# Patient Record
Sex: Female | Born: 2000 | Race: Black or African American | Hispanic: No | Marital: Single | Smoking: Never smoker
Health system: Southern US, Community
[De-identification: ages and names within clinical notes are randomized; demographics above are authoritative.]

## PROBLEM LIST (undated history)

## (undated) ENCOUNTER — Inpatient Hospital Stay (HOSPITAL_COMMUNITY): Payer: Self-pay

## (undated) ENCOUNTER — Ambulatory Visit: Source: Home / Self Care

## (undated) DIAGNOSIS — F32A Depression, unspecified: Secondary | ICD-10-CM

## (undated) DIAGNOSIS — Z789 Other specified health status: Secondary | ICD-10-CM

## (undated) DIAGNOSIS — F329 Major depressive disorder, single episode, unspecified: Secondary | ICD-10-CM

## (undated) DIAGNOSIS — H3090 Unspecified chorioretinal inflammation, unspecified eye: Secondary | ICD-10-CM

## (undated) DIAGNOSIS — O24419 Gestational diabetes mellitus in pregnancy, unspecified control: Secondary | ICD-10-CM

## (undated) DIAGNOSIS — F3181 Bipolar II disorder: Secondary | ICD-10-CM

## (undated) DIAGNOSIS — F419 Anxiety disorder, unspecified: Secondary | ICD-10-CM

## (undated) DIAGNOSIS — D649 Anemia, unspecified: Secondary | ICD-10-CM

## (undated) HISTORY — DX: Anemia, unspecified: D64.9

## (undated) HISTORY — DX: Depression, unspecified: F32.A

## (undated) HISTORY — PX: INDUCED ABORTION: SHX677

## (undated) HISTORY — PX: REFRACTIVE SURGERY: SHX103

## (undated) HISTORY — DX: Unspecified chorioretinal inflammation, unspecified eye: H30.90

## (undated) HISTORY — DX: Gestational diabetes mellitus in pregnancy, unspecified control: O24.419

## (undated) NOTE — *Deleted (*Deleted)
OB/GYN Faculty Practice Delivery Note  Kim Crosby is a 14 y.o. G1P0 s/p induced vaginal at [redacted]w[redacted]d. She was admitted for IOL for A1GDM.   GBS Status:   Maximum Maternal Temperature: Temp (24hrs), Avg:98.7 F (37.1 C), Min:97.5 F (36.4 C), Max:100.8 F (38.2 C)  Labor Progress: . Admitted for induction of labor.  . AROM 5h 74m prior to delivery with light meconium  . Complete dilation achieved.   Delivery Date/Time: 10/31/2019 at 0430 Delivery: Called to room and patient was complete and pushing. Head delivered ***.  No nuchal cord present. . Shoulder and body delivered in usual fashion. Infant with spontaneous cry, placed skin to skin on mother's abdomen, dried and stimulated. Cord clamped x 2 after 1-minute delay, and cut by FOB. Cord blood drawn. Placenta delivered spontaneously with gentle cord traction. Fundus firm with massage and Pitocin. Labia, perineum, vagina, and cervix inspected with bilateral labial. Left labial laceration requiring repair with 4-0 monocryl for hemostasis.  .   Placenta: spontaneous , intact  Complications: maternal temperature, Triple I  EBL: *** mL  Analgesia: Epidural anesthesia  Postpartum Planning Mom and baby to mother/baby.  . Lactation consult . Contraception undecided   . Circ: yes, in hospital   . AM CBG PPD #1  Infant: Viable baby boy  APGAR: 9/9  see delivery summary for infant birthweight  Genia Hotter, M.D.  10/31/2019 4:39 AM

---
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
## 2019-01-14 NOTE — L&D Delivery Note (Signed)
OB/GYN Faculty Practice Delivery Note  Kim Crosby is a 19 y.o. G1P0 s/p induced vaginal at [redacted]w[redacted]d. She was admitted for IOL for A1GDM.   GBS Status:   Maximum Maternal Temperature: Temp (24hrs), Avg:98.7 F (37.1 C), Min:97.5 F (36.4 C), Max:100.8 F (38.2 C)  Labor Progress: . Admitted for induction of labor.  . AROM 5h 16m prior to delivery with light meconium  . Complete dilation achieved.   Delivery Date/Time: 10/31/2019 at 0430 Delivery: Called to room and patient was complete and pushing. Head delivered LOA.  No nuchal cord present. . Shoulder and body delivered in usual fashion. Infant with spontaneous cry, placed skin to skin on mother's abdomen, dried and stimulated. Cord clamped x 2 after 1-minute delay, and cut by FOB. Cord blood drawn. Placenta delivered spontaneously with gentle cord traction. Fundus firm with massage and Pitocin. Labia, perineum, vagina, and cervix inspected with bilateral labial. Left labial laceration requiring repair with 4-0 monocryl for hemostasis.  .   Placenta: spontaneous , intact  Complications: maternal temperature, Triple I  EBL: 150 mL  Analgesia: Epidural anesthesia  Postpartum Planning Mom and baby to mother/baby.  . Lactation consult . Contraception undecided   . Circ: yes, in hospital   . AM CBG PPD #1  Infant: Viable baby boy  APGAR: 9/9  see delivery summary for infant birthweight  Wende Mott, [place] 10/31/19 4:54 AM

## 2019-03-02 ENCOUNTER — Inpatient Hospital Stay (HOSPITAL_COMMUNITY)
Admission: AD | Admit: 2019-03-02 | Discharge: 2019-03-02 | Disposition: A | Discharge status: Home / Self Care | Payer: Self-pay | Attending: Obstetrics and Gynecology | Admitting: Obstetrics and Gynecology

## 2019-03-02 ENCOUNTER — Encounter (HOSPITAL_COMMUNITY): Payer: Self-pay | Admitting: *Deleted

## 2019-03-02 ENCOUNTER — Inpatient Hospital Stay (HOSPITAL_COMMUNITY): Payer: Self-pay

## 2019-03-02 ENCOUNTER — Other Ambulatory Visit: Payer: Self-pay

## 2019-03-02 DIAGNOSIS — O4690 Antepartum hemorrhage, unspecified, unspecified trimester: Secondary | ICD-10-CM

## 2019-03-02 DIAGNOSIS — Z3490 Encounter for supervision of normal pregnancy, unspecified, unspecified trimester: Secondary | ICD-10-CM

## 2019-03-02 DIAGNOSIS — Z6790 Unspecified blood type, Rh positive: Secondary | ICD-10-CM | POA: Insufficient documentation

## 2019-03-02 DIAGNOSIS — D75839 Thrombocytosis, unspecified: Secondary | ICD-10-CM

## 2019-03-02 DIAGNOSIS — D473 Essential (hemorrhagic) thrombocythemia: Secondary | ICD-10-CM

## 2019-03-02 DIAGNOSIS — O468X1 Other antepartum hemorrhage, first trimester: Secondary | ICD-10-CM

## 2019-03-02 DIAGNOSIS — O418X10 Other specified disorders of amniotic fluid and membranes, first trimester, not applicable or unspecified: Secondary | ICD-10-CM

## 2019-03-02 DIAGNOSIS — O99341 Other mental disorders complicating pregnancy, first trimester: Secondary | ICD-10-CM | POA: Insufficient documentation

## 2019-03-02 DIAGNOSIS — O208 Other hemorrhage in early pregnancy: Secondary | ICD-10-CM | POA: Insufficient documentation

## 2019-03-02 DIAGNOSIS — F3181 Bipolar II disorder: Secondary | ICD-10-CM | POA: Insufficient documentation

## 2019-03-02 DIAGNOSIS — Z79899 Other long term (current) drug therapy: Secondary | ICD-10-CM | POA: Insufficient documentation

## 2019-03-02 DIAGNOSIS — R7989 Other specified abnormal findings of blood chemistry: Secondary | ICD-10-CM | POA: Insufficient documentation

## 2019-03-02 DIAGNOSIS — O26891 Other specified pregnancy related conditions, first trimester: Secondary | ICD-10-CM | POA: Insufficient documentation

## 2019-03-02 DIAGNOSIS — O99111 Other diseases of the blood and blood-forming organs and certain disorders involving the immune mechanism complicating pregnancy, first trimester: Secondary | ICD-10-CM | POA: Insufficient documentation

## 2019-03-02 DIAGNOSIS — Z3A01 Less than 8 weeks gestation of pregnancy: Secondary | ICD-10-CM | POA: Insufficient documentation

## 2019-03-02 HISTORY — DX: Other specified health status: Z78.9

## 2019-03-02 HISTORY — DX: Bipolar II disorder: F31.81

## 2019-03-02 HISTORY — DX: Anxiety disorder, unspecified: F41.9

## 2019-03-02 LAB — COMPREHENSIVE METABOLIC PANEL
ALT: 23 U/L (ref 0–44)
AST: 23 U/L (ref 15–41)
Albumin: 3.7 g/dL (ref 3.5–5.0)
Alkaline Phosphatase: 50 U/L (ref 38–126)
Anion gap: 9 (ref 5–15)
BUN: 9 mg/dL (ref 6–20)
CO2: 24 mmol/L (ref 22–32)
Calcium: 9.2 mg/dL (ref 8.9–10.3)
Chloride: 104 mmol/L (ref 98–111)
Creatinine, Ser: 0.61 mg/dL (ref 0.44–1.00)
GFR calc Af Amer: >60 mL/min (ref >60)
GFR calc non Af Amer: >60 mL/min (ref >60)
Glucose, Bld: 85 mg/dL (ref 70–99)
Potassium: 3.7 mmol/L (ref 3.5–5.1)
Sodium: 137 mmol/L (ref 135–145)
Total Bilirubin: 0.5 mg/dL (ref 0.3–1.2)
Total Protein: 6.8 g/dL (ref 6.5–8.1)

## 2019-03-02 LAB — URINALYSIS, ROUTINE W REFLEX MICROSCOPIC
Bilirubin Urine: NEGATIVE
Glucose, UA: NEGATIVE mg/dL
Hgb urine dipstick: NEGATIVE
Ketones, ur: NEGATIVE mg/dL
Leukocytes,Ua: NEGATIVE
Nitrite: NEGATIVE
Protein, ur: NEGATIVE mg/dL
Specific Gravity, Urine: 1.023 (ref 1.005–1.030)
pH: 6 (ref 5.0–8.0)

## 2019-03-02 LAB — CBC
HCT: 37.4 % (ref 36.0–46.0)
Hemoglobin: 12.2 g/dL (ref 12.0–15.0)
MCH: 27.1 pg (ref 26.0–34.0)
MCHC: 32.6 g/dL (ref 30.0–36.0)
MCV: 83.1 fL (ref 80.0–100.0)
Platelets: 410 10*3/uL — ABNORMAL HIGH (ref 150–400)
RBC: 4.5 MIL/uL (ref 3.87–5.11)
RDW: 15.6 % — ABNORMAL HIGH (ref 11.5–15.5)
WBC: 9.6 10*3/uL (ref 4.0–10.5)
nRBC: 0 % (ref 0.0–0.2)

## 2019-03-02 LAB — HCG, QUANTITATIVE, PREGNANCY: hCG, Beta Chain, Quant, S: 19324 m[IU]/mL — ABNORMAL HIGH

## 2019-03-02 LAB — WET PREP, GENITAL
Sperm: NONE SEEN
Trich, Wet Prep: NONE SEEN
Yeast Wet Prep HPF POC: NONE SEEN

## 2019-03-02 LAB — POCT PREGNANCY, URINE: Preg Test, Ur: POSITIVE — AB

## 2019-03-02 LAB — ABO/RH: ABO/RH(D): B POS

## 2019-03-02 NOTE — MAU Provider Note (Signed)
History     CSN: [id number]  Arrival date and time: 03/02/19 1509   First Provider Initiated Contact with Patient 03/02/19 1555      Chief Complaint  Patient presents with  . Vaginal Bleeding  . Abdominal Pain   Ms. Kim Crosby is a 19 y.o. G1P0 at Unknown who presents to MAU for vaginal bleeding which began 3 days ago. Pt describes bleeding as less than her period and only occurred when wiping, but patient reports she is now using a pad, but has a very minimal amount of bleeding on her pad.  Passing blood clots? no Blood soaking clothes? no Lightheaded/dizzy? no Significant pelvic pain or cramping? present, but less than menstrual cramps Passed any tissue? no  Current pregnancy problems? pt has not yet been seen Blood Type? unknown Allergies? NKDA Current medications? none Current PNC & next appt? [facility], NOB 03/16/2019  Pt denies vaginal discharge/odor/itching. Pt denies N/V, abdominal pain, constipation, diarrhea, or urinary problems. Pt denies fever, chills, fatigue, sweating or changes in appetite. Pt denies SOB or chest pain. Pt denies dizziness, HA, light-headedness, weakness.   OB History    Gravida  1   Para      Term      Preterm      AB      Living        SAB      TAB      Ectopic      Multiple      Live Births              Past Medical History:  Diagnosis Date  . Anxiety   . Bipolar 2 disorder ([place])   . Medical history non-contributory     Family History  Problem Relation Age of Onset  . Asthma Father     Social History   Tobacco Use  . Smoking status: Never Smoker  . Smokeless tobacco: Never Used  Substance Use Topics  . Alcohol use: No  . Drug use: Not Currently    Types: Marijuana    Allergies: No Known Allergies  Medications Prior to Admission  Medication Sig Dispense Refill Last Dose  . loratadine (CLARITIN) 10 MG tablet Take 1 tablet (10 mg total) by mouth daily. 90 tablet 3     Review of Systems   Constitutional: Negative for chills, diaphoresis, fatigue and fever.  Eyes: Negative for visual disturbance.  Respiratory: Negative for shortness of breath.   Cardiovascular: Negative for chest pain.  Gastrointestinal: Negative for abdominal pain, constipation, diarrhea, nausea and vomiting.  Genitourinary: Positive for pelvic pain and vaginal bleeding. Negative for dysuria, flank pain, frequency, urgency and vaginal discharge.  Neurological: Negative for dizziness, weakness, light-headedness and headaches.   Physical Exam   Blood pressure 118/61, pulse 100, temperature 98.8 F (37.1 C), temperature source Oral, resp. rate 18, height 5' 3.5" (1.613 m), weight 95.8 kg, last menstrual period 01/24/2019, SpO2 100 %.  Patient Vitals for the past 24 hrs:  BP Temp Temp src Pulse Resp SpO2 Height Weight  03/02/19 1535 118/61 98.8 F (37.1 C) Oral 100 18 100 % -- --  03/02/19 1521 -- -- -- -- -- -- 5' 3.5" (1.613 m) 95.8 kg   Physical Exam  Constitutional: She is oriented to person, place, and time. She appears well-developed and well-nourished. No distress.  HENT:  Head: Normocephalic and atraumatic.  Respiratory: Effort normal.  GI: Soft. She exhibits no distension and no mass. There is no abdominal tenderness.  There is no rebound and no guarding.  Genitourinary: There is no rash, tenderness or lesion on the right labia. There is no rash, tenderness or lesion on the left labia. Uterus is not enlarged and not tender. Cervix exhibits no motion tenderness, no discharge and no friability. Right adnexum displays no mass, no tenderness and no fullness. Left adnexum displays no mass, no tenderness and no fullness.    Vaginal discharge (white, frothy, moderate amount.) present.     No vaginal tenderness or bleeding.  No tenderness or bleeding in the vagina.  Neurological: She is alert and oriented to person, place, and time.  Skin: Skin is warm and dry. She is not diaphoretic.  Psychiatric: She has  a normal mood and affect. Her behavior is normal. Judgment and thought content normal.   Results for orders placed or performed during the hospital encounter of 03/02/19 (from the past 24 hour(s))  Pregnancy, urine POC     Status: Abnormal   Collection Time: 03/02/19  3:40 PM  Result Value Ref Range   Preg Test, Ur POSITIVE (A) NEGATIVE  CBC     Status: Abnormal   Collection Time: 03/02/19  3:54 PM  Result Value Ref Range   WBC 9.6 4.0 - 10.5 K/uL   RBC 4.50 3.87 - 5.11 MIL/uL   Hemoglobin 12.2 12.0 - 15.0 g/dL   HCT 37.4 36.0 - 46.0 %   MCV 83.1 80.0 - 100.0 fL   MCH 27.1 26.0 - 34.0 pg   MCHC 32.6 30.0 - 36.0 g/dL   RDW 15.6 (H) 11.5 - 15.5 %   Platelets 410 (H) 150 - 400 K/uL   nRBC 0.0 0.0 - 0.2 %  Comprehensive metabolic panel     Status: None   Collection Time: 03/02/19  3:54 PM  Result Value Ref Range   Sodium 137 135 - 145 mmol/L   Potassium 3.7 3.5 - 5.1 mmol/L   Chloride 104 98 - 111 mmol/L   CO2 24 22 - 32 mmol/L   Glucose, Bld 85 70 - 99 mg/dL   BUN 9 6 - 20 mg/dL   Creatinine, Ser 0.61 0.44 - 1.00 mg/dL   Calcium 9.2 8.9 - 10.3 mg/dL   Total Protein 6.8 6.5 - 8.1 g/dL   Albumin 3.7 3.5 - 5.0 g/dL   AST 23 15 - 41 U/L   ALT 23 0 - 44 U/L   Alkaline Phosphatase 50 38 - 126 U/L   Total Bilirubin 0.5 0.3 - 1.2 mg/dL   GFR calc non Af Amer >60 >60 mL/min   GFR calc Af Amer >60 >60 mL/min   Anion gap 9 5 - 15  ABO/Rh     Status: None   Collection Time: 03/02/19  3:54 PM  Result Value Ref Range   ABO/RH(D) B POS    No rh immune globuloin      NOT A RH IMMUNE GLOBULIN CANDIDATE, PT RH POSITIVE Performed at [facility], [address], [place], [place] [address]   hCG, quantitative, pregnancy     Status: Abnormal   Collection Time: 03/02/19  3:54 PM  Result Value Ref Range   hCG, Beta Chain, Quant, S 19,324 (H) <5 mIU/mL  Urinalysis, Routine w reflex microscopic     Status: Abnormal   Collection Time: 03/02/19  4:00 PM  Result Value Ref Range   Color,  Urine YELLOW YELLOW   APPearance HAZY (A) CLEAR   Specific Gravity, Urine 1.023 1.005 - 1.030  pH 6.0 5.0 - 8.0   Glucose, UA NEGATIVE NEGATIVE mg/dL   Hgb urine dipstick NEGATIVE NEGATIVE   Bilirubin Urine NEGATIVE NEGATIVE   Ketones, ur NEGATIVE NEGATIVE mg/dL   Protein, ur NEGATIVE NEGATIVE mg/dL   Nitrite NEGATIVE NEGATIVE   Leukocytes,Ua NEGATIVE NEGATIVE  Wet prep, genital     Status: Abnormal   Collection Time: 03/02/19  4:09 PM   Specimen: Cervix  Result Value Ref Range   Yeast Wet Prep HPF POC NONE SEEN NONE SEEN   Trich, Wet Prep NONE SEEN NONE SEEN   Clue Cells Wet Prep HPF POC PRESENT (A) NONE SEEN   WBC, Wet Prep HPF POC MANY (A) NONE SEEN   Sperm NONE SEEN    US OB LESS THAN 14 WEEKS WITH OB TRANSVAGINAL  Result Date: 03/02/2019 CLINICAL DATA:  19 year old female with vaginal bleeding in the 1st trimester of pregnancy. Estimated gestational age by LMP 5 weeks 2 days. Quantitative beta HCG pending. EXAM: OBSTETRIC <14 WK ULTRASOUND TECHNIQUE: Transabdominal ultrasound was performed for evaluation of the gestation as well as the maternal uterus and adnexal regions. COMPARISON:  None. FINDINGS: Intrauterine gestational sac: Single Yolk sac:  Visible Embryo:  Visible Cardiac Activity: Detected Heart Rate: 98 bpm CRL:   2.8 mm   5 w 5 d                  [place] EDC: 10/27/2019 Subchorionic hemorrhage: Small volume of subchorionic hemorrhage (image 72). Maternal uterus/adnexae: The right ovary appears normal measuring 1.9 x 2.9 x 1.8 cm. The left ovary is larger measuring 3.5 x 2.4 by 2.5 cm and probably contains the corpus luteum (image 100). There is a small volume of free fluid adjacent to the right ovary. IMPRESSION: 1. Single living IUP demonstrated. Estimated gestational age by crown rump length 5 weeks 5 days. 2. Small volume subchorionic hemorrhage. Small volume pelvic free fluid. Electronically Signed   By: Genevie Ann M.D.   On: 03/02/2019 17:28    MAU Course   Procedures  MDM -r/o ectopic -UA: hazy, otherwise WNL -CBC: platelets 410 -CMP: WNL -[place]: single IUP, FHR 98, [redacted]w[redacted]d, sm subchorionic hemorrhage -hCG: [id number] -ABO: B Positive -WetPrep: +ClueCells -GC/CT collected -pt discharged to home in stable condition  Orders Placed This Encounter  Procedures  . Wet prep, genital    Standing Status:   Standing    Number of Occurrences:   1  . US OB LESS THAN 14 WEEKS WITH OB TRANSVAGINAL    Standing Status:   Standing    Number of Occurrences:   1    Order Specific Question:   Symptom/Reason for Exam    Answer:   Vaginal bleeding in pregnancy [id number]  . Urinalysis, Routine w reflex microscopic    Standing Status:   Standing    Number of Occurrences:   1  . CBC    Standing Status:   Standing    Number of Occurrences:   1  . Comprehensive metabolic panel    Standing Status:   Standing    Number of Occurrences:   1  . hCG, quantitative, pregnancy    Standing Status:   Standing    Number of Occurrences:   1  . Pregnancy, urine POC    Standing Status:   Standing    Number of Occurrences:   1  . ABO/Rh    Standing Status:   Standing    Number of Occurrences:   1  . Discharge patient  Order Specific Question:   Discharge disposition    Answer:   01-Home or Self Care [1]    Order Specific Question:   Discharge patient date    Answer:   03/02/2019    Assessment and Plan   1. Subchorionic hemorrhage of placenta in first trimester, single or unspecified fetus   2. Vaginal bleeding in pregnancy   3. [redacted] weeks gestation of pregnancy   4. Blood type, Rh positive   5. Intrauterine pregnancy   6. Thrombocytosis (HCC)    Allergies as of 03/02/2019   No Known Allergies     Medication List    TAKE these medications   loratadine 10 MG tablet Commonly known as: CLARITIN Take 1 tablet (10 mg total) by mouth daily.      -discussed normal expectations with [facility] -keep NOB visit -pelvic rest and no heavy lifting advised -return MAU  precautions given -pt discharged to home in stable condition  Elmyra Ricks E Malina Geers 03/02/2019, 5:43 PM

## 2019-03-02 NOTE — MAU Note (Signed)
Presents with c/o VB & abdominal pain that began 3 days ago.  Reports VB is scant/small, no clots.   LMP1/11/2019

## 2019-03-02 NOTE — Discharge Instructions (Signed)
Abdominal Pain During Pregnancy  Abdominal pain is common during pregnancy, and has many possible causes. Some causes are more serious than others, and sometimes the cause is not known. Abdominal pain can be a sign that labor is starting. It can also be caused by normal growth and stretching of muscles and ligaments during pregnancy. Always tell your health care provider if you have any abdominal pain. Follow these instructions at home:  Do not have sex or put anything in your vagina until your pain goes away completely.  Get plenty of rest until your pain improves.  Drink enough fluid to keep your urine pale yellow.  Take over-the-counter and prescription medicines only as told by your health care provider.  Keep all follow-up visits as told by your health care provider. This is important. Contact a health care provider if:  Your pain continues or gets worse after resting.  You have lower abdominal pain that: ? Comes and goes at regular intervals. ? Spreads to your back. ? Is similar to menstrual cramps.  You have pain or burning when you urinate. Get help right away if:  You have a fever or chills.  You have vaginal bleeding.  You are leaking fluid from your vagina.  You are passing tissue from your vagina.  You have vomiting or diarrhea that lasts for more than 24 hours.  Your baby is moving less than usual.  You feel very weak or faint.  You have shortness of breath.  You develop severe pain in your upper abdomen. Summary  Abdominal pain is common during pregnancy, and has many possible causes.  If you experience abdominal pain during pregnancy, tell your health care provider right away.  Follow your health care provider's home care instructions and keep all follow-up visits as directed. This information is not intended to replace advice given to you by your health care provider. Make sure you discuss any questions you have with your health care  provider. Document Revised: 04/19/2018 Document Reviewed: 04/03/2016 Elsevier Patient Education  [place] of Pregnancy The first trimester of pregnancy is from week 1 until the end of week 13 (months 1 through 3). A week after a sperm fertilizes an egg, the egg will implant on the wall of the uterus. This embryo will begin to develop into a baby. Genes from you and your partner will form the baby. The female genes will determine whether the baby will be a boy or a girl. At 6-8 weeks, the eyes and face will be formed, and the heartbeat can be seen on ultrasound. At the end of 12 weeks, all the baby's organs will be formed. Now that you are pregnant, you will want to do everything you can to have a healthy baby. Two of the most important things are to get good prenatal care and to follow your health care provider's instructions. Prenatal care is all the medical care you receive before the baby's birth. This care will help prevent, find, and treat any problems during the pregnancy and childbirth. Body changes during your first trimester Your body goes through many changes during pregnancy. The changes vary from woman to woman.  You may gain or lose a couple of pounds at first.  You may feel sick to your stomach (nauseous) and you may throw up (vomit). If the vomiting is uncontrollable, call your health care provider.  You may tire easily.  You may develop headaches that can be relieved by medicines. All medicines  should be approved by your health care provider.  You may urinate more often. Painful urination may mean you have a bladder infection.  You may develop heartburn as a result of your pregnancy.  You may develop constipation because certain hormones are causing the muscles that push stool through your intestines to slow down.  You may develop hemorrhoids or swollen veins (varicose veins).  Your breasts may begin to grow larger and become tender. Your nipples  may stick out more, and the tissue that surrounds them (areola) may become darker.  Your gums may bleed and may be sensitive to brushing and flossing.  Dark spots or blotches (chloasma, mask of pregnancy) may develop on your face. This will likely fade after the baby is born.  Your menstrual periods will stop.  You may have a loss of appetite.  You may develop cravings for certain kinds of food.  You may have changes in your emotions from day to day, such as being excited to be pregnant or being concerned that something may go wrong with the pregnancy and baby.  You may have more vivid and strange dreams.  You may have changes in your hair. These can include thickening of your hair, rapid growth, and changes in texture. Some women also have hair loss during or after pregnancy, or hair that feels dry or thin. Your hair will most likely return to normal after your baby is born. What to expect at prenatal visits During a routine prenatal visit:  You will be weighed to make sure you and the baby are growing normally.  Your blood pressure will be taken.  Your abdomen will be measured to track your baby's growth.  The fetal heartbeat will be listened to between weeks 10 and 14 of your pregnancy.  Test results from any previous visits will be discussed. Your health care provider may ask you:  How you are feeling.  If you are feeling the baby move.  If you have had any abnormal symptoms, such as leaking fluid, bleeding, severe headaches, or abdominal cramping.  If you are using any tobacco products, including cigarettes, chewing tobacco, and electronic cigarettes.  If you have any questions. Other tests that may be performed during your first trimester include:  Blood tests to find your blood type and to check for the presence of any previous infections. The tests will also be used to check for low iron levels (anemia) and protein on red blood cells (Rh antibodies). Depending on  your risk factors, or if you previously had diabetes during pregnancy, you may have tests to check for high blood sugar that affects pregnant women (gestational diabetes).  Urine tests to check for infections, diabetes, or protein in the urine.  An ultrasound to confirm the proper growth and development of the baby.  Fetal screens for spinal cord problems (spina bifida) and Down syndrome.  HIV (human immunodeficiency virus) testing. Routine prenatal testing includes screening for HIV, unless you choose not to have this test.  You may need other tests to make sure you and the baby are doing well. Follow these instructions at home: Medicines  Follow your health care provider's instructions regarding medicine use. Specific medicines may be either safe or unsafe to take during pregnancy.  Take a prenatal vitamin that contains at least 600 micrograms (mcg) of folic acid.  If you develop constipation, try taking a stool softener if your health care provider approves. Eating and drinking   Eat a balanced diet that includes fresh  fruits and vegetables, whole grains, good sources of protein such as meat, eggs, or tofu, and low-fat dairy. Your health care provider will help you determine the amount of weight gain that is right for you.  Avoid raw meat and uncooked cheese. These carry germs that can cause birth defects in the baby.  Eating four or five small meals rather than three large meals a day may help relieve nausea and vomiting. If you start to feel nauseous, eating a few soda crackers can be helpful. Drinking liquids between meals, instead of during meals, also seems to help ease nausea and vomiting.  Limit foods that are high in fat and processed sugars, such as fried and sweet foods.  To prevent constipation: ? Eat foods that are high in fiber, such as fresh fruits and vegetables, whole grains, and beans. ? Drink enough fluid to keep your urine clear or pale  yellow. Activity  Exercise only as directed by your health care provider. Most women can continue their usual exercise routine during pregnancy. Try to exercise for 30 minutes at least 5 days a week. Exercising will help you: ? Control your weight. ? Stay in shape. ? Be prepared for labor and delivery.  Experiencing pain or cramping in the lower abdomen or lower back is a good sign that you should stop exercising. Check with your health care provider before continuing with normal exercises.  Try to avoid standing for long periods of time. Move your legs often if you must stand in one place for a long time.  Avoid heavy lifting.  Wear low-heeled shoes and practice good posture.  You may continue to have sex unless your health care provider tells you not to. Relieving pain and discomfort  Wear a good support bra to relieve breast tenderness.  Take warm sitz baths to soothe any pain or discomfort caused by hemorrhoids. Use hemorrhoid cream if your health care provider approves.  Rest with your legs elevated if you have leg cramps or low back pain.  If you develop varicose veins in your legs, wear support hose. Elevate your feet for 15 minutes, 3-4 times a day. Limit salt in your diet. Prenatal care  Schedule your prenatal visits by the twelfth week of pregnancy. They are usually scheduled monthly at first, then more often in the last 2 months before delivery.  Write down your questions. Take them to your prenatal visits.  Keep all your prenatal visits as told by your health care provider. This is important. Safety  Wear your seat belt at all times when driving.  Make a list of emergency phone numbers, including numbers for family, friends, the hospital, and police and fire departments. General instructions  Ask your health care provider for a referral to a local prenatal education class. Begin classes no later than the beginning of month 6 of your pregnancy.  Ask for help if  you have counseling or nutritional needs during pregnancy. Your health care provider can offer advice or refer you to specialists for help with various needs.  Do not use hot tubs, steam rooms, or saunas.  Do not douche or use tampons or scented sanitary pads.  Do not cross your legs for long periods of time.  Avoid cat litter boxes and soil used by cats. These carry germs that can cause birth defects in the baby and possibly loss of the fetus by miscarriage or stillbirth.  Avoid all smoking, herbs, alcohol, and medicines not prescribed by your health care provider. Chemicals  in these products affect the formation and growth of the baby.  Do not use any products that contain nicotine or tobacco, such as cigarettes and e-cigarettes. If you need help quitting, ask your health care provider. You may receive counseling support and other resources to help you quit.  Schedule a dentist appointment. At home, brush your teeth with a soft toothbrush and be gentle when you floss. Contact a health care provider if:  You have dizziness.  You have mild pelvic cramps, pelvic pressure, or nagging pain in the abdominal area.  You have persistent nausea, vomiting, or diarrhea.  You have a bad smelling vaginal discharge.  You have pain when you urinate.  You notice increased swelling in your face, hands, legs, or ankles.  You are exposed to fifth disease or chickenpox.  You are exposed to [place] measles (rubella) and have never had it. Get help right away if:  You have a fever.  You are leaking fluid from your vagina.  You have spotting or bleeding from your vagina.  You have severe abdominal cramping or pain.  You have rapid weight gain or loss.  You vomit blood or material that looks like coffee grounds.  You develop a severe headache.  You have shortness of breath.  You have any kind of trauma, such as from a fall or a car accident. Summary  The first trimester of pregnancy is  from week 1 until the end of week 13 (months 1 through 3).  Your body goes through many changes during pregnancy. The changes vary from woman to woman.  You will have routine prenatal visits. During those visits, your health care provider will examine you, discuss any test results you may have, and talk with you about how you are feeling. This information is not intended to replace advice given to you by your health care provider. Make sure you discuss any questions you have with your health care provider. Document Revised: 12/12/2016 Document Reviewed: 12/12/2015 Elsevier Patient Education  2020 [place].  Vaginal Bleeding During Pregnancy, First Trimester  A small amount of bleeding from the vagina (spotting) is relatively common during early pregnancy. It usually stops on its own. Various things may cause bleeding or spotting during early pregnancy. Some bleeding may be related to the pregnancy, and some may not. In many cases, the bleeding is normal and is not a problem. However, bleeding can also be a sign of something serious. Be sure to tell your health care provider about any vaginal bleeding right away. Some possible causes of vaginal bleeding during the first trimester include:  Infection or inflammation of the cervix.  Growths (polyps) on the cervix.  Miscarriage or threatened miscarriage.  Pregnancy tissue developing outside of the uterus (ectopic pregnancy).  A mass of tissue developing in the uterus due to an egg being fertilized incorrectly (molar pregnancy). Follow these instructions at home: Activity  Follow instructions from your health care provider about limiting your activity. Ask what activities are safe for you.  If needed, make plans for someone to help with your regular activities.  Do not have sex or orgasms until your health care provider says that this is safe. General instructions  Take over-the-counter and prescription medicines only as told by your  health care provider.  Pay attention to any changes in your symptoms.  Do not use tampons or douche.  Write down how many pads you use each day, how often you change pads, and how soaked (saturated) they are.  If you pass any tissue from your vagina, save the tissue so you can show it to your health care provider.  Keep all follow-up visits as told by your health care provider. This is important. Contact a health care provider if:  You have vaginal bleeding during any part of your pregnancy.  You have cramps or labor pains.  You have a fever. Get help right away if:  You have severe cramps in your back or abdomen.  You pass large clots or a large amount of tissue from your vagina.  Your bleeding increases.  You feel light-headed or weak, or you faint.  You have chills.  You are leaking fluid or have a gush of fluid from your vagina. Summary  A small amount of bleeding (spotting) from the vagina is relatively common during early pregnancy.  Various things may cause bleeding or spotting in early pregnancy.  Be sure to tell your health care provider about any vaginal bleeding right away. This information is not intended to replace advice given to you by your health care provider. Make sure you discuss any questions you have with your health care provider. Document Revised: 04/20/2018 Document Reviewed: 04/03/2016 Elsevier Patient Education  [place] Hematoma  A subchorionic hematoma is a gathering of blood between the outer wall of the embryo (chorion) and the inner wall of the womb (uterus). This condition can cause vaginal bleeding. If they cause little or no vaginal bleeding, early small hematomas usually shrink on their own and do not affect your baby or pregnancy. When bleeding starts later in pregnancy, or if the hematoma is larger or occurs in older pregnant women, the condition may be more serious. Larger hematomas may get bigger, which  increases the chances of miscarriage. This condition also increases the risk of:  Premature separation of the placenta from the uterus.  Premature (preterm) labor.  Stillbirth. What are the causes? The exact cause of this condition is not known. It occurs when blood is trapped between the placenta and the uterine wall because the placenta has separated from the original site of implantation. What increases the risk? You are more likely to develop this condition if:  You were treated with fertility medicines.  You conceived through in vitro fertilization (IVF). What are the signs or symptoms? Symptoms of this condition include:  Vaginal spotting or bleeding.  Contractions of the uterus. These cause abdominal pain. Sometimes you may have no symptoms and the bleeding may only be seen when ultrasound images are taken (transvaginal ultrasound). How is this diagnosed? This condition is diagnosed based on a physical exam. This includes a pelvic exam. You may also have other tests, including:  Blood tests.  Urine tests.  Ultrasound of the abdomen. How is this treated? Treatment for this condition can vary. Treatment may include:  Watchful waiting. You will be monitored closely for any changes in bleeding. During this stage: ? The hematoma may be reabsorbed by the body. ? The hematoma may separate the fluid-filled space containing the embryo (gestational sac) from the wall of the womb (endometrium).  Medicines.  Activity restriction. This may be needed until the bleeding stops. Follow these instructions at home:  Stay on bed rest if told to do so by your health care provider.  Do not lift anything that is heavier than 10 lbs. (4.5 kg) or as told by your health care provider.  Do not use any products that contain nicotine or tobacco, such as cigarettes and e-cigarettes.  If you need help quitting, ask your health care provider.  Track and write down the number of pads you use  each day and how soaked (saturated) they are.  Do not use tampons.  Keep all follow-up visits as told by your health care provider. This is important. Your health care provider may ask you to have follow-up blood tests or ultrasound tests or both. Contact a health care provider if:  You have any vaginal bleeding.  You have a fever. Get help right away if:  You have severe cramps in your stomach, back, abdomen, or pelvis.  You pass large clots or tissue. Save any tissue for your health care provider to look at.  You have more vaginal bleeding, and you faint or become lightheaded or weak. Summary  A subchorionic hematoma is a gathering of blood between the outer wall of the placenta and the uterus.  This condition can cause vaginal bleeding.  Sometimes you may have no symptoms and the bleeding may only be seen when ultrasound images are taken.  Treatment may include watchful waiting, medicines, or activity restriction. This information is not intended to replace advice given to you by your health care provider. Make sure you discuss any questions you have with your health care provider. Document Revised: 12/12/2016 Document Reviewed: 02/26/2016 Elsevier Patient Education  [place] Medications in Pregnancy    Acne: Benzoyl Peroxide Salicylic Acid  Backache/Headache: Tylenol: 2 regular strength every 4 hours OR              2 Extra strength every 6 hours  Colds/Coughs/Allergies: Benadryl (alcohol free) 25 mg every 6 hours as needed Breath right strips Claritin Cepacol throat lozenges Chloraseptic throat spray Cold-Eeze- up to three times per day Cough drops, alcohol free Flonase (by prescription only) Guaifenesin Mucinex Robitussin DM (plain only, alcohol free) Saline nasal spray/drops Sudafed (pseudoephedrine) & Actifed ** use only after [redacted] weeks gestation and if you do not have high blood pressure Tylenol Vicks Vaporub Zinc  lozenges Zyrtec   Constipation: Colace Ducolax suppositories Fleet enema Glycerin suppositories Metamucil Milk of magnesia Miralax Senokot Smooth move tea  Diarrhea: Kaopectate Imodium A-D  *NO pepto Bismol  Hemorrhoids: Anusol Anusol HC Preparation H Tucks  Indigestion: Tums Maalox Mylanta Zantac  Pepcid  Insomnia: Benadryl (alcohol free) 25mg  every 6 hours as needed Tylenol PM Unisom, no Gelcaps  Leg Cramps: Tums MagGel  Nausea/Vomiting:  Bonine Dramamine Emetrol Ginger extract Sea bands Meclizine  Nausea medication to take during pregnancy:  Unisom (doxylamine succinate 25 mg tablets) Take one tablet daily at bedtime. If symptoms are not adequately controlled, the dose can be increased to a maximum recommended dose of two tablets daily (1/2 tablet in the morning, 1/2 tablet mid-afternoon and one at bedtime). Vitamin B6 100mg  tablets. Take one tablet twice a day (up to 200 mg per day).  Skin Rashes: Aveeno products Benadryl cream or 25mg  every 6 hours as needed Calamine Lotion 1% cortisone cream  Yeast infection: Gyne-lotrimin 7 Monistat 7   **If taking multiple medications, please check labels to avoid duplicating the same active ingredients **take medication as directed on the label ** Do not exceed 4000 mg of tylenol in 24 hours **Do not take medications that contain aspirin or ibuprofen

## 2019-03-03 LAB — GC/CHLAMYDIA PROBE AMP (~~LOC~~) NOT AT ARMC
Chlamydia: NEGATIVE
Comment: NEGATIVE
Comment: NORMAL
Neisseria Gonorrhea: NEGATIVE

## 2019-03-16 LAB — HIV ANTIBODY (ROUTINE TESTING W REFLEX): HIV Screen 4th Generation wRfx: NONREACTIVE

## 2019-03-16 LAB — OB RESULTS CONSOLE HGB/HCT, BLOOD
HCT: 40 (ref 29–41)
Hemoglobin: 13.2

## 2019-03-16 LAB — OB RESULTS CONSOLE RPR: RPR: NONREACTIVE

## 2019-03-16 LAB — OB RESULTS CONSOLE ABO/RH: RH Type: POSITIVE

## 2019-03-16 LAB — OB RESULTS CONSOLE PLATELET COUNT: Platelets: 462

## 2019-03-16 LAB — OB RESULTS CONSOLE HIV ANTIBODY (ROUTINE TESTING): HIV: NONREACTIVE

## 2019-03-16 LAB — OB RESULTS CONSOLE RUBELLA ANTIBODY, IGM: Rubella: IMMUNE

## 2019-03-16 LAB — OB RESULTS CONSOLE HEPATITIS B SURFACE ANTIGEN: Hepatitis B Surface Ag: NEGATIVE

## 2019-03-16 LAB — OB RESULTS CONSOLE ANTIBODY SCREEN: Antibody Screen: NEGATIVE

## 2019-04-12 ENCOUNTER — Encounter: Payer: Self-pay | Admitting: General Practice

## 2019-04-26 ENCOUNTER — Encounter: Payer: Self-pay | Admitting: Advanced Practice Midwife

## 2019-05-04 ENCOUNTER — Encounter: Payer: Self-pay | Admitting: Advanced Practice Midwife

## 2019-05-04 ENCOUNTER — Ambulatory Visit (INDEPENDENT_AMBULATORY_CARE_PROVIDER_SITE_OTHER): Payer: BC Managed Care – PPO | Admitting: Advanced Practice Midwife

## 2019-05-04 ENCOUNTER — Other Ambulatory Visit: Payer: Self-pay

## 2019-05-04 DIAGNOSIS — Z3400 Encounter for supervision of normal first pregnancy, unspecified trimester: Secondary | ICD-10-CM | POA: Insufficient documentation

## 2019-05-04 NOTE — Patient Instructions (Signed)
Ivin Booty homebirth midwife  [email or web address]  Email [email or web address] Phone [phone] ext. [phone]   Considering Waterbirth? Guide for patients at [facility]  Why consider waterbirth?  . Gentle birth for babies . Less pain medicine used in labor . May allow for passive descent/less pushing . May reduce perineal tears  . More mobility and instinctive maternal position changes . Increased maternal relaxation . Reduced blood pressure in labor  Is waterbirth safe? What are the risks of infection, drowning or other complications?  . Infection: o Very low risk (3.7 % for tub vs 4.8% for bed) o 7 in 8000 waterbirths with documented infection o Poorly cleaned equipment most common cause o Slightly lower group B strep transmission rate  . Drowning o Maternal:  - Very low risk   - Related to seizures or fainting o Newborn:  - Very low risk. No evidence of increased risk of respiratory problems in multiple large studies - Physiological protection from breathing under water - Avoid underwater birth if there are any fetal complications - Once baby's head is out of the water, keep it out.  . Birth complication o Some reports of cord trauma, but risk decreased by bringing baby to surface gradually o No evidence of increased risk of shoulder dystocia. Mothers can usually change positions faster in water than in a bed, possibly aiding the maneuvers to free the shoulder.   You must attend a Doren Custard class at [facility]  3rd Wednesday of every month from 7-9pm  [employer] by calling [phone] or online at [email or web address]  Bring [place] the certificate from the class to your prenatal appointment  Meet with a midwife at 36 weeks to see if you can still plan a waterbirth and to sign the consent.   If you plan a waterbirth at [facility] at [facility], you will need to purchase the  following:  Fish net  Bathing suit top (optional)  Long-handled mirror (optional)   Things that would prevent you from having a waterbirth:  Premature, <37wks  Previous cesarean birth  Presence of thick meconium-stained fluid  Multiple gestation (Twins, triplets, etc.)  Uncontrolled diabetes or gestational diabetes requiring medication  Hypertension requiring medication or diagnosis of pre-eclampsia  Heavy vaginal bleeding  Non-reassuring fetal heart rate  Active infection (MRSA, etc.). Group B Strep is NOT a contraindication for waterbirth.  If your labor has to be induced and induction method requires continuous monitoring of the baby's heart rate  Other risks/issues identified by your obstetrical provider  Please remember that birth is unpredictable. Under certain unforeseeable circumstances your provider may advise against giving birth in the tub. These decisions will be made on a case-by-case basis and with the safety of you and your baby as our highest priority.  DOULA LIST   [facility] Doula  [place]  [phone]  [email or web address]  [email or web address]  [place] [phone]  [email or web address]   Precious [place]   [email or web address]   ??THE MOTHERLY DOULA?? Lajuana Ripple   [phone]   [email or web address]     [facility]und[facility] Life Doula  Mattie Marlin  [phone]    [email or web address]   [facility]  Angie Rosier     [phone]     [email or web address]   Lenoria Farrier: [place] [phone]       [email or web address]  [email or web address]   [place]  [phone]   [email or web address]   [place], [place]  Llana Aliment  [phone]  [email or web address]    [email or web address]   [facility]   Burney Gauze   [phone]  [email or web address]  [phone] [email or web address]  Natural Baby [facility]     [phone] [email or web address]  [email or web address]   [facility]   [place] [phone] [email or web address]   [facility] Doula Services  Abbie Sons     [phone]  [email or web address]  [facility]     [phone]  [email or web address]  Facebook and IG @[email or web address] .Vivia Birmingham  [phone] [email or web address] .Dola Argyle [phone] [email or web address]   Delanna Ahmadi  [phone] [email or web address]     [facility]  [phone] [email or web address]   IG [email or web address]    Henrine Screws    [phone] [email or web address]   Rachel Moulds "[place]" Free  [phone] [email or web address]    Mtende Roll  [phone] [email or web address]   Susie Williams   [email or web address]    Crissie Reese    [phone] [email or web address]     Carlean Jews  [phone] [email or web address] .Dillard Cannon  [phone] [email or web address]    [place] Rhem    [phone]   [facility] Karie Mainland  [phone] [email or web address]  Doula Mama Hollie [place] [phone] [email or web address]

## 2019-05-04 NOTE — Progress Notes (Signed)
  Subjective:   Kim Crosby is a 19 y.o. G1P0 at [redacted]w[redacted]d by early ultrasound being seen today for her first obstetrical visit.  Her obstetrical history is significant for none. Patient does intend to breast feed. Pregnancy history fully reviewed.  Patient reports fatigue and hot flashes, vivid dreams.  HISTORY: OB History  Gravida Para Term Preterm AB Living  1 0 0 0 0 0  SAB TAB Ectopic Multiple Live Births  0 0 0 0 0    # Outcome Date GA Lbr Len/2nd Weight Sex Delivery Anes PTL Lv  1 Current             Last pap smear was done NA,age and was NA, age   Past Medical History:  Diagnosis Date  . Anxiety   . Bipolar 2 disorder ([place])   . Depression   . Medical history non-contributory    Past Surgical History:  Procedure Laterality Date  . REFRACTIVE SURGERY Bilateral    Family History  Problem Relation Age of Onset  . Asthma Father   . Diabetes Maternal Grandmother   . Kidney disease Maternal Grandmother    Social History   Tobacco Use  . Smoking status: Never Smoker  . Smokeless tobacco: Never Used  Substance Use Topics  . Alcohol use: No  . Drug use: Not Currently    Types: Marijuana   No Known Allergies No current outpatient medications on file prior to visit.   No current facility-administered medications on file prior to visit.    Review of Systems Pertinent items noted in HPI and remainder of comprehensive ROS otherwise negative.  Exam   Vitals:   05/04/19 0836  BP: 132/80  Pulse: (!) 101  Temp: 98.1 F (36.7 C)  Weight: 209 lb 3.2 oz (94.9 kg)   Fetal Heart Rate (bpm): 156  Physical Exam  Constitutional: She is oriented to person, place, and time and well-developed, well-nourished, and in no distress. No distress.  HENT:  Head: Normocephalic.  Cardiovascular: Normal rate.  Pulmonary/Chest: Effort normal.  Abdominal: Soft. There is no abdominal tenderness. There is no rebound.  Neurological: She is alert and oriented to person, place, and  time.  Skin: Skin is warm and dry.  Psychiatric: Affect normal.  Nursing note and vitals reviewed.    Assessment:   Pregnancy: G1P0 Patient Active Problem List   Diagnosis Date Noted  . Supervision of normal first pregnancy, antepartum 05/04/2019  . Tinnitus of both ears 08/05/2012  . Childhood obesity, BMI 95-100 percentile 03/29/2009     Plan:  1. Supervision of normal first pregnancy, antepartum - Genetic Screening - Enroll Patient in Babyscripts - [place] MFM OB COMP + 14 WK; Future - AFP, Serum, Open Spina Bifida   LMP and [place] EDC had a 6 day discrepancy at 8 weeks 1 day. EDC updated to use ultrasound EDC as this meets criteria (>5 days)  Initial labs drawn. Continue prenatal vitamins. Genetic Screening discussed, AFP and NIPS: requested. Ultrasound discussed; fetal anatomic survey: requested. Problem list reviewed and updated. The nature of [place] with multiple MDs and other Advanced Practice Providers was explained to patient; also emphasized that residents, students are part of our team. Routine obstetric precautions reviewed. 50% of 45 min visit spent in counseling and coordination of care. Return in about 4 weeks (around 06/01/2019) for virtutal visit .   Marcille Buffy DNP, CNM  05/04/19  8:54 AM

## 2019-05-06 ENCOUNTER — Encounter: Payer: Self-pay | Admitting: General Practice

## 2019-05-06 LAB — AFP, SERUM, OPEN SPINA BIFIDA
AFP MoM: 0.84
AFP Value: 22.9 ng/mL
Gest. Age on Collection Date: 15.1 weeks
Maternal Age At EDD: 21.3 yr
OSBR Risk 1 IN: 10000
Test Results:: NEGATIVE
Weight: 209 [lb_av]

## 2019-05-11 ENCOUNTER — Encounter: Payer: Self-pay | Admitting: General Practice

## 2019-05-12 ENCOUNTER — Encounter: Payer: Self-pay | Admitting: General Practice

## 2019-05-31 ENCOUNTER — Ambulatory Visit: Payer: BC Managed Care – PPO | Attending: Advanced Practice Midwife

## 2019-05-31 ENCOUNTER — Other Ambulatory Visit: Payer: Self-pay

## 2019-05-31 ENCOUNTER — Other Ambulatory Visit: Payer: Self-pay | Admitting: *Deleted

## 2019-05-31 DIAGNOSIS — O99212 Obesity complicating pregnancy, second trimester: Secondary | ICD-10-CM | POA: Diagnosis not present

## 2019-05-31 DIAGNOSIS — Z363 Encounter for antenatal screening for malformations: Secondary | ICD-10-CM

## 2019-05-31 DIAGNOSIS — O359XX0 Maternal care for (suspected) fetal abnormality and damage, unspecified, not applicable or unspecified: Secondary | ICD-10-CM | POA: Diagnosis not present

## 2019-05-31 DIAGNOSIS — Z3400 Encounter for supervision of normal first pregnancy, unspecified trimester: Secondary | ICD-10-CM | POA: Diagnosis not present

## 2019-05-31 DIAGNOSIS — E669 Obesity, unspecified: Secondary | ICD-10-CM | POA: Diagnosis not present

## 2019-05-31 DIAGNOSIS — Z3A18 18 weeks gestation of pregnancy: Secondary | ICD-10-CM

## 2019-06-01 ENCOUNTER — Telehealth (INDEPENDENT_AMBULATORY_CARE_PROVIDER_SITE_OTHER): Payer: BC Managed Care – PPO | Admitting: Obstetrics and Gynecology

## 2019-06-01 DIAGNOSIS — Z3400 Encounter for supervision of normal first pregnancy, unspecified trimester: Secondary | ICD-10-CM

## 2019-06-01 DIAGNOSIS — Z3402 Encounter for supervision of normal first pregnancy, second trimester: Secondary | ICD-10-CM | POA: Diagnosis not present

## 2019-06-01 DIAGNOSIS — Z3A18 18 weeks gestation of pregnancy: Secondary | ICD-10-CM | POA: Diagnosis not present

## 2019-06-01 NOTE — Patient Instructions (Signed)
Glucose Tolerance Test During Pregnancy Why am I having this test? The glucose tolerance test (GTT) is done to check how your body processes sugar (glucose). This is one of several tests used to diagnose diabetes that develops during pregnancy (gestational diabetes mellitus). Gestational diabetes is a temporary form of diabetes that some women develop during pregnancy. It usually occurs during the second trimester of pregnancy and goes away after delivery. Testing (screening) for gestational diabetes usually occurs between 24 and 28 weeks of pregnancy. You may have the GTT test after having a 1-hour glucose screening test if the results from that test indicate that you may have gestational diabetes. You may also have this test if:  You have a history of gestational diabetes.  You have a history of giving birth to very large babies or have experienced repeated fetal loss (stillbirth).  You have signs and symptoms of diabetes, such as: ? Changes in your vision. ? Tingling or numbness in your hands or feet. ? Changes in hunger, thirst, and urination that are not otherwise explained by your pregnancy. What is being tested? This test measures the amount of glucose in your blood at different times during a period of 3 hours. This indicates how well your body is able to process glucose. What kind of sample is taken?  Blood samples are required for this test. They are usually collected by inserting a needle into a blood vessel. How do I prepare for this test?  For 3 days before your test, eat normally. Have plenty of carbohydrate-rich foods.  Follow instructions from your health care provider about: ? Eating or drinking restrictions on the day of the test. You may be asked to not eat or drink anything other than water (fast) starting 8-10 hours before the test. ? Changing or stopping your regular medicines. Some medicines may interfere with this test. Tell a health care provider about:  All  medicines you are taking, including vitamins, herbs, eye drops, creams, and over-the-counter medicines.  Any blood disorders you have.  Any surgeries you have had.  Any medical conditions you have. What happens during the test? First, your blood glucose will be measured. This is referred to as your fasting blood glucose, since you fasted before the test. Then, you will drink a glucose solution that contains a certain amount of glucose. Your blood glucose will be measured again 1, 2, and 3 hours after drinking the solution. This test takes about 3 hours to complete. You will need to stay at the testing location during this time. During the testing period:  Do not eat or drink anything other than the glucose solution.  Do not exercise.  Do not use any products that contain nicotine or tobacco, such as cigarettes and e-cigarettes. If you need help stopping, ask your health care provider. The testing procedure may vary among health care providers and hospitals. How are the results reported? Your results will be reported as milligrams of glucose per deciliter of blood (mg/dL) or millimoles per liter (mmol/L). Your health care provider will compare your results to normal ranges that were established after testing a large group of people (reference ranges). Reference ranges may vary among labs and hospitals. For this test, common reference ranges are:  Fasting: less than 95-105 mg/dL (5.3-5.8 mmol/L).  1 hour after drinking glucose: less than 180-190 mg/dL (10.0-10.5 mmol/L).  2 hours after drinking glucose: less than 155-165 mg/dL (8.6-9.2 mmol/L).  3 hours after drinking glucose: 140-145 mg/dL (7.8-8.1 mmol/L). What do the   results mean? Results within reference ranges are considered normal, meaning that your glucose levels are well-controlled. If two or more of your blood glucose levels are high, you may be diagnosed with gestational diabetes. If only one level is high, your health care  provider may suggest repeat testing or other tests to confirm a diagnosis. Talk with your health care provider about what your results mean. Questions to ask your health care provider Ask your health care provider, or the department that is doing the test:  When will my results be ready?  How will I get my results?  What are my treatment options?  What other tests do I need?  What are my next steps? Summary  The glucose tolerance test (GTT) is one of several tests used to diagnose diabetes that develops during pregnancy (gestational diabetes mellitus). Gestational diabetes is a temporary form of diabetes that some women develop during pregnancy.  You may have the GTT test after having a 1-hour glucose screening test if the results from that test indicate that you may have gestational diabetes. You may also have this test if you have any symptoms or risk factors for gestational diabetes.  Talk with your health care provider about what your results mean. This information is not intended to replace advice given to you by your health care provider. Make sure you discuss any questions you have with your health care provider. Document Revised: 04/22/2018 Document Reviewed: 08/11/2016 Elsevier Patient Education  2020 Elsevier Inc.  

## 2019-06-01 NOTE — Progress Notes (Signed)
   MY CHART VIDEO VIRTUAL OBSTETRICS VISIT ENCOUNTER NOTE  I connected with [name] on 06/01/19 at  2:10 PM EDT by My Chart video at home and verified that I am speaking with the correct person using two identifiers. Provider located at center for [facility] at [place].   I discussed the limitations, risks, security and privacy concerns of performing an evaluation and management service by My Chart video and the availability of in person appointments. I also discussed with the patient that there may be a patient responsible charge related to this service. The patient expressed understanding and agreed to proceed.  Subjective:  Kim Crosby is a 19 y.o. G1P0 at [redacted]w[redacted]d being followed for ongoing prenatal care.  She is currently monitored for the following issues for this low-risk pregnancy and has Childhood obesity, BMI 95-100 percentile; Tinnitus of both ears; and Supervision of normal first pregnancy, antepartum on their problem list.  Patient reports no complaints. Wants to discuss U/S results Reports fetal movement. Denies any contractions, bleeding or leaking of fluid.   The following portions of the patient's history were reviewed and updated as appropriate: allergies, current medications, past family history, past medical history, past social history, past surgical history and problem list.   Objective:   General:  Alert, oriented and cooperative.   Mental Status: Normal mood and affect perceived. Normal judgment and thought content.  Rest of physical exam deferred due to type of encounter  BP 120/73   Pulse 100   Wt 222 lb 3.2 oz (100.8 kg)   LMP 01/24/2019   BMI 38.74 kg/m  **Done by patient's own at home BP cuff and scale  Assessment and Plan:  Pregnancy: G1P0 at [redacted]w[redacted]d  1. Supervision of normal first pregnancy, antepartum - Teaching on how to take blood pressure given at the time of visit today - Discussed no NB on U/S. Patient declined amniocentesis when  offered by MFM - Anticipatory guidance for next visit MyChart in 4 weeks  Preterm labor symptoms and general obstetric precautions including but not limited to vaginal bleeding, contractions, leaking of fluid and fetal movement were reviewed in detail with the patient.  I discussed the assessment and treatment plan with the patient. The patient was provided an opportunity to ask questions and all were answered. The patient agreed with the plan and demonstrated an understanding of the instructions. The patient was advised to call back or seek an in-person office evaluation/go to [facility]U at [facility] for any urgent or concerning symptoms. Please refer to After Visit Summary for other counseling recommendations.   I provided 5 minutes of non-face-to-face time during this encounter. There was 5 minutes of chart review time spent prior to this encounter. Total time spent = 10 minutes.  Return in about 4 weeks (around 06/29/2019) for Return OB - My Chart video.  Future Appointments  Date Time Provider [place]  06/28/2019 11:00 AM [facility] US1 [facility] [place], [place] for [employer], [place]

## 2019-06-07 ENCOUNTER — Encounter: Payer: Self-pay | Admitting: Obstetrics and Gynecology

## 2019-06-28 ENCOUNTER — Other Ambulatory Visit: Payer: Self-pay

## 2019-06-28 ENCOUNTER — Other Ambulatory Visit: Payer: Self-pay | Admitting: *Deleted

## 2019-06-28 ENCOUNTER — Ambulatory Visit: Payer: BC Managed Care – PPO | Attending: Obstetrics and Gynecology

## 2019-06-28 DIAGNOSIS — O219 Vomiting of pregnancy, unspecified: Secondary | ICD-10-CM

## 2019-06-28 DIAGNOSIS — Z3A22 22 weeks gestation of pregnancy: Secondary | ICD-10-CM

## 2019-06-28 DIAGNOSIS — O99212 Obesity complicating pregnancy, second trimester: Secondary | ICD-10-CM | POA: Diagnosis present

## 2019-06-28 DIAGNOSIS — E669 Obesity, unspecified: Secondary | ICD-10-CM | POA: Diagnosis not present

## 2019-06-28 DIAGNOSIS — O359XX0 Maternal care for (suspected) fetal abnormality and damage, unspecified, not applicable or unspecified: Secondary | ICD-10-CM

## 2019-06-28 DIAGNOSIS — Z362 Encounter for other antenatal screening follow-up: Secondary | ICD-10-CM

## 2019-06-29 ENCOUNTER — Telehealth (INDEPENDENT_AMBULATORY_CARE_PROVIDER_SITE_OTHER): Payer: BC Managed Care – PPO | Admitting: Obstetrics and Gynecology

## 2019-06-29 DIAGNOSIS — Z3402 Encounter for supervision of normal first pregnancy, second trimester: Secondary | ICD-10-CM

## 2019-06-29 DIAGNOSIS — Z3400 Encounter for supervision of normal first pregnancy, unspecified trimester: Secondary | ICD-10-CM

## 2019-06-29 NOTE — Progress Notes (Signed)
   MY CHART VIDEO VIRTUAL OBSTETRICS VISIT ENCOUNTER NOTE  I connected with [place] on 06/29/19 at 11:10 AM EDT by My Chart video at home and verified that I am speaking with the correct person using two identifiers. Provider located at [employer] for [employer] [facility] at [place].   I discussed the limitations, risks, security and privacy concerns of performing an evaluation and management service by My Chart video and the availability of in person appointments. I also discussed with the patient that there may be a patient responsible charge related to this service. The patient expressed understanding and agreed to proceed.  Subjective:  Kim Crosby is a 19 y.o. G1P0 at [redacted]w[redacted]d being followed for ongoing prenatal care.  She is currently monitored for the following issues for this low-risk pregnancy and has Childhood obesity, BMI 95-100 percentile; Tinnitus of both ears; and Supervision of normal first pregnancy, antepartum on their problem list.  Patient reports nausea and dizziness in the middle of night, but not last night. She also complains of DFM, but found out at U/S esterday that the baby was doing well. She wants to know when she will feel more FM.Marland Kitchen Reports fetal movement. Denies any contractions, bleeding or leaking of fluid.   The following portions of the patient's history were reviewed and updated as appropriate: allergies, current medications, past family history, past medical history, past social history, past surgical history and problem list.   Objective:   General:  Alert, oriented and cooperative.   Mental Status: Normal mood and affect perceived. Normal judgment and thought content.  Rest of physical exam deferred due to type of encounter  BP 114/75   Pulse (!) 106   Wt 229 lb 6.4 oz (104.1 kg)   LMP 01/24/2019   BMI 40.00 kg/m  **Done by patient's own at home BP cuff and scale  Assessment and Plan:  Pregnancy: G1P0 at [redacted]w[redacted]d  1. Supervision of normal first  pregnancy, antepartum - Discussed anterior placenta makes FM felt less when baby is smaller. - Reassurance of when the baby grows big enough she will feel more FM around the placental edges. - Patient expressed interest in waterbirth and also homebirth; requesting contact information for homebirth midwife. Will send via [facility] message.  Preterm labor symptoms and general obstetric precautions including but not limited to vaginal bleeding, contractions, leaking of fluid and fetal movement were reviewed in detail with the patient.  I discussed the assessment and treatment plan with the patient. The patient was provided an opportunity to ask questions and all were answered. The patient agreed with the plan and demonstrated an understanding of the instructions. The patient was advised to call back or seek an in-person office evaluation/go to MAU at [facility] for any urgent or concerning symptoms. Please refer to After Visit Summary for other counseling recommendations.   I provided 10 minutes of non-face-to-face time during this encounter. There was 5 minutes of chart review time spent prior to this encounter. Total time spent = 15 minutes.  Return in about 4 weeks (around 07/27/2019) for Return OB - My Chart video.  Future Appointments  Date Time Provider [place]  08/23/2019 11:15 AM [facility] US2 [facility] [place], [place] for [employer] [facility], [place]

## 2019-06-30 ENCOUNTER — Encounter: Payer: Self-pay | Admitting: General Practice

## 2019-07-04 ENCOUNTER — Telehealth: Payer: Self-pay | Admitting: *Deleted

## 2019-07-04 NOTE — Telephone Encounter (Signed)
Patient called requesting a sign letter stating her due date for school. Letter printed and signed by Nurse Latina Craver. Patient to pickup letter.  Derl Barrow, RN

## 2019-07-19 ENCOUNTER — Ambulatory Visit (INDEPENDENT_AMBULATORY_CARE_PROVIDER_SITE_OTHER): Payer: BC Managed Care – PPO | Admitting: *Deleted

## 2019-07-19 ENCOUNTER — Other Ambulatory Visit (HOSPITAL_COMMUNITY)
Admission: RE | Admit: 2019-07-19 | Discharge: 2019-07-19 | Disposition: A | Discharge status: Home / Self Care | Payer: BC Managed Care – PPO | Source: Ambulatory Visit | Attending: Obstetrics and Gynecology | Admitting: Obstetrics and Gynecology

## 2019-07-19 VITALS — BP 118/75 | HR 110 | Temp 97.9°F | Wt 239.0 lb

## 2019-07-19 DIAGNOSIS — N898 Other specified noninflammatory disorders of vagina: Secondary | ICD-10-CM | POA: Insufficient documentation

## 2019-07-19 LAB — POCT URINALYSIS DIPSTICK OB
Bilirubin, UA: NEGATIVE
Blood, UA: NEGATIVE
Glucose, UA: NEGATIVE
Ketones, UA: NEGATIVE
Leukocytes, UA: NEGATIVE
Nitrite, UA: NEGATIVE
POC,PROTEIN,UA: NEGATIVE
Spec Grav, UA: 1.025 (ref 1.010–1.025)
Urobilinogen, UA: 0.2 E.U./dL
pH, UA: 7 (ref 5.0–8.0)

## 2019-07-19 NOTE — Progress Notes (Signed)
   SUBJECTIVE:  19 y.o. female complains of vaginal itching for 7 day(s).  Denies abnormal vaginal bleeding or significant pelvic pain or fever. Itching with urination-UTI symptoms.  Denies history of known exposure to STD.  Patient's last menstrual period was 01/24/2019.  OBJECTIVE:  She appears well, afebrile. Urine dipstick: negative for all components.  ASSESSMENT:  Vaginal itching   PLAN:  GC, chlamydia, trichomonas, BVAG, CVAG probe sent to lab. Treatment: To be determined once lab results are received ROV prn if symptoms persist or worsen.  Derl Barrow, RN

## 2019-07-20 LAB — CERVICOVAGINAL ANCILLARY ONLY
Bacterial Vaginitis (gardnerella): POSITIVE — AB
Candida Glabrata: NEGATIVE
Candida Vaginitis: POSITIVE — AB
Chlamydia: NEGATIVE
Comment: NEGATIVE
Comment: NEGATIVE
Comment: NEGATIVE
Comment: NEGATIVE
Comment: NEGATIVE
Comment: NORMAL
Neisseria Gonorrhea: NEGATIVE
Trichomonas: NEGATIVE

## 2019-07-21 ENCOUNTER — Telehealth: Payer: Self-pay | Admitting: *Deleted

## 2019-07-21 DIAGNOSIS — B3731 Acute candidiasis of vulva and vagina: Secondary | ICD-10-CM

## 2019-07-21 DIAGNOSIS — N760 Acute vaginitis: Secondary | ICD-10-CM

## 2019-07-21 MED ORDER — METRONIDAZOLE 500 MG PO TABS: 500.0000 mg | ORAL_TABLET | Freq: Two times a day (BID) | ORAL | 0 refills | Status: DC

## 2019-07-21 MED ORDER — FLUCONAZOLE 150 MG PO TABS: ORAL_TABLET | ORAL | 0 refills | Status: DC

## 2019-07-21 NOTE — Telephone Encounter (Signed)
-----   Message from Laury Deep, [place] sent at 07/21/2019  8:28 AM EDT ----- Yes. That is ok in the 2nd trimester ----- Message ----- From: Derl Barrow, RN Sent: 07/20/2019   4:11 PM EDT To: Laury Deep, CNM  You want me to do Diflucan instead of cream? ----- Message ----- From: Laury Deep, CNM Sent: 07/20/2019   4:08 PM EDT To: Derl Barrow, RN  Please treat for BV and then yeast

## 2019-07-23 LAB — URINE CULTURE, OB REFLEX

## 2019-07-23 LAB — CULTURE, OB URINE

## 2019-07-27 ENCOUNTER — Encounter: Payer: Self-pay | Admitting: Medical

## 2019-07-27 ENCOUNTER — Encounter: Payer: Self-pay | Admitting: Certified Nurse Midwife

## 2019-07-27 ENCOUNTER — Other Ambulatory Visit: Payer: Self-pay

## 2019-07-27 ENCOUNTER — Ambulatory Visit (INDEPENDENT_AMBULATORY_CARE_PROVIDER_SITE_OTHER): Payer: BC Managed Care – PPO | Admitting: Medical

## 2019-07-27 ENCOUNTER — Telehealth: Payer: BC Managed Care – PPO | Admitting: Medical

## 2019-07-27 VITALS — BP 118/71 | HR 113 | Temp 97.4°F | Wt 242.0 lb

## 2019-07-27 DIAGNOSIS — Z3400 Encounter for supervision of normal first pregnancy, unspecified trimester: Secondary | ICD-10-CM

## 2019-07-27 DIAGNOSIS — R8271 Bacteriuria: Secondary | ICD-10-CM | POA: Insufficient documentation

## 2019-07-27 DIAGNOSIS — Z3A26 26 weeks gestation of pregnancy: Secondary | ICD-10-CM

## 2019-07-27 DIAGNOSIS — O99820 Streptococcus B carrier state complicating pregnancy: Secondary | ICD-10-CM

## 2019-07-27 DIAGNOSIS — O2602 Excessive weight gain in pregnancy, second trimester: Secondary | ICD-10-CM | POA: Insufficient documentation

## 2019-07-27 NOTE — Patient Instructions (Addendum)
Second Trimester of Pregnancy  The second trimester is from week 14 through week 27 (month 4 through 6). This is often the time in pregnancy that you feel your best. Often times, morning sickness has lessened or quit. You may have more energy, and you may get hungry more often. Your unborn baby is growing rapidly. At the end of the sixth month, he or she is about 9 inches long and weighs about 1 pounds. You will likely feel the baby move between 18 and 20 weeks of pregnancy. Follow these instructions at home: Medicines  Take over-the-counter and prescription medicines only as told by your doctor. Some medicines are safe and some medicines are not safe during pregnancy.  Take a prenatal vitamin that contains at least 600 micrograms (mcg) of folic acid.  If you have trouble pooping (constipation), take medicine that will make your stool soft (stool softener) if your doctor approves. Eating and drinking   Eat regular, healthy meals.  Avoid raw meat and uncooked cheese.  If you get low calcium from the food you eat, talk to your doctor about taking a daily calcium supplement.  Avoid foods that are high in fat and sugars, such as fried and sweet foods.  If you feel sick to your stomach (nauseous) or throw up (vomit): ? Eat 4 or 5 small meals a day instead of 3 large meals. ? Try eating a few soda crackers. ? Drink liquids between meals instead of during meals.  To prevent constipation: ? Eat foods that are high in fiber, like fresh fruits and vegetables, whole grains, and beans. ? Drink enough fluids to keep your pee (urine) clear or pale yellow. Activity  Exercise only as told by your doctor. Stop exercising if you start to have cramps.  Do not exercise if it is too hot, too humid, or if you are in a place of great height (high altitude).  Avoid heavy lifting.  Wear low-heeled shoes. Sit and stand up straight.  You can continue to have sex unless your doctor tells you not  to. Relieving pain and discomfort  Wear a good support bra if your breasts are tender.  Take warm water baths (sitz baths) to soothe pain or discomfort caused by hemorrhoids. Use hemorrhoid cream if your doctor approves.  Rest with your legs raised if you have leg cramps or low back pain.  If you develop puffy, bulging veins (varicose veins) in your legs: ? Wear support hose or compression stockings as told by your doctor. ? Raise (elevate) your feet for 15 minutes, 3-4 times a day. ? Limit salt in your food. Prenatal care  Write down your questions. Take them to your prenatal visits.  Keep all your prenatal visits as told by your doctor. This is important. Safety  Wear your seat belt when driving.  Make a list of emergency phone numbers, including numbers for family, friends, the hospital, and police and fire departments. General instructions  Ask your doctor about the right foods to eat or for help finding a counselor, if you need these services.  Ask your doctor about local prenatal classes. Begin classes before month 6 of your pregnancy.  Do not use hot tubs, steam rooms, or saunas.  Do not douche or use tampons or scented sanitary pads.  Do not cross your legs for long periods of time.  Visit your dentist if you have not done so. Use a soft toothbrush to brush your teeth. Floss gently.  Avoid all smoking, herbs,   and alcohol. Avoid drugs that are not approved by your doctor.  Do not use any products that contain nicotine or tobacco, such as cigarettes and e-cigarettes. If you need help quitting, ask your doctor.  Avoid cat litter boxes and soil used by cats. These carry germs that can cause birth defects in the baby and can cause a loss of your baby (miscarriage) or stillbirth. Contact a doctor if:  You have mild cramps or pressure in your lower belly.  You have pain when you pee (urinate).  You have bad smelling fluid coming from your vagina.  You continue to  feel sick to your stomach (nauseous), throw up (vomit), or have watery poop (diarrhea).  You have a nagging pain in your belly area.  You feel dizzy. Get help right away if:  You have a fever.  You are leaking fluid from your vagina.  You have spotting or bleeding from your vagina.  You have severe belly cramping or pain.  You lose or gain weight rapidly.  You have trouble catching your breath and have chest pain.  You notice sudden or extreme puffiness (swelling) of your face, hands, ankles, feet, or legs.  You have not felt the baby move in over an hour.  You have severe headaches that do not go away when you take medicine.  You have trouble seeing. Summary  The second trimester is from week 14 through week 27 (months 4 through 6). This is often the time in pregnancy that you feel your best.  To take care of yourself and your unborn baby, you will need to eat healthy meals, take medicines only if your doctor tells you to do so, and do activities that are safe for you and your baby.  Call your doctor if you get sick or if you notice anything unusual about your pregnancy. Also, call your doctor if you need help with the right food to eat, or if you want to know what activities are safe for you. This information is not intended to replace advice given to you by your health care provider. Make sure you discuss any questions you have with your health care provider. Document Revised: 04/23/2018 Document Reviewed: 02/05/2016 Elsevier Patient Education  2020 Elsevier Inc.  AREA PEDIATRIC/FAMILY PRACTICE PHYSICIANS  [place] ([address]) . [facility] o Chambliss, MD; Eniola, MD; Hale, MD; Hensel, MD; McDiarmid, MD; McIntyer, MD; Neal, MD; Walden, MD o [address] [place], [place] [address] o [phone] o Mon-Fri 8:30-12:30, 1:30-5:00 o Providers come to see babies at [facility] o Accepting Medicaid . [facility] o Limited providers who accept newborns: Koirala, MD; Morrow, MD; Wolters, MD o [address], [place], [place] [address] o [phone] o Mon-Fri 8:00-5:30 o Babies seen by providers at [facility] o Does NOT accept Medicaid o Please call early in hospitalization for appointment (limited availability)  . [facility] o Mulberry, MD o [address] [place], [place] [address] o [phone] o Mon, Tue, Thur, Fri 8:30-5:00, Wed 10:00-7:00 (closed 1-2pm) o Babies seen by [facility] providers o Accepting Medicaid . [facility] o Rubin, MD o [address], [place], [place] [address] o [phone] o Mon-Fri 8:30-5:00, Sat 8:30-12:00 o Provider comes to see babies at [facility] o Accepting Medicaid o Must have been referred from current patients or contacted office prior to delivery . [facility] (  [facility]) o Brown, MD; Chandler, MD; Ettefagh, MD; Grant, MD; Lester, MD; McCormick, MD; McQueen, MD; Prose, MD; Simha, MD; Stanley, MD; Stryffeler, NP; Tebben, NP o [address], [place], [place] [address] o [phone] o Mon, Tue, Thur, Fri 8:30-5:30, Wed 9:30-5:30, Sat 8:30-12:30 o Babies seen by [facility] providers o Accepting Medicaid o Only accepting infants of first-time parents or siblings of current patients o Hospital discharge coordinator will make follow-up appointment . Jack Amos o [address], [place], [place]  [address] o [phone]   Fax - [phone] . [facility] o [address], [place], [place]  [address] o Phone - [phone]   Fax - [phone] . Shilpa Gosrani o [address], [place], [place]  [address] o [phone]  [place] ([address]) . [facility] o Bates, MD; [place], MD; Cooper, Cox, MD; MD; Davis, MD; Dovico, MD;  Ettefaugh, MD; Little, MD; Lowe, MD; Keiffer, MD; Melvin, MD; Sumner, MD; Williams, MD o [address], [place], [place] [address] o [phone] o Mon-Fri 8:30-5:00 (extended evenings Mon-Thur as needed), Sat-Sun 10:00-1:00 o Providers come to see babies at [facility] o Accepting Medicaid for families of first-time babies and families with all children in the household age 3 and under. Must register with office prior to making appointment (M-F only). . [facility] o Henson, NP; Knapp, MD; Lalonde, MD; Tysinger, PA o [address] [place], [place] [address] o [phone] o Mon-Fri 8:00-5:00 o Babies seen by providers at [facility] o Does NOT accept Medicaid/Commercial Insurance Only . [facility] ([facility])  o Artis, MD; Barnes, MD; Bratton, MD; Coccaro, MD; Lockett [place], MD; Kramer, MD; Marshall, MD; Netherton, MD; Poleto, MD; Skinner, MD o [address] [place], [place] [address] o [phone] o Mon-Fri 8:30-5:30, Sat (Oct.-Mar.) 9:00-1:00 o Babies seen by providers at [facility] o Accepting Medicaid  [place] ([address]) . [facility] o Reid, MD; Warner, MD o [address], [place], [place] [address] o [phone] o Mon-Fri 8:30-5:00, Sat 8:30-12:00 o Providers come to see babies at [facility] o Does NOT accept Medicaid . [facility] o Becker, PA; Hagler, MD; Scifres, PA; Sun, MD; Swayne, MD o [address], [place], [place] [address] o [phone] o Mon-Fri 8:00-5:00 o Babies seen by providers at [facility] o Does NOT accept Medicaid o Only accepting babies of parents who are patients o Please call early in hospitalization for appointment (limited availability) . [facility] o Clark, MD; Frye, MD; Kelleher, MD; Mack, NP; Miller, MD; O'Keller, MD; Patterson, NP; Pudlo, MD; Puzio, MD; Thomas, MD;  Tucker, MD; Twiselton, MD o [address], [place], [place] [address] o [phone] o Mon-Fri 8:00-5:00, Sat 9:00-12:00 o Providers come to see babies at [facility] o Does NOT accept Medicaid  [place] ([address]) . [facility] o Limited providers accepting new patients: Brake, NP; Wharton, PA o [address], [place], [place] [address] o [phone] o Mon-Fri 8:00-5:00 o Babies seen by providers at [facility] o Does NOT accept Medicaid o Only accepting babies of parents who are patients o Please call early in hospitalization for appointment (limited availability) . [facility] o Gay, MD; Quinlan, MD o [address] , [place] [address] o [phone] (press 1 to schedule appointment) o Mon-Fri 8:00-5:00 o Providers come to   see babies at [facility] o Does NOT accept Medicaid . [facility] o Mazer, MD o [address] [place], [place] [address] o [phone] o Mon-Fri 8:30-5:00 (lunch 12:30-1:00), extended hours by appointment only Wed 5:00-6:30 o Babies seen by [facility] providers o Accepting Medicaid . [facility] o Banks, MD; Jordan, MD; Koberlein, MD o [address], [place], [place] [address] o [phone] o Mon-Fri 8:00-5:00 o Babies seen by [facility] providers o Does NOT accept Medicaid . [facility] o Parker, MD; Hunter, MD; [place], DO o [address] [place], [place] [address] o [phone] o Mon-Fri 8:00-5:00 o Babies seen by [facility] providers o Does NOT accept Medicaid . [facility] o Brandon, PA; Brecken, PA; Christy, NP; Dees, MD; DeClaire, MD; DeWeese, MD; Hansen, NP; Mills, NP; Parrish, NP; Smoot, NP; Summer, MD; Vapne, MD o [address] [place], [place] [address] o [phone] o Mon-Fri 8:30-5:00, Sat 10:00-1:00 o Providers come to see babies at [facility] o Does NOT accept Medicaid o Free prenatal information session Tuesdays at 4:45pm . [facility] o Bouska, MD; [place], PA; Jeffery, PA; Weber, PA o [address] [place] [address] o [phone] o Mon-Fri 7:30-5:30 o Babies seen by [facility] providers . [facility] o [address], [place], [place]  [address] o [phone]   Fax - [phone]  [place] ([address]) . [facility] o [place]ese, MD o [address] [place], [place] [address] o [phone] o Mon-Thur 8:00-6:00 o Providers come to see babies at [facility] o Accepting Medicaid . [facility] o Anderson, NP; Badger, MD; Beal, PA; Spencer, PA o [address] [place], [place] [address] o [phone] o Mon-Thur 7:30-7:30, Fri 7:30-4:30 o Babies seen by [facility] providers o Accepting Medicaid . [facility] o Agbuya, MD; Klett, NP; Romgoolam, MD o [address], [place], [place] [address] o [phone] o Mon-Fri 8:30-5:00, Sat 8:30-12:00 o Providers come to see babies at [facility] o Accepting Medicaid o Must have "Meet & Greet" appointment at office prior to delivery . [facility] ([facility] o McCord, MD; [place], MD; Wood, MD o [address], [place], [place] [address] o [phone] o Mon-Wed 8:00-6:00, Thur-Fri 8:00-5:00, Sat 9:00-12:00 o Providers come to see babies at [facility] o Does NOT accept Medicaid o Only accepting siblings of current patients . [facility]  o [address], [place], [place]  [address] o [phone]   Fax - [phone] . [facility] o [address], [place], [place]  [address] o [phone]   Fax - [phone]  [place] ([address]) . [facility] o Cirigliano, DO; Matthews, DO o [address] [place], [place] [address] o [phone] o Mon-Fri 7:00-5:00 o Babies seen by [facility] providers o Does NOT accept Medicaid . [facility] o Briscoe, MD; Howley, PA; Moreira, PA o [address], [place], [place] [address] o [phone] o Mon-Fri 8:00-5:00 o Babies seen by [facility] providers o Accepting Medicaid . [facility] o Boyd, MD; Church, PA; Jones, NP; Osborn, PA o [address], [place], [place] [address] o [phone] o Mon-Fri 8:00-5:00 o Babies seen by providers at [facility] o   Accepting Medicaid  [place] ([address]) . [facility] [place] o Wendling, DO o [address] [place], [place] [address] o [phone] o Mon-Fri 8:00-5:00 o Babies seen by [facility] providers o Does NOT accept Medicaid o Limited availability, please call early in hospitalization to schedule follow-up . [facility] o Calderon, PA; Cummings, MD; Dillard, MD; Martin, PA; Olson, MD; VanDeven, PA o [address], [place], [place] [address] o [phone] o Mon-Fri 8:30-5:00, Sat 9:00-12:00 o Babies seen by providers at [facility] o Accepting Medicaid o Please register online then schedule online or call office o [email or web address] . [facility] ([facility] o Hunter, NP; Kumar, MD; Martin Rogers, PA o [address], [place], [place] [address] o [phone] o Mon-Fri 8:00-5:00 o Babies seen by providers at [facility] o Accepting Medicaid . [facility] ([facility] o Nuiqsut, MD; Kristi Fleenor, NP; West, MD o [address], [place], [place] [address] o [phone] o Mon-Fri 8:00-5:30, Sat&Sun by  appointment (phones open at 8:30) o Babies seen by [facility] providers o Accepting Medicaid o Must be a first-time baby or sibling of current patient . [facility] [place]  o [address], [place], [place]  [address] o [phone]   Fax - [phone]  [place] ([address]) . [facility] o Brown, PA; Cowen, PA; Rice, MD; Helton, PA; Spry, MD o [address] [place], [place] [address] o [phone] o Mon-Thur 8:00-7:00, Fri 8:00-5:00, Sat 8:00-12:00, Sun 9:00-12:00 o Babies seen by [facility] providers o Accepting Medicaid . [facility] [place] o Coe-Goins, MD; Marshall, MD; Pierre-Louis, MD o [address] [place] [address], [place], [place] [address] o [phone] o Mon-Thur 8:00-5:00 o Babies seen by providers at [facility] o Accepting Medicaid . [facility] o Bratton, MD; Coe-Goins, MD; Hayes, MD; Lewis, MD; List, MD; Lott, MD; Marshall, MD; Moran, MD; O'Neal, MD; Pierre-Louis, MD; Pitonzo, MD; Scholer, MD; Spangle, MD o [address] [place], [place] [address] o [phone] o Mon-Fri 8:00-5:30, Sat (Oct.-Mar.) 9:00-1:00 o Babies seen by providers at [facility] o Accepting Medicaid o Must fill out new patient packet, available online at [email or web address] . [facility] ([facility]) o Friddle, NP; Harris, NP; Kelly, NP; Logan, MD; Melvin, PA; Poth, MD; Ramadoss, MD; Stanton, NP o [address], [place], [place] [address] o [phone] o Mon-Thur 8:00-5:30, Fri 8:00-5:00 o Babies seen by providers at [facility] o Accepting Medicaid  [place] ([address]) . [facility] o Dixon, PA; Galien, MD; Pickard, MD; Tapia, PA o [address], [place], [place] [address] o [phone] o Mon-Fri 8:00-5:00 o Babies seen  by providers at [facility] o Accepting Medicaid   [place] ([address]) . [facility] [place] o Masneri, DO; Meyers, MD; Nelson, PA o [address], [place], [place] [address] o [phone] o Mon-Fri 8:00-5:00 o Babies seen by providers at [facility] o Does NOT accept Medicaid o Limited appointment availability, please call early in hospitalization  . [facility] [place] o Kunedd, DO; McGowen, MD o [address], [place],  [address] o [phone] o   Mon-Fri 8:00-5:00 o Babies seen by [facility] providers o Does NOT accept Medicaid . [facility] [place] o Cameron, MD; MacDonald, MD; Michaels, PA; Nayak, MD o [address] [place] [address], [place], [place] [address] o [phone] o Mon-Fri 8:00-5:00 o After hours clinic ([address] [place], [place] [address]) [phone] Mon-Fri 5:00-8:00, Sat 12:00-6:00, Sun 10:00-4:00 o Babies seen by [facility] providers o Accepting Medicaid . [facility] [place] o [address], [place], [place]  [address] o [phone]   Fax - [phone]  [place] ([address]) . [facility] [place] [facility] o Andy, MD o [address], [place], [place] [address] o [phone] o Mon-Fri 8:00-5:00 o Babies seen by [facility] providers o Does NOT accept Medicaid . [facility] [place] ([facility] [place]) o Eksir, MD o [address], [place], [place] [address] o [phone] o Mon-Thur 8:00-7:00, Fri 8:00-5:00, Sat 8:00-12:00 o Babies seen by providers at [facility] o Accepting Medicaid - but does not have vaccinations in office (must be received elsewhere) o Limited availability, please call early in hospitalization  [place] ([address]) .  [facility]  o Charlene Flemming, MD o [address],  [place] [address] o [phone]  Fax [phone]    

## 2019-07-27 NOTE — Progress Notes (Signed)
° °  PRENATAL VISIT NOTE  Subjective:  Kim Crosby is a 19 y.o. G1P0 at [redacted]w[redacted]d being seen today for ongoing prenatal care.  She is currently monitored for the following issues for this low-risk pregnancy and has Supervision of normal first pregnancy, antepartum; GBS bacteriuria; and Excess weight gain in pregnancy, second trimester on their problem list.  Patient reports no complaints.  Contractions: Not present. Vag. Bleeding: None.  Movement: Present. Denies leaking of fluid.   The following portions of the patient's history were reviewed and updated as appropriate: allergies, current medications, past family history, past medical history, past social history, past surgical history and problem list.   Objective:   Vitals:   07/27/19 1317 07/27/19 1346  BP: 127/84 118/71  Pulse: (!) 124 (!) 113  Temp: (!) 97.4 F (36.3 C)   Weight: 242 lb (109.8 kg)     Fetal Status: Fetal Heart Rate (bpm): 152   Movement: Present     General:  Alert, oriented and cooperative. Patient is in no acute distress.  Skin: Skin is warm and dry. No rash noted.   Cardiovascular: Normal heart rate noted  Respiratory: Normal respiratory effort, no problems with respiration noted  Abdomen: Soft, gravid, appropriate for gestational age.  Pain/Pressure: Absent     Pelvic: Cervical exam deferred        Extremities: Normal range of motion.  Edema: None  Mental Status: Normal mood and affect. Normal behavior. Normal judgment and thought content.   Assessment and Plan:  Pregnancy: G1P0 at [redacted]w[redacted]d 1. Supervision of normal first pregnancy, antepartum - Doing well - Planning WB, has scheduled for class  - Will need consent at next CNM visit  - Peds list provided - MOC discussed, considering POPs or Nuvaring - Anticipatory guidance for next visit discussed, including need for fasting labs  2. GBS bacteriuria - Treat in labor   3. Excess weight gain in pregnancy, second trimester - Discussed ideal nutrition and  exercise for third trimester   Preterm labor symptoms and general obstetric precautions including but not limited to vaginal bleeding, contractions, leaking of fluid and fetal movement were reviewed in detail with the patient. Please refer to After Visit Summary for other counseling recommendations.   Return in about 2 weeks (around 08/10/2019) for LOB, 28 week labs (fasting), In-Person.  Future Appointments  Date Time Provider [place]  08/12/2019  8:50 AM Gavin Pound, CNM [facility] None  08/23/2019 11:15 AM [facility] US2 [facility]    Kerry Hough, PA-C

## 2019-08-12 ENCOUNTER — Ambulatory Visit (INDEPENDENT_AMBULATORY_CARE_PROVIDER_SITE_OTHER): Payer: BC Managed Care – PPO

## 2019-08-12 ENCOUNTER — Other Ambulatory Visit: Payer: Self-pay

## 2019-08-12 VITALS — BP 114/76 | HR 116 | Temp 98.2°F | Wt 246.6 lb

## 2019-08-12 DIAGNOSIS — O99013 Anemia complicating pregnancy, third trimester: Secondary | ICD-10-CM

## 2019-08-12 DIAGNOSIS — O24410 Gestational diabetes mellitus in pregnancy, diet controlled: Secondary | ICD-10-CM

## 2019-08-12 DIAGNOSIS — O2602 Excessive weight gain in pregnancy, second trimester: Secondary | ICD-10-CM

## 2019-08-12 DIAGNOSIS — Z3400 Encounter for supervision of normal first pregnancy, unspecified trimester: Secondary | ICD-10-CM

## 2019-08-12 NOTE — Progress Notes (Signed)
LOW-RISK PREGNANCY OFFICE VISIT  Patient name: Kim Crosby MRN [id number]  Date of birth: October 18, 2000 Chief Complaint:   Routine Prenatal Visit  Subjective:   Kim Crosby is a 19 y.o. G1P0 female at [redacted]w[redacted]d with an Estimated Date of Delivery: 10/27/19 being seen today for ongoing management of a low-risk pregnancy aeb has Supervision of normal first pregnancy, antepartum; GBS bacteriuria; and Excess weight gain in pregnancy, second trimester on their problem list.  Patient presents today with complaint of abdominal pain. Patient states that she experiences this pain after eating a "full meal." Patient reports she at cereal, yogurt, grilled cheese, fried apples, and [employer] and nachos with cheese yesterday.  Patient also reports pain in her leg with position change.  Patient states she is unable to describe the pain but notices it with changes in position particularly in the bed and states it only lasts seconds.  Patient denies vaginal concerns including abnormal discharge, leaking of fluid, and bleeding.  Contractions: Not present. Vag. Bleeding: None.  Movement: Present.  Reviewed past medical,surgical, social, obstetrical and family history as well as problem list, medications and allergies.  Objective   Vitals:   08/12/19 0835  BP: 114/76  Pulse: (!) 116  Temp: 98.2 F (36.8 C)  Weight: (!) 246 lb 9.6 oz (111.9 kg)  Body mass index is 43 kg/m.  Total Weight Gain:44 lb 9.6 oz (20.2 kg)         Physical Examination:   General appearance: Well appearing, and in no distress  Mental status: Alert, oriented to person, place, and time  Skin: Warm & dry  Cardiovascular: Normal heart rate noted  Respiratory: Normal respiratory effort, no distress  Abdomen: Soft, gravid, nontender, LGA with Fundal height of Fundal Height: 32 cm  Pelvic: Cervical exam deferred           Extremities: Edema: None  Fetal Status: Fetal Heart Rate (bpm): 147  Movement: Present   No results  found for this or any previous visit (from the past 24 hour(s)).  Assessment & Plan:  Low-risk pregnancy of a 19 y.o., G1P0 at [redacted]w[redacted]d with an Estimated Date of Delivery: 10/27/19   1. Supervision of normal first pregnancy, antepartum -Complaints reviewed.   -Discussed leg pain and informed likely sciatica.  Given information sheet regarding diagnosis and management. -Reviewed patient anticipated move to [facility] for continuation/completion of education at [employer] to schedule virtual visits when can and in person far in advance to allow for adequate planning around school schedule.   2. Excess weight gain in pregnancy, second trimester -Extensive discussion regarding weight gain in pregnancy. -Informed that abdominal/stomach pain sounds to be result of overeating. -Encouraged small frequent meals throughout the day. -Reviewed healthy eating patterns and bodies natural response to overeating (I.e. stomach pain) -Informed that if weight becomes too excessive may cause some providers to not feel comfortable performing WB. -Given information sheet on healthy eating   Meds: No orders of the defined types were placed in this encounter.  Labs/procedures today:  Lab Orders     Glucose Tolerance, 2 Hours w/1 Hour     HIV Antibody (routine testing w rflx)     RPR     CBC   Reviewed: Preterm labor symptoms and general obstetric precautions including but not limited to vaginal bleeding, contractions, leaking of fluid and fetal movement were reviewed in detail with the patient.  All questions were answered.  Follow-up: Return in about 4 weeks (around 09/09/2019)  for Virtual LR-ROB.  Orders Placed This Encounter  Procedures  . Glucose Tolerance, 2 Hours w/1 Hour  . HIV Antibody (routine testing w rflx)  . RPR  . [place] Taniela Feltus MSN, CNM 08/12/2019

## 2019-08-12 NOTE — Patient Instructions (Addendum)
Eating Plan for Pregnant Women While you are pregnant, your body requires additional nutrition to help support your growing baby. You also have a higher need for some vitamins and minerals, such as folic acid, calcium, iron, and vitamin D. Eating a healthy, well-balanced diet is very important for your health and your baby's health. Your need for extra calories varies for the three 80-monthsegments of your pregnancy (trimesters). For most women, it is recommended to consume:  150 extra calories a day during the first trimester.  300 extra calories a day during the second trimester.  300 extra calories a day during the third trimester. What are tips for following this plan?   Do not try to lose weight or go on a diet during pregnancy.  Limit your overall intake of foods that have "empty calories." These are foods that have little nutritional value, such as sweets, desserts, candies, and sugar-sweetened beverages.  Eat a variety of foods (especially fruits and vegetables) to get a full range of vitamins and minerals.  Take a prenatal vitamin to help meet your additional vitamin and mineral needs during pregnancy, specifically for folic acid, iron, calcium, and vitamin D.  Remember to stay active. Ask your health care provider what types of exercise and activities are safe for you.  Practice good food safety and cleanliness. Wash your hands before you eat and after you prepare raw meat. Wash all fruits and vegetables well before peeling or eating. Taking these actions can help to prevent food-borne illnesses that can be very dangerous to your baby, such as listeriosis. Ask your health care provider for more information about listeriosis. What does 150 extra calories look like? Healthy options that provide 150 extra calories each day could be any of the following:  6-8 oz (170-230 g) of plain low-fat yogurt with  cup of berries.  1 apple with 2 teaspoons (11 g) of peanut butter.  Cut-up  vegetables with  cup (60 g) of hummus.  8 oz (230 mL) or 1 cup of low-fat chocolate milk.  1 stick of string cheese with 1 medium orange.  1 peanut butter and jelly sandwich that is made with one slice of whole-wheat bread and 1 tsp (5 g) of peanut butter. For 300 extra calories, you could eat two of those healthy options each day. What is a healthy amount of weight to gain? The right amount of weight gain for you is based on your BMI before you became pregnant. If your BMI:  Was less than 18 (underweight), you should gain 28-40 lb (13-18 kg).  Was 18-24.9 (normal), you should gain 25-35 lb (11-16 kg).  Was 25-29.9 (overweight), you should gain 15-25 lb (7-11 kg).  Was 30 or greater (obese), you should gain 11-20 lb (5-9 kg). What if I am having twins or multiples? Generally, if you are carrying twins or multiples:  You may need to eat 300-600 extra calories a day.  The recommended range for total weight gain is 25-54 lb (11-25 kg), depending on your BMI before pregnancy.  Talk with your health care provider to find out about nutritional needs, weight gain, and exercise that is right for you. What foods can I eat?  Fruits All fruits. Eat a variety of colors and types of fruit. Remember to wash your fruits well before peeling or eating. Vegetables All vegetables. Eat a variety of colors and types of vegetables. Remember to wash your vegetables well before peeling or eating. Grains All grains. Choose whole grains, such  as whole-wheat bread, oatmeal, or brown rice. Meats and other protein foods Lean meats, including chicken, [place], fish, and lean cuts of beef, veal, or pork. If you eat fish or seafood, choose options that are higher in omega-3 fatty acids and lower in mercury, such as salmon, herring, mussels, trout, sardines, pollock, shrimp, crab, and lobster. Tofu. Tempeh. Beans. Eggs. Peanut butter and other nut butters. Make sure that all meats, poultry, and eggs are cooked to  food-safe temperatures or "well-done." Two or more servings of fish are recommended each week in order to get the most benefits from omega-3 fatty acids that are found in seafood. Choose fish that are lower in mercury. You can find more information online:  [employer]Gaming.ch Dairy Pasteurized milk and milk alternatives (such as almond milk). Pasteurized yogurt and pasteurized cheese. Cottage cheese. Sour cream. Beverages Water. Juices that contain 100% fruit juice or vegetable juice. Caffeine-free teas and decaffeinated coffee. Drinks that contain caffeine are okay to drink, but it is better to avoid caffeine. Keep your total caffeine intake to less than 200 mg each day (which is 12 oz or 355 mL of coffee, tea, or soda) or the limit as told by your health care provider. Fats and oils Fats and oils are okay to include in moderation. Sweets and desserts Sweets and desserts are okay to include in moderation. Seasoning and other foods All pasteurized condiments. The items listed above may not be a complete list of foods and beverages you can eat. Contact a dietitian for more information. What foods are not recommended? Fruits Unpasteurized fruit juices. Vegetables Raw (unpasteurized) vegetable juices. Meats and other protein foods Lunch meats, bologna, hot dogs, or other deli meats. (If you must eat those meats, reheat them until they are steaming hot.) Refrigerated pat, meat spreads from a meat counter, smoked seafood that is found in the refrigerated section of a store. Raw or undercooked meats, poultry, and eggs. Raw fish, such as sushi or sashimi. Fish that have high mercury content, such as tilefish, shark, swordfish, and king mackerel. To learn more about mercury in fish, talk with your health care provider or look for online resources, such as:  [employer]Gaming.ch Dairy Raw (unpasteurized) milk and any foods that have raw milk in them. Soft cheeses, such as feta, queso blanco, queso fresco, Brie,  Camembert cheeses, blue-veined cheeses, and Panela cheese (unless it is made with pasteurized milk, which must be stated on the label). Beverages Alcohol. Sugar-sweetened beverages, such as sodas, teas, or energy drinks. Seasoning and other foods Homemade fermented foods and drinks, such as pickles, sauerkraut, or kombucha drinks. (Store-bought pasteurized versions of these are okay.) Salads that are made in a store or deli, such as ham salad, chicken salad, egg salad, tuna salad, and seafood salad. The items listed above may not be a complete list of foods and beverages you should avoid. Contact a dietitian for more information. Where to find more information To calculate the number of calories you need based on your height, weight, and activity level, you can use an online calculator such as:  MobileTransition.ch To calculate how much weight you should gain during pregnancy, you can use an online pregnancy weight gain calculator such as:  StreamingFood.com.cy Summary  While you are pregnant, your body requires additional nutrition to help support your growing baby.  Eat a variety of foods, especially fruits and vegetables to get a full range of vitamins and minerals.  Practice good food safety and cleanliness. Wash your hands before you eat  and after you prepare raw meat. Wash all fruits and vegetables well before peeling or eating. Taking these actions can help to prevent food-borne illnesses, such as listeriosis, that can be very dangerous to your baby.  Do not eat raw meat or fish. Do not eat fish that have high mercury content, such as tilefish, shark, swordfish, and king mackerel. Do not eat unpasteurized (raw) dairy.  Take a prenatal vitamin to help meet your additional vitamin and mineral needs during pregnancy, specifically for folic acid, iron, calcium, and vitamin D. This information is not intended to replace advice given to you by  your health care provider. Make sure you discuss any questions you have with your health care provider. Document Revised: 05/20/2018 Document Reviewed: 09/26/2016 Elsevier Patient Education  [place].  Sciatica  Sciatica is pain, numbness, weakness, or tingling along the path of the sciatic nerve. The sciatic nerve starts in the lower back and runs down the back of each leg. The nerve controls the muscles in the lower leg and in the back of the knee. It also provides feeling (sensation) to the back of the thigh, the lower leg, and the sole of the foot. Sciatica is a symptom of another medical condition that pinches or puts pressure on the sciatic nerve. Sciatica most often only affects one side of the body. Sciatica usually goes away on its own or with treatment. In some cases, sciatica may come back (recur). What are the causes? This condition is caused by pressure on the sciatic nerve or pinching of the nerve. This may be the result of:  A disk in between the bones of the spine bulging out too far (herniated disk).  Age-related changes in the spinal disks.  A pain disorder that affects a muscle in the buttock.  Extra bone growth near the sciatic nerve.  A break (fracture) of the pelvis.  Pregnancy.  Tumor. This is rare. What increases the risk? The following factors may make you more likely to develop this condition:  Playing sports that place pressure or stress on the spine.  Having poor strength and flexibility.  A history of back injury or surgery.  Sitting for long periods of time.  Doing activities that involve repetitive bending or lifting.  Obesity. What are the signs or symptoms? Symptoms can vary from mild to very severe, and they may include:  Any of these problems in the lower back, leg, hip, or buttock: ? Mild tingling, numbness, or dull aches. ? Burning sensations. ? Sharp pains.  Numbness in the back of the calf or the sole of the foot.  Leg  weakness.  Severe back pain that makes movement difficult. Symptoms may get worse when you cough, sneeze, or laugh, or when you sit or stand for long periods of time. How is this diagnosed? This condition may be diagnosed based on:  Your symptoms and medical history.  A physical exam.  Blood tests.  Imaging tests, such as: ? X-rays. ? MRI. ? CT scan. How is this treated? In many cases, this condition improves on its own without treatment. However, treatment may include:  Reducing or modifying physical activity.  Exercising and stretching.  Icing and applying heat to the affected area.  Medicines that help to: ? Relieve pain and swelling. ? Relax your muscles.  Injections of medicines that help to relieve pain, irritation, and inflammation around the sciatic nerve (steroids).  Surgery. Follow these instructions at home: Medicines  Take over-the-counter and prescription medicines only  as told by your health care provider.  Ask your health care provider if the medicine prescribed to you: ? Requires you to avoid driving or using heavy machinery. ? Can cause constipation. You may need to take these actions to prevent or treat constipation:  Drink enough fluid to keep your urine pale yellow.  Take over-the-counter or prescription medicines.  Eat foods that are high in fiber, such as beans, whole grains, and fresh fruits and vegetables.  Limit foods that are high in fat and processed sugars, such as fried or sweet foods. Managing pain      If directed, put ice on the affected area. ? Put ice in a plastic bag. ? Place a towel between your skin and the bag. ? Leave the ice on for 20 minutes, 2-3 times a day.  If directed, apply heat to the affected area. Use the heat source that your health care provider recommends, such as a moist heat pack or a heating pad. ? Place a towel between your skin and the heat source. ? Leave the heat on for 20-30 minutes. ? Remove the  heat if your skin turns bright red. This is especially important if you are unable to feel pain, heat, or cold. You may have a greater risk of getting burned. Activity   Return to your normal activities as told by your health care provider. Ask your health care provider what activities are safe for you.  Avoid activities that make your symptoms worse.  Take brief periods of rest throughout the day. ? When you rest for longer periods, mix in some mild activity or stretching between periods of rest. This will help to prevent stiffness and pain. ? Avoid sitting for long periods of time without moving. Get up and move around at least one time each hour.  Exercise and stretch regularly, as told by your health care provider.  Do not lift anything that is heavier than 10 lb (4.5 kg) while you have symptoms of sciatica. When you do not have symptoms, you should still avoid heavy lifting, especially repetitive heavy lifting.  When you lift objects, always use proper lifting technique, which includes: ? Bending your knees. ? Keeping the load close to your body. ? Avoiding twisting. General instructions  Maintain a healthy weight. Excess weight puts extra stress on your back.  Wear supportive, comfortable shoes. Avoid wearing high heels.  Avoid sleeping on a mattress that is too soft or too hard. A mattress that is firm enough to support your back when you sleep may help to reduce your pain.  Keep all follow-up visits as told by your health care provider. This is important. Contact a health care provider if:  You have pain that: ? Wakes you up when you are sleeping. ? Gets worse when you lie down. ? Is worse than you have experienced in the past. ? Lasts longer than 4 weeks.  You have an unexplained weight loss. Get help right away if:  You are not able to control when you urinate or have bowel movements (incontinence).  You have: ? Weakness in your lower back, pelvis, buttocks, or  legs that gets worse. ? Redness or swelling of your back. ? A burning sensation when you urinate. Summary  Sciatica is pain, numbness, weakness, or tingling along the path of the sciatic nerve.  This condition is caused by pressure on the sciatic nerve or pinching of the nerve.  Sciatica can cause pain, numbness, or tingling in the lower  back, legs, hips, and buttocks.  Treatment often includes rest, exercise, medicines, and applying ice or heat. This information is not intended to replace advice given to you by your health care provider. Make sure you discuss any questions you have with your health care provider. Document Revised: 01/18/2018 Document Reviewed: 01/18/2018 Elsevier Patient Education  [place].

## 2019-08-13 DIAGNOSIS — O24410 Gestational diabetes mellitus in pregnancy, diet controlled: Secondary | ICD-10-CM | POA: Insufficient documentation

## 2019-08-13 DIAGNOSIS — O99013 Anemia complicating pregnancy, third trimester: Secondary | ICD-10-CM | POA: Insufficient documentation

## 2019-08-13 DIAGNOSIS — O24419 Gestational diabetes mellitus in pregnancy, unspecified control: Secondary | ICD-10-CM | POA: Insufficient documentation

## 2019-08-13 DIAGNOSIS — O24415 Gestational diabetes mellitus in pregnancy, controlled by oral hypoglycemic drugs: Secondary | ICD-10-CM | POA: Insufficient documentation

## 2019-08-13 LAB — CBC
Hematocrit: 32.3 % — ABNORMAL LOW (ref 34.0–46.6)
Hemoglobin: 10.5 g/dL — ABNORMAL LOW (ref 11.1–15.9)
MCH: 27.5 pg (ref 26.6–33.0)
MCHC: 32.5 g/dL (ref 31.5–35.7)
MCV: 85 fL (ref 79–97)
Platelets: 326 10*3/uL (ref 150–450)
RBC: 3.82 x10E6/uL (ref 3.77–5.28)
RDW: 14.2 % (ref 11.7–15.4)
WBC: 10.7 10*3/uL (ref 3.4–10.8)

## 2019-08-13 LAB — HIV ANTIBODY (ROUTINE TESTING W REFLEX): HIV Screen 4th Generation wRfx: NONREACTIVE

## 2019-08-13 LAB — GLUCOSE TOLERANCE, 2 HOURS W/ 1HR
Glucose, 1 hour: 169 mg/dL (ref 65–179)
Glucose, 2 hour: 153 mg/dL — ABNORMAL HIGH (ref 65–152)
Glucose, Fasting: 94 mg/dL — ABNORMAL HIGH (ref 65–91)

## 2019-08-13 LAB — RPR: RPR Ser Ql: NONREACTIVE

## 2019-08-13 MED ORDER — ACCU-CHEK SMARTVIEW VI STRP: ORAL_STRIP | 4 refills | Status: DC

## 2019-08-13 MED ORDER — ACCU-CHEK NANO SMARTVIEW W/DEVICE KIT: 1.0000 | PACK | 0 refills | Status: DC

## 2019-08-13 MED ORDER — FERROUS SULFATE 325 (65 FE) MG PO TBEC: 325.0000 mg | DELAYED_RELEASE_TABLET | Freq: Every day | ORAL | 3 refills | Status: DC

## 2019-08-13 MED ORDER — ACCU-CHEK SOFTCLIX LANCETS MISC
1.0000 | Freq: Four times a day (QID) | 4 refills | Status: DC
Start: 2019-08-13 — End: 2019-09-09

## 2019-08-13 NOTE — Addendum Note (Signed)
Addended by: Gavin Pound L on: 08/13/2019 11:43 PM   Modules accepted: Orders

## 2019-08-16 ENCOUNTER — Telehealth: Payer: Self-pay | Admitting: General Practice

## 2019-08-16 NOTE — Telephone Encounter (Signed)
Called patient to reschedule appt with Diabetes Education that was scheduled on 08/30/2019 due to patient will not be able to keep that appt.  Left message on VM for patient to give our office a call to reschedule.

## 2019-08-23 ENCOUNTER — Ambulatory Visit: Payer: BC Managed Care – PPO | Attending: Obstetrics and Gynecology

## 2019-08-23 ENCOUNTER — Other Ambulatory Visit: Payer: Self-pay | Admitting: *Deleted

## 2019-08-23 ENCOUNTER — Other Ambulatory Visit: Payer: Self-pay

## 2019-08-23 DIAGNOSIS — O24410 Gestational diabetes mellitus in pregnancy, diet controlled: Secondary | ICD-10-CM

## 2019-08-23 DIAGNOSIS — Z362 Encounter for other antenatal screening follow-up: Secondary | ICD-10-CM | POA: Diagnosis not present

## 2019-08-23 DIAGNOSIS — O368330 Maternal care for abnormalities of the fetal heart rate or rhythm, third trimester, not applicable or unspecified: Secondary | ICD-10-CM

## 2019-08-23 DIAGNOSIS — O359XX0 Maternal care for (suspected) fetal abnormality and damage, unspecified, not applicable or unspecified: Secondary | ICD-10-CM | POA: Diagnosis not present

## 2019-08-23 DIAGNOSIS — Z3A30 30 weeks gestation of pregnancy: Secondary | ICD-10-CM

## 2019-08-23 DIAGNOSIS — O24419 Gestational diabetes mellitus in pregnancy, unspecified control: Secondary | ICD-10-CM

## 2019-08-23 DIAGNOSIS — O99213 Obesity complicating pregnancy, third trimester: Secondary | ICD-10-CM

## 2019-08-23 DIAGNOSIS — E669 Obesity, unspecified: Secondary | ICD-10-CM

## 2019-08-30 ENCOUNTER — Other Ambulatory Visit: Payer: BC Managed Care – PPO

## 2019-09-09 ENCOUNTER — Ambulatory Visit: Payer: BC Managed Care – PPO | Admitting: *Deleted

## 2019-09-09 ENCOUNTER — Telehealth (INDEPENDENT_AMBULATORY_CARE_PROVIDER_SITE_OTHER): Payer: BC Managed Care – PPO

## 2019-09-09 ENCOUNTER — Ambulatory Visit: Payer: BC Managed Care – PPO | Attending: Obstetrics and Gynecology

## 2019-09-09 ENCOUNTER — Other Ambulatory Visit: Payer: Self-pay

## 2019-09-09 ENCOUNTER — Encounter: Payer: Self-pay | Admitting: *Deleted

## 2019-09-09 VITALS — Wt 250.2 lb

## 2019-09-09 DIAGNOSIS — B951 Streptococcus, group B, as the cause of diseases classified elsewhere: Secondary | ICD-10-CM

## 2019-09-09 DIAGNOSIS — O403XX0 Polyhydramnios, third trimester, not applicable or unspecified: Secondary | ICD-10-CM

## 2019-09-09 DIAGNOSIS — E669 Obesity, unspecified: Secondary | ICD-10-CM

## 2019-09-09 DIAGNOSIS — Z3400 Encounter for supervision of normal first pregnancy, unspecified trimester: Secondary | ICD-10-CM

## 2019-09-09 DIAGNOSIS — O359XX0 Maternal care for (suspected) fetal abnormality and damage, unspecified, not applicable or unspecified: Secondary | ICD-10-CM

## 2019-09-09 DIAGNOSIS — O24410 Gestational diabetes mellitus in pregnancy, diet controlled: Secondary | ICD-10-CM | POA: Insufficient documentation

## 2019-09-09 DIAGNOSIS — O368330 Maternal care for abnormalities of the fetal heart rate or rhythm, third trimester, not applicable or unspecified: Secondary | ICD-10-CM | POA: Diagnosis not present

## 2019-09-09 DIAGNOSIS — O99213 Obesity complicating pregnancy, third trimester: Secondary | ICD-10-CM | POA: Diagnosis not present

## 2019-09-09 DIAGNOSIS — O99820 Streptococcus B carrier state complicating pregnancy: Secondary | ICD-10-CM

## 2019-09-09 DIAGNOSIS — O99013 Anemia complicating pregnancy, third trimester: Secondary | ICD-10-CM

## 2019-09-09 DIAGNOSIS — R8271 Bacteriuria: Secondary | ICD-10-CM | POA: Diagnosis not present

## 2019-09-09 DIAGNOSIS — Z3A33 33 weeks gestation of pregnancy: Secondary | ICD-10-CM

## 2019-09-09 DIAGNOSIS — D649 Anemia, unspecified: Secondary | ICD-10-CM

## 2019-09-09 DIAGNOSIS — O26813 Pregnancy related exhaustion and fatigue, third trimester: Secondary | ICD-10-CM

## 2019-09-09 DIAGNOSIS — O2603 Excessive weight gain in pregnancy, third trimester: Secondary | ICD-10-CM

## 2019-09-09 MED ORDER — ACCU-CHEK SOFTCLIX LANCETS MISC: 1.0000 | Freq: Four times a day (QID) | 4 refills | Status: DC

## 2019-09-09 MED ORDER — ACCU-CHEK NANO SMARTVIEW W/DEVICE KIT
1.0000 | PACK | 0 refills | Status: DC
Start: 2019-09-09 — End: 2022-04-06

## 2019-09-09 MED ORDER — ACCU-CHEK SMARTVIEW VI STRP: ORAL_STRIP | 4 refills | Status: DC

## 2019-09-09 NOTE — Progress Notes (Addendum)
OBSTETRICS PRENATAL VIRTUAL VISIT ENCOUNTER NOTE  Provider location: [facility] connected with Logan on 09/09/19 at  9:10 AM EDT by MyChart Video Encounter at home and verified that I am speaking with the correct person using two identifiers.   I discussed the limitations, risks, security and privacy concerns of performing an evaluation and management service virtually and the availability of in person appointments. I also discussed with the patient that there may be a patient responsible charge related to this service. The patient expressed understanding and agreed to proceed. Subjective:  Kim Crosby is a 19 y.o. G1P0 at 47w1dbeing seen today for ongoing prenatal care.  She is currently monitored for the following issues for this high-risk pregnancy.  and has Supervision of normal first pregnancy, antepartum; GBS bacteriuria; Excess weight gain in pregnancy, second trimester; Diet controlled gestational diabetes mellitus (GDM) in third trimester; and Anemia of mother in pregnancy, antepartum, third trimester on their problem list.  Patient reports fatigue. Patient states she has been feeling fatigued due to insomnia during the night.  Patient reports that she has school during the day. She states she has been unable to get her glucometer due to costs.  Patient states that she has not felt fetal movement today, but also has not eaten.   Contractions: Irregular. Vag. Bleeding: None.  Movement: Present. Denies any leaking of fluid.   The following portions of the patient's history were reviewed and updated as appropriate: allergies, current medications, past family history, past medical history, past social history, past surgical history and problem list.   Objective:  There were no vitals filed for this visit.  Fetal Status:     Movement: Present     General:  Alert, oriented and cooperative. Patient is in no acute distress.  Respiratory:  Normal respiratory effort, no problems with respiration noted  Mental Status: Normal mood and affect. Normal behavior. Normal judgment and thought content.  Rest of physical exam deferred due to type of encounter  Imaging: [place]FM OB FOLLOW UP  Result Date: 08/23/2019 ----------------------------------------------------------------------  OBSTETRICS REPORT                       (Signed Final 08/23/2019 01:23 pm) ---------------------------------------------------------------------- Patient Info  ID #:       [id number]                         D.O.B.:  0December 08, 2002(19 yrs)  Name:       Kim Crosby                  Visit Date: 08/23/2019 12:11 pm ---------------------------------------------------------------------- Performed By  Attending:        RTama HighMD        [facility]  Performed By:     DClaudia Desanctis      [facility] ---------------------------------------------------------------------- Orders  #  Description                           Code        Ordered By  1  [place] MFM OB FOLLOW UP                   [phone]    Tama High ----------------------------------------------------------------------  #  Order #                     Accession #                Episode #  1  [id number]                   [id number]                 [id number] ---------------------------------------------------------------------- Indications  Obesity complicating pregnancy, second         O99.212  trimester  Gestational diabetes in pregnancy,             O24.419  unspecified control  Abnormality fetal heart rate or rhythm, 3rd    O36.833  trimester  Antenatal follow-up for nonvisualized fetal    Z36.2  anatomy  Low risk NIPS, neg Horizon, neg AFP  Fetal abnormality -  other known or             O35.9XX0  suspected (absent NB)  [redacted] weeks gestation of pregnancy                Z3A.30 ---------------------------------------------------------------------- Fetal Evaluation  Num Of Fetuses:         1  Cardiac Activity:       Arrhythmia noted  Presentation:           Cephalic  Placenta:               Anterior  P. Cord Insertion:      Visualized, central  Amniotic Fluid  AFI FV:      Polyhydramnios  AFI Sum(cm)     %Tile       Largest Pocket(cm)  26.74           > 97        10.19  RUQ(cm)       RLQ(cm)       LUQ(cm)        LLQ(cm)  5.2           3.98          10.19          7.37 ---------------------------------------------------------------------- Biometry  BPD:      81.3  mm     G. Age:  32w 5d         90  %    CI:        79.05   %    70 - 86  FL/HC:      21.7   %    19.3 - 21.3  HC:      289.1  mm     G. Age:  31w 6d         44  %    HC/AC:      1.03        0.96 - 1.17  AC:      279.5  mm     G. Age:  32w 0d         82  %    FL/BPD:     77.0   %    71 - 87  FL:       62.6  mm     G. Age:  32w 3d         81  %    FL/AC:      22.4   %    20 - 24  Est. FW:    1915  gm      4 lb 4 oz     85  % ---------------------------------------------------------------------- OB History  Gravidity:    1         Term:   0        Prem:   0        SAB:   0  TOP:          0       Ectopic:  0        Living: 0 ---------------------------------------------------------------------- Gestational Age  LMP:           30w 1d        Date:  01/24/19                 EDD:   10/31/19  U/S Today:     32w 2d                                        EDD:   10/16/19  Best:          30w 5d     Det. By:  Loman Chroman         EDD:   10/27/19                                      (03/02/19) ---------------------------------------------------------------------- Anatomy  Cranium:               Appears normal         Face:                   Absent nasal bone                                                                         - non vis today  Cavum:                 Appears normal         Heart:  Appears normal                                                                        (4CH, axis, and                                                                        situs)  Ventricles:            Appears normal         Stomach:                Appears normal, left                                                                        sided  Choroid Plexus:        Appears normal         Cord Vessels:           Appears normal (3                                                                        vessel cord)  Cerebellum:            Appears normal         Kidneys:                Appear normal  Posterior Fossa:       Appears normal         Bladder:                Appears normal  Other:  Other anatomy previously imaged ---------------------------------------------------------------------- Cervix Uterus Adnexa  Cervix  Not visualized (advanced GA >24wks) ---------------------------------------------------------------------- Impression  Patient returned for fetal growth assessment.  Recently,  gestational diabetes was diagnosed.  She has not met with  the diabetic educator and has not started checking her blood  glucose.  Fetal growth is appropriate for gestational age .  Mild  polyhydramnios is seen.Antenatal testing is reassuring. BPP  8/8.  Fetal heart arrhythmia is consistent with premature atrial  contractions (PAC) or seen.  No evidence of pericardial  effusion.  I have reassured her that it is a benign finding and  is associated with excessive caffeine intake.  Patient denies  excess caffeine intake.  I emphasized the importance of good blood glucose control  to prevent adverse fetal or neonatal outcomes.  I encouraged  her to make an appointment with certified diabetic  educator. ---------------------------------------------------------------------- Recommendations   Weekly antenatal testing from [redacted] weeks gestation till delivery. ----------------------------------------------------------------------                  Tama High, MD Electronically Signed Final Report   08/23/2019 01:23 pm ----------------------------------------------------------------------   Assessment and Plan:  Pregnancy: G1P0 at 24w1d1. Supervision of normal first pregnancy, antepartum -Reviewed weight gain of 48 lbs during pregnancy. -Discussed upcoming appts including [place]   -Informed that provider unaware if MFM [place] be completed at another facility.  -Instructed to report to MAU if no fetal movement felt 1 hour after eating.   2. Diet controlled gestational diabetes mellitus (GDM) in third trimester -Extensive discussion regarding diabetes management and compliance. -Informed patient that lack of monitoring as well as uncontrolled diabetes would be a contraindication for WB. -Given information sheet regarding timing and anticipated blood sugar levels when management starts. -Further informed of need to take diabetic educational course.  -Instructed to start monitoring blood sugars now based on informational sheet provided.   3. Pregnancy related fatigue in third trimester -Discussed common occurrence of fatigue in 3rd trimester.  -Due to daytime obligations provider suggest benadryl dosing. *Instructed to start with 232mand increase to 5095ms needed. -Encouraged to rest when possible.    Preterm labor symptoms and general obstetric precautions including but not limited to vaginal bleeding, contractions, leaking of fluid and fetal movement were reviewed in detail with the patient. I discussed the assessment and treatment plan with the patient. The patient was provided an opportunity to ask questions and all were answered. The patient agreed with the plan and demonstrated an understanding of the instructions. The patient was advised to call back or seek an in-person office  evaluation/[facility] any urgent or concerning symptoms. Please refer to After Visit Summary for other counseling recommendations.   I provided 15 minutes of face-to-face time during this encounter.  Return in about 3 weeks (around 09/30/2019) for HROB with GBS.  Future Appointments  Date Time Provider DepW[place]emu[place]/27/2021  3:45 PM [facility]27/2021  4:00 PM [facility] US1 [facility]03/[facility]  3:30 PM [facility] NURSE [facility]/03/2019  3:45 PM [facility] US[facility]/10/2019  3:30 PM [facility] NURSE [facility]/10/2019  3:45 PM [facility]MFC US5 [facility]/17/2021 11:30 AM DawLaury DeepNM C[facility]-REN None    JesMaryann ConnersNMBrow[facility]

## 2019-09-09 NOTE — Patient Instructions (Addendum)
Group B Streptococcus Test During Pregnancy Why am I having this test? Routine testing, also called screening, for group B streptococcus (GBS) is recommended for all pregnant women between the 36th and 37th week of pregnancy. GBS is a type of bacteria that can be passed from mother to baby during childbirth. Screening will help guide whether or not you will need treatment during labor and delivery to prevent complications such as:  An infection in your uterus during labor.  An infection in your uterus after delivery.  A serious infection in your baby after delivery, such as pneumonia, meningitis, or sepsis. GBS screening is not often done before 36 weeks of pregnancy unless you go into labor prematurely. What happens if I have group B streptococcus? If testing shows that you have GBS, your health care provider will recommend treatment with IV antibiotics during labor and delivery. This treatment significantly decreases the risk of complications for you and your baby. If you have a planned C-section and you have GBS, you may not need to be treated with antibiotics because GBS is usually passed to babies after labor starts and your water breaks. If you are in labor or your water breaks before your C-section, it is possible for GBS to get into your uterus and be passed to your baby, so you might need treatment. Is there a chance I may not need to be tested? You may not need to be tested for GBS if:  You have a urine test that shows GBS before 36 to 37 weeks.  You had a baby with GBS infection after a previous delivery. In these cases, you will automatically be treated for GBS during labor and delivery. What is being tested? This test is done to check if you have group B streptococcus in your vagina or rectum. What kind of sample is taken? To collect samples for this test, your health care provider will swab your vagina and rectum with a cotton swab. The sample is then sent to the lab to see if  GBS is present. What happens during the test?   You will remove your clothing from the waist down.  You will lie down on an exam table in the same position as you would for a pelvic exam.  Your health care provider will swab your vagina and rectum to collect samples for a culture test.  You will be able to go home after the test and do all your usual activities. How are the results reported? The test results are reported as positive or negative. What do the results mean?  A positive test means you are at risk for passing GBS to your baby during labor and delivery. Your health care provider will recommend that you are treated with an IV antibiotic during labor and delivery.  A negative test means you are at very low risk of passing GBS to your baby. There is still a low risk of passing GBS to your baby because sometimes test results may report that you do not have a condition when you do (false-negative result) or there is a chance that you may become infected with GBS after the test is done. You most likely will not need to be treated with an antibiotic during labor and delivery. Talk with your health care provider about what your results mean. Questions to ask your health care provider Ask your health care provider, or the department that is doing the test:  When will my results be ready?  How will I  get my results?  What are my treatment options? Summary  Routine testing (screening) for group B streptococcus (GBS) is recommended for all pregnant women between the 36th and 37th week of pregnancy.  GBS is a type of bacteria that can be passed from mother to baby during childbirth.  If testing shows that you have GBS, your health care provider will recommend that you are treated with IV antibiotics during labor and delivery. This treatment almost always prevents infection in newborns. This information is not intended to replace advice given to you by your health care provider. Make  sure you discuss any questions you have with your health care provider. Document Revised: 04/22/2018 Document Reviewed: 01/27/2018 Elsevier Patient Education  [place].  Gestational Diabetes Mellitus, Self Care Caring for yourself after you have been diagnosed with gestational diabetes (gestational diabetes mellitus) means keeping your blood sugar (glucose) under control. You can do that with a balance of:  Nutrition.  Exercise.  Lifestyle changes.  Medicines or insulin, if necessary.  Support from your team of health care providers and others. The following information explains what you need to know to manage your gestational diabetes at home. What are the risks? If gestational diabetes is treated, it is unlikely to cause problems. If it is not controlled with treatment, it may cause problems during labor and delivery, and some of those problems can be harmful to the unborn baby (fetus) and the mother. Uncontrolled gestational diabetes may also cause the newborn baby to have breathing problems and low blood glucose. Women who get gestational diabetes are more likely to develop it if they get pregnant again, and they are more likely to develop type 2 diabetes in the future. How to monitor blood glucose   Check your blood glucose every day during your pregnancy. Do this as often as told by your health care provider.  Contact your health care provider if your blood glucose is above your target for two tests in a row. Your health care provider will set individualized treatment goals for you. Generally, the goal of treatment is to maintain the following blood glucose levels during pregnancy:  Before meals (preprandial): at or below 95 mg/dL (5.3 mmol/L).  After meals (postprandial): ? One hour after a meal: at or below 140 mg/dL (7.8 mmol/L). ? Two hours after a meal: at or below 120 mg/dL (6.7 mmol/L).  A1c (hemoglobin A1c) level: 6-6.5%. How to manage hyperglycemia and  hypoglycemia Hyperglycemia symptoms Hyperglycemia, also called high blood glucose, occurs when blood glucose is too high. Make sure you know the early signs of hyperglycemia, such as:  Increased thirst.  Hunger.  Feeling very tired.  Needing to urinate more often than usual.  Blurry vision. Hypoglycemia symptoms Hypoglycemia, also called low blood glucose, occurs with a blood glucose level at or below 70 mg/dL (3.9 mmol/L). The risk for hypoglycemia increases during or after exercise, during sleep, during illness, and when skipping meals or not eating for a long time (fasting). Symptoms may include:  Hunger.  Anxiety.  Sweating and feeling clammy.  Confusion.  Dizziness or feeling light-headed.  Sleepiness.  Nausea.  Increased heart rate.  Headache.  Blurry vision.  Irritability.  Tingling or numbness around the mouth, lips, or tongue.  A change in coordination.  Restless sleep.  Fainting.  Seizure. It is important to know the symptoms of hypoglycemia and treat it right away. Always have a 15-gram rapid-acting carbohydrate snack with you to treat low blood glucose. Family members and  close friends should also know the symptoms and should understand how to treat hypoglycemia, in case you are not able to treat yourself. Treating hypoglycemia If you are alert and able to swallow safely, follow the 15:15 rule:  Take 15 grams of a rapid-acting carbohydrate. Talk with your health care provider about how much you should take.  Rapid-acting options include: ? Glucose pills (take 15 grams). ? 6-8 pieces of hard candy. ? 4-6 oz (120-150 mL) of fruit juice. ? 4-6 oz (120-150 mL) of regular (not diet) soda. ? 1 Tbsp (15 mL) honey or sugar.  Check your blood glucose 15 minutes after you take the carbohydrate.  If the repeat blood glucose level is still at or below 70 mg/dL (3.9 mmol/L), take 15 grams of a carbohydrate again.  If your blood glucose level does not  increase above 70 mg/dL (3.9 mmol/L) after 3 tries, seek emergency medical care.  After your blood glucose level returns to normal, eat a meal or a snack within 1 hour. Treating severe hypoglycemia Severe hypoglycemia is when your blood glucose level is at or below 54 mg/dL (3 mmol/L). Severe hypoglycemia is an emergency. Do not wait to see if the symptoms will go away. Get medical help right away. Call your local emergency services (911 in the [place] If you have severe hypoglycemia and you cannot eat or drink, you may need an injection of glucagon. A family member or close friend should learn how to check your blood glucose and how to give you a glucagon injection. Ask your health care provider if you need to have an emergency glucagon injection kit available. Severe hypoglycemia may need to be treated in a hospital. The treatment may include getting glucose through an IV. You may also need treatment for the cause of your hypoglycemia. Follow these instructions at home: Take diabetes medicines as told  If your health care provider prescribed insulin or diabetes medicines, take them every day.  Do not run out of insulin or other diabetes medicines that you take. Plan ahead so you always have these available.  If you use insulin, adjust your dosage based on how physically active you are and what foods you eat. Your health care provider will tell you how to adjust your dosage. Make healthy food choices  The things that you eat and drink affect your blood glucose (and your insulin dose, if this applies). Making good choices helps to control your diabetes and prevent other health problems. A healthy meal plan includes eating lean proteins, complex carbohydrates, fresh fruits and vegetables, low-fat dairy products, and healthy fats. Make an appointment to see a diet and nutrition specialist (registered dietitian) to help you create an eating plan that is right for you. Make sure that you:  Follow  instructions from your health care provider about eating or drinking restrictions.  Drink enough fluid to keep your urine pale yellow.  Eat healthy snacks between nutritious meals.  Keep a record of the carbohydrates that you eat. Do this by reading food labels and learning the standard serving sizes of foods.  Follow your sick day plan whenever you cannot eat or drink as usual. Make this plan in advance with your health care provider.  Stay active  Do 30 or more minutes of physical activity a day, or as much physical activity as your health care provider recommends during your pregnancy. ? Doing 10 minutes of exercise starting 30 minutes after each meal may help to control postprandial blood glucose levels.  If you start a new exercise or activity, work with your health care provider to adjust your insulin, medicines, or food intake as needed. Make healthy lifestyle choices  Do not drink alcohol.  Do not use any tobacco products, such as cigarettes, chewing tobacco, and e-cigarettes. If you need help quitting, ask your health care provider.  Learn to manage stress. If you need help with this, ask your health care provider. Care for your body  Keep your immunizations up to date.  Brush your teeth and gums two times a day, and floss one or more times a day. Visit your dentist one or more times every 6 months.  Maintain a healthy weight during your pregnancy. General instructions  Take over-the-counter and prescription medicines only as told by your health care provider.  Talk with your health care provider about your risk for high blood pressure during pregnancy (preeclampsia or eclampsia).  Share your diabetes management plan with people in your workplace, school, and household.  Check your urine for ketones during your pregnancy when you are ill and as told by your health care provider.  Carry a medical alert card or wear medical alert jewelry that says you have gestational  diabetes.  Keep all follow-up visits during your pregnancy (prenatal) and after delivery (postnatal) as told by your health care provider. This is important. Get the care that you need after delivery  Have your blood glucose level checked 4-12 weeks after delivery. This is done with an oral glucose tolerance test (OGTT).  Get screened for diabetes at least every 3 years, or as often as told by your health care provider. Questions to ask your health care provider  Do I need to meet with a diabetes educator?  Where can I find a support group for people with gestational diabetes? Where to find more information For more information about gestational diabetes, visit:  American Diabetes Association (ADA): www.diabetes.org  Centers for Disease Control and Prevention (CDC): http://www.wolf.info/ Summary  Check your blood glucose every day during your pregnancy. Do this as often as told by your health care provider.  If your health care provider prescribed insulin or diabetes medicines, take them every day as told.  Keep all follow-up visits during your pregnancy (prenatal) and after delivery (postnatal) as told by your health care provider. This is important.  Have your blood glucose level checked 4-12 weeks after delivery. This information is not intended to replace advice given to you by your health care provider. Make sure you discuss any questions you have with your health care provider. Document Revised: 06/22/2017 Document Reviewed: 02/02/2015 Elsevier Patient Education  2020 [employer].

## 2019-09-14 ENCOUNTER — Other Ambulatory Visit: Payer: BC Managed Care – PPO

## 2019-09-14 ENCOUNTER — Ambulatory Visit: Payer: BC Managed Care – PPO

## 2019-09-15 ENCOUNTER — Ambulatory Visit: Payer: BC Managed Care – PPO | Attending: Maternal & Fetal Medicine

## 2019-09-15 ENCOUNTER — Other Ambulatory Visit: Payer: Self-pay

## 2019-09-15 DIAGNOSIS — E669 Obesity, unspecified: Secondary | ICD-10-CM | POA: Diagnosis not present

## 2019-09-15 DIAGNOSIS — Z3400 Encounter for supervision of normal first pregnancy, unspecified trimester: Secondary | ICD-10-CM

## 2019-09-15 DIAGNOSIS — O99213 Obesity complicating pregnancy, third trimester: Secondary | ICD-10-CM | POA: Diagnosis not present

## 2019-09-15 DIAGNOSIS — Z3A33 33 weeks gestation of pregnancy: Secondary | ICD-10-CM | POA: Diagnosis not present

## 2019-09-15 DIAGNOSIS — O359XX0 Maternal care for (suspected) fetal abnormality and damage, unspecified, not applicable or unspecified: Secondary | ICD-10-CM | POA: Insufficient documentation

## 2019-09-15 DIAGNOSIS — O24410 Gestational diabetes mellitus in pregnancy, diet controlled: Secondary | ICD-10-CM | POA: Diagnosis not present

## 2019-09-15 DIAGNOSIS — Z3A34 34 weeks gestation of pregnancy: Secondary | ICD-10-CM

## 2019-09-15 DIAGNOSIS — R8271 Bacteriuria: Secondary | ICD-10-CM

## 2019-09-16 ENCOUNTER — Ambulatory Visit: Payer: BC Managed Care – PPO

## 2019-09-21 ENCOUNTER — Telehealth: Payer: Self-pay

## 2019-09-21 ENCOUNTER — Other Ambulatory Visit: Payer: Self-pay

## 2019-09-21 NOTE — Telephone Encounter (Signed)
Pt. Called stating she wanted insulin so she could have a water birth. Instructed patient we do not prescribe medication without a provider. Asked patient what her blood sugar levels looked like and she stated she had not been taking them or keeping a log when she does take them. Instructed patient to begin taking blood sugars and witting them down starting today so a provider can look over them at her appt. On 09/30/2019. Patient voiced understanding.

## 2019-09-23 ENCOUNTER — Ambulatory Visit: Payer: BC Managed Care – PPO

## 2019-09-26 ENCOUNTER — Ambulatory Visit: Payer: BC Managed Care – PPO | Attending: Maternal & Fetal Medicine

## 2019-09-26 ENCOUNTER — Other Ambulatory Visit: Payer: Self-pay

## 2019-09-26 DIAGNOSIS — O99213 Obesity complicating pregnancy, third trimester: Secondary | ICD-10-CM | POA: Diagnosis not present

## 2019-09-26 DIAGNOSIS — O359XX0 Maternal care for (suspected) fetal abnormality and damage, unspecified, not applicable or unspecified: Secondary | ICD-10-CM | POA: Diagnosis not present

## 2019-09-26 DIAGNOSIS — Z3A35 35 weeks gestation of pregnancy: Secondary | ICD-10-CM

## 2019-09-26 DIAGNOSIS — O403XX0 Polyhydramnios, third trimester, not applicable or unspecified: Secondary | ICD-10-CM | POA: Diagnosis not present

## 2019-09-26 DIAGNOSIS — Z3400 Encounter for supervision of normal first pregnancy, unspecified trimester: Secondary | ICD-10-CM

## 2019-09-26 DIAGNOSIS — R8271 Bacteriuria: Secondary | ICD-10-CM

## 2019-09-26 DIAGNOSIS — O24410 Gestational diabetes mellitus in pregnancy, diet controlled: Secondary | ICD-10-CM | POA: Diagnosis not present

## 2019-09-26 DIAGNOSIS — E669 Obesity, unspecified: Secondary | ICD-10-CM | POA: Insufficient documentation

## 2019-09-26 DIAGNOSIS — O24419 Gestational diabetes mellitus in pregnancy, unspecified control: Secondary | ICD-10-CM | POA: Insufficient documentation

## 2019-09-26 LAB — GLUCOSE, CAPILLARY: Glucose-Capillary: 78 mg/dL (ref 70–99)

## 2019-09-26 NOTE — Progress Notes (Unsigned)
Pt unable to make her [facility] appt in [place] because she moved to [facility].  She had a f/u MFM appt today and appt for [facility] scheduled in [place] instead for 09/27/19 @ 1445.Pt is aware.

## 2019-09-27 ENCOUNTER — Encounter: Payer: BC Managed Care – PPO | Admitting: *Deleted

## 2019-09-27 ENCOUNTER — Encounter: Payer: Self-pay | Admitting: *Deleted

## 2019-09-27 VITALS — BP 110/70 | Ht 63.5 in | Wt 253.3 lb

## 2019-09-27 DIAGNOSIS — Z3A00 Weeks of gestation of pregnancy not specified: Secondary | ICD-10-CM | POA: Diagnosis not present

## 2019-09-27 DIAGNOSIS — O24410 Gestational diabetes mellitus in pregnancy, diet controlled: Secondary | ICD-10-CM

## 2019-09-27 NOTE — Progress Notes (Signed)
Diabetes Self-Management Education  Visit Type: First/Initial  Appt. Start Time: 1520 Appt. End Time: 7829  09/27/2019  Kim Crosby, identified by name and date of birth, is a 19 y.o. female with a diagnosis of Diabetes: Gestational Diabetes.   ASSESSMENT  Blood pressure 110/70, height 5' 3.5" (1.613 m), weight 253 lb 4.8 oz (114.9 kg), last menstrual period 01/24/2019, estimated date of delivery 10/27/2019 Body mass index is 44.17 kg/m.   Diabetes Self-Management Education - 09/27/19 1626      Visit Information   Visit Type First/Initial      Initial Visit   Diabetes Type Gestational Diabetes    Are you currently following a meal plan? Yes    What type of meal plan do you follow? "I cut out starches, sugary fruits, bread that isn't wheat and sugary drinks"    Are you taking your medications as prescribed? Yes    Date Diagnosed 1 month ago      Health Coping   How would you rate your overall health? Good      Psychosocial Assessment   Patient Belief/Attitude about Diabetes Motivated to manage diabetes   "terrible, stressed, sad"   Self-care barriers None    Self-management support Doctor's office;Friends    Other persons present Spouse/SO    Patient Concerns Nutrition/Meal planning;Glycemic Control;Monitoring    Special Needs None    Preferred Learning Style Auditory;Visual;Hands on    [place] in progress    How often do you need to have someone help you when you read instructions, pamphlets, or other written materials from your doctor or pharmacy? 1 - Never    What is the last grade level you completed in school? high school      Pre-Education Assessment   Patient understands the diabetes disease and treatment process. Needs Instruction    Patient understands incorporating nutritional management into lifestyle. Needs Instruction    Patient undertands incorporating physical activity into lifestyle. Needs Instruction    Patient understands using  medications safely. Needs Instruction    Patient understands monitoring blood glucose, interpreting and using results Needs Review    Patient understands prevention, detection, and treatment of acute complications. Needs Instruction    Patient understands prevention, detection, and treatment of chronic complications. Needs Instruction    Patient understands how to develop strategies to address psychosocial issues. Needs Instruction    Patient understands how to develop strategies to promote health/change behavior. Needs Instruction      Complications   How often do you check your blood sugar? 3-4 times/day    Fasting Blood glucose range (mg/dL) 70-129   FBG's 103-109 mg/dL   Postprandial Blood glucose range (mg/dL) 70-129;130-179   pp's lunch 100, 104, 114, 118 mg/dL; pp's supper 163, 104 mg/dL; bedtime 156, 134, 118, 93 mg/dL   Number of hyperglycemic episodes per week 1   Pt reports she went to the ER a few weeks ago for a BG of over 200.   Can you tell when your blood sugar is high? No    Have you had a dilated eye exam in the past 12 months? No    Have you had a dental exam in the past 12 months? No    Are you checking your feet? No      Dietary Intake   Breakfast eggs, bacon, strawberries or apples    Snack (morning) 0-2 snacks/day beef jerky, nuts, cheese, apples and peanut butter    Lunch salad with chicken and apples;  bowl from [employer] rice, beans, lettuce, chicken, spinach, peppers, onions    Dinner chicken, occasional beef, [place], peas, beans, broccoli, rice, brussels sprouts, spinach, green beans    Beverage(s) water      Exercise   Exercise Type Light (walking / raking leaves)    How many days per week to you exercise? 2    How many minutes per day do you exercise? 20    Total minutes per week of exercise 40      Patient Education   Previous Diabetes Education No    Disease state  Definition of diabetes, type 1 and 2, and the diagnosis of diabetes;Factors that contribute to  the development of diabetes    Nutrition management  Role of diet in the treatment of diabetes and the relationship between the three main macronutrients and blood glucose level;Food label reading, portion sizes and measuring food.;Reviewed blood glucose goals for pre and post meals and how to evaluate the patients' food intake on their blood glucose level.    Physical activity and exercise  Role of exercise on diabetes management, blood pressure control and cardiac health.    Medications Other (comment)   Limited use of oral medications during pregnancy and potential for insulin.   Monitoring Purpose and frequency of SMBG.;Taught/discussed recording of test results and interpretation of SMBG.;Ketone testing, when, how.    Chronic complications Relationship between chronic complications and blood glucose control    Psychosocial adjustment Identified and addressed patients feelings and concerns about diabetes    Preconception care Pregnancy and GDM  Role of pre-pregnancy blood glucose control on the development of the fetus;Reviewed with patient blood glucose goals with pregnancy;Role of family planning for patients with diabetes      Individualized Goals (developed by patient)   Reducing Risk Other (comment)   improve blood sugars, prevent diabetes complications     Outcomes   Expected Outcomes Demonstrated interest in learning. Expect positive outcomes           Individualized Plan for Diabetes Self-Management Training:   Learning Objective:  Patient will have a greater understanding of diabetes self-management. Patient education plan is to attend individual and/or group sessions per assessed needs and concerns.   Plan:   Patient Instructions  Read booklet on Gestational Diabetes Follow Gestational Meal Planning Guidelines Avoid fruit for breakfast if blood sugars elevated Complete a 3 Day Food Record and bring to next appointment Check blood sugars 4 x day - before breakfast and 2  hrs after every meal and record  Bring blood sugar log to all appointments Purchase urine ketone strips if instructed by MD and check urine ketones every am:  If + increase bedtime snack to 1 protein and 2 carbohydrate servings Walk 20-30 minutes at least 5 x week if permitted by MD  Expected Outcomes:  Demonstrated interest in learning. Expect positive outcomes  Education material provided:  Gestational Booklet Gestational Meal Planning Guidelines Simple Meal Plan Viewed Gestational Diabetes Video 3 Day Food Record Goals for a Healthy Pregnancy  If problems or questions, patient to contact team via:  Johny Drilling, RN, [place], [place] [phone]  Future DSME appointment:  October 03, 2019 with the dietitian

## 2019-09-27 NOTE — Patient Instructions (Signed)
Read booklet on Gestational Diabetes Follow Gestational Meal Planning Guidelines Avoid fruit for breakfast if blood sugars elevated Complete a 3 Day Food Record and bring to next appointment Check blood sugars 4 x day - before breakfast and 2 hrs after every meal and record  Bring blood sugar log to all appointments Purchase urine ketone strips if instructed by MD and check urine ketones every am:  If + increase bedtime snack to 1 protein and 2 carbohydrate servings Walk 20-30 minutes at least 5 x week if permitted by MD

## 2019-09-29 ENCOUNTER — Other Ambulatory Visit: Payer: Self-pay | Admitting: Maternal & Fetal Medicine

## 2019-09-29 DIAGNOSIS — O24419 Gestational diabetes mellitus in pregnancy, unspecified control: Secondary | ICD-10-CM

## 2019-09-29 DIAGNOSIS — O99210 Obesity complicating pregnancy, unspecified trimester: Secondary | ICD-10-CM

## 2019-09-30 ENCOUNTER — Ambulatory Visit (INDEPENDENT_AMBULATORY_CARE_PROVIDER_SITE_OTHER): Payer: BC Managed Care – PPO | Admitting: Obstetrics and Gynecology

## 2019-09-30 ENCOUNTER — Other Ambulatory Visit: Payer: Self-pay

## 2019-09-30 ENCOUNTER — Encounter: Payer: Self-pay | Admitting: General Practice

## 2019-09-30 ENCOUNTER — Encounter: Payer: Self-pay | Admitting: Obstetrics and Gynecology

## 2019-09-30 ENCOUNTER — Other Ambulatory Visit (HOSPITAL_COMMUNITY)
Admission: RE | Admit: 2019-09-30 | Discharge: 2019-09-30 | Disposition: A | Discharge status: Home / Self Care | Payer: BC Managed Care – PPO | Source: Ambulatory Visit | Attending: Obstetrics and Gynecology | Admitting: Obstetrics and Gynecology

## 2019-09-30 VITALS — BP 121/82 | HR 123 | Temp 97.8°F | Wt 254.0 lb

## 2019-09-30 DIAGNOSIS — Z3403 Encounter for supervision of normal first pregnancy, third trimester: Secondary | ICD-10-CM | POA: Diagnosis present

## 2019-09-30 DIAGNOSIS — Z3400 Encounter for supervision of normal first pregnancy, unspecified trimester: Secondary | ICD-10-CM

## 2019-09-30 DIAGNOSIS — Z3A36 36 weeks gestation of pregnancy: Secondary | ICD-10-CM

## 2019-09-30 DIAGNOSIS — O24410 Gestational diabetes mellitus in pregnancy, diet controlled: Secondary | ICD-10-CM

## 2019-09-30 NOTE — Patient Instructions (Signed)
Gestational Diabetes Mellitus, Diagnosis Gestational diabetes (gestational diabetes mellitus) is a short-term (temporary) form of diabetes that can happen during pregnancy. It goes away after you give birth. It may be caused by one or both of these problems:  Your pancreas does not make enough of a hormone called insulin.  Your body does not respond in a normal way to insulin that it makes. Insulin lets sugars (glucose) go into cells in the body. This gives you energy. If you have diabetes, sugars cannot get into cells. This causes high blood sugar (hyperglycemia). If you get gestational diabetes, you are:  More likely to get it if you get pregnant again.  More likely to develop type 2 diabetes in the future. If gestational diabetes is treated, it may not hurt you or your baby. Your doctor will set treatment goals for you. In general, you should have these blood sugar levels:  After not eating for a long time (fasting): 95 mg/dL (5.3 mmol/L).  After meals (postprandial): ? One hour after a meal: at or below 140 mg/dL (7.8 mmol/L). ? Two hours after a meal: at or below 120 mg/dL (6.7 mmol/L).  A1c (hemoglobin A1c) level: 6-6.5%. Follow these instructions at home: Questions to ask your doctor   You may want to ask these questions: ? Do I need to meet with a diabetes educator? ? What equipment will I need to care for myself at home? ? What medicines do I need? When should I take them? ? How often do I need to check my blood sugar? ? What number can I call if I have questions? ? When is my next doctor's visit? General instructions  Take over-the-counter and prescription medicines only as told by your doctor.  Stay at a healthy weight during pregnancy.  Keep all follow-up visits as told by your doctor. This is important. Contact a doctor if:  Your blood sugar is at or above 240 mg/dL (13.3 mmol/L).  Your blood sugar is at or above 200 mg/dL (11.1 mmol/L) and you have ketones in  your pee (urine).  You have been sick or have had a fever for 2 days or more and you are not getting better.  You have any of these problems for more than 6 hours: ? You cannot eat or drink. ? You feel sick to your stomach (nauseous). ? You throw up (vomit). ? You have watery poop (diarrhea). Get help right away if:  Your blood sugar is lower than 54 mg/dL (3 mmol/L).  You get confused.  You have trouble: ? Thinking clearly. ? Breathing.  Your baby moves less than normal.  You have any of these: ? Moderate or large ketone levels in your pee. ? Blood coming from your vagina. ? Unusual fluid coming from your vagina. ? Early contractions. These may feel like tightness in your belly. Summary  Gestational diabetes is a short-term form of diabetes. It can happen while you are pregnant. It goes away after you give birth.  If gestational diabetes is treated, it may not hurt you or your baby. Your doctor will set treatment goals for you.  Keep all follow-up visits as told by your doctor. This is important. This information is not intended to replace advice given to you by your health care provider. Make sure you discuss any questions you have with your health care provider. Document Revised: 02/05/2017 Document Reviewed: 02/02/2015 Elsevier Patient Education  2020 [employer].

## 2019-09-30 NOTE — Progress Notes (Signed)
   LOW-RISK PREGNANCY OFFICE VISIT Patient name: Kim Crosby MRN [id number]  Date of birth: 05/11/2000 Chief Complaint:   Routine Prenatal Visit  History of Present Illness:   Kim Crosby is a 19 y.o. G1P0 female at [redacted]w[redacted]d with an Estimated Date of Delivery: 10/27/19 being seen today for ongoing management of a low-risk pregnancy.  Today she reports no complaints. Contractions: Not present. Vag. Bleeding: None.  Movement: Present. denies leaking of fluid. Review of Systems:   Pertinent items are noted in HPI Denies abnormal vaginal discharge w/ itching/odor/irritation, headaches, visual changes, shortness of breath, chest pain, abdominal pain, severe nausea/vomiting, or problems with urination or bowel movements unless otherwise stated above. Pertinent History Reviewed:  Reviewed past medical,surgical, social, obstetrical and family history.  Reviewed problem list, medications and allergies. Physical Assessment:   Vitals:   09/30/19 1144  BP: 121/82  Pulse: (!) 123  Temp: 97.8 F (36.6 C)  Weight: 254 lb (115.2 kg)  Body mass index is 44.29 kg/m.        Physical Examination:   General appearance: Well appearing, and in no distress  Mental status: Alert, oriented to person, place, and time  Skin: Warm & dry  Cardiovascular: Normal heart rate noted  Respiratory: Normal respiratory effort, no distress  Abdomen: Soft, gravid, nontender  Pelvic: Cervical exam deferred         Extremities: Edema: None  Fetal Status: Fetal Heart Rate (bpm): 155   Movement: Present    No results found for this or any previous visit (from the past 24 hour(s)).  Assessment & Plan:  1) Low-risk pregnancy G1P0 at [redacted]w[redacted]d with an Estimated Date of Delivery: 10/27/19   2) Supervision of normal first pregnancy, antepartum  - Urine cytology ancillary only([place]) -- TOC  3) Diet controlled gestational diabetes mellitus (GDM) in third trimester - Review of Blood Sugar Levels: FBS range = 96-116  mg/dL  2 hr PP range = 100-170 - Has an appt with dietician on 10/03/2019 - Advised that if BS continue to be out of range like this, she will no lnger be a candidate for waterbirth  4) [redacted] weeks gestation of pregnancy    Meds: No orders of the defined types were placed in this encounter.  Labs/procedures today: none  Plan:  Continue routine obstetrical care   Reviewed: Preterm labor symptoms and general obstetric precautions including but not limited to vaginal bleeding, contractions, leaking of fluid and fetal movement were reviewed in detail with the patient.  All questions were answered. Has home bp cuff. Check bp weekly, let us know if >140/90.   Follow-up: Return in about 1 week (around 10/07/2019) for Return OB visit.  No orders of the defined types were placed in this encounter.  Laury Deep MSN, CNM 09/30/2019 12:18 PM

## 2019-10-03 ENCOUNTER — Other Ambulatory Visit: Payer: BC Managed Care – PPO

## 2019-10-03 ENCOUNTER — Encounter: Payer: Self-pay | Admitting: Dietician

## 2019-10-03 ENCOUNTER — Encounter: Payer: Self-pay | Admitting: General Practice

## 2019-10-03 ENCOUNTER — Encounter: Payer: BC Managed Care – PPO | Admitting: Dietician

## 2019-10-03 ENCOUNTER — Other Ambulatory Visit: Payer: Self-pay

## 2019-10-03 VITALS — Ht 63.5 in | Wt 251.8 lb

## 2019-10-03 DIAGNOSIS — Z3A00 Weeks of gestation of pregnancy not specified: Secondary | ICD-10-CM | POA: Diagnosis not present

## 2019-10-03 DIAGNOSIS — O24410 Gestational diabetes mellitus in pregnancy, diet controlled: Secondary | ICD-10-CM

## 2019-10-03 NOTE — Progress Notes (Deleted)
Pt was a "No Show" for her appt today at MFM @ [facility] due to transportation problems.  She did reschedule it for next Monday.

## 2019-10-03 NOTE — Patient Instructions (Signed)
   Great job making healthy food choices, keep it up!  If you have a late supper, keep it small. Try having an afternoon snack with carb and protein to help control hunger if needed.   Do some light movement/ activity for a few minutes after eating to help with blood sugar control. Several times of short activity helps blood sugar as much as one longer time of exercise.

## 2019-10-03 NOTE — Telephone Encounter (Signed)
Duplicate encounter

## 2019-10-03 NOTE — Progress Notes (Signed)
.   Patient's BG record indicates fasting BGs ranging 91-117, and post-meal BGs ranging 94-128 + one reading of 170. Marland Kitchen Patient's food diary indicates controlled, sometimes low, carb intake, whole grain choices, low carb vegetables, and protein sources with each meal. Some suppers are late due to work schedule, occasionally as late as 10-11pm. . Provided balanced meal plan, and wrote individualized menus based on patient's food preferences. . Discussed effects of late meal on fasting BGs and encouraged small meal if late, with afternoon snack to control evening hunger.  . Instructed patient on food safety, including avoidance of Listeriosis, and limiting mercury from fish. . Discussed importance of maintaining healthy lifestyle habits to reduce risk of Type 2 DM as well as Gestational DM with any future pregnancies. . Advised patient to use any remaining testing supplies to test some BGs after delivery, and to have BG tested ideally annually, as well as prior to attempting future pregnancies.

## 2019-10-04 LAB — URINE CYTOLOGY ANCILLARY ONLY
Chlamydia: NEGATIVE
Comment: NEGATIVE
Comment: NEGATIVE
Comment: NORMAL
Neisseria Gonorrhea: NEGATIVE
Trichomonas: NEGATIVE

## 2019-10-06 ENCOUNTER — Other Ambulatory Visit: Payer: Self-pay

## 2019-10-06 ENCOUNTER — Ambulatory Visit (INDEPENDENT_AMBULATORY_CARE_PROVIDER_SITE_OTHER): Payer: BC Managed Care – PPO | Admitting: Obstetrics and Gynecology

## 2019-10-06 VITALS — BP 117/76 | HR 116 | Temp 98.2°F | Wt 253.8 lb

## 2019-10-06 DIAGNOSIS — Z3400 Encounter for supervision of normal first pregnancy, unspecified trimester: Secondary | ICD-10-CM

## 2019-10-06 DIAGNOSIS — Z3A37 37 weeks gestation of pregnancy: Secondary | ICD-10-CM

## 2019-10-06 DIAGNOSIS — O24410 Gestational diabetes mellitus in pregnancy, diet controlled: Secondary | ICD-10-CM

## 2019-10-08 ENCOUNTER — Encounter: Payer: Self-pay | Admitting: Obstetrics and Gynecology

## 2019-10-08 NOTE — Progress Notes (Signed)
LOW-RISK PREGNANCY OFFICE VISIT Patient name: CHAIRTY TOMAN MRN [id number]  Date of birth: 10/29/2000 Chief Complaint:   Routine Prenatal Visit  History of Present Illness:   Kim Crosby is a 19 y.o. G1P0 female at [redacted]w[redacted]d with an Estimated Date of Delivery: 10/27/19 being seen today for ongoing management of a low-risk pregnancy.  Today she reports no complaints. Contractions: Not present. Vag. Bleeding: None.  Movement: Present. denies leaking of fluid. Review of Systems:   Pertinent items are noted in HPI Denies abnormal vaginal discharge w/ itching/odor/irritation, headaches, visual changes, shortness of breath, chest pain, abdominal pain, severe nausea/vomiting, or problems with urination or bowel movements unless otherwise stated above. Pertinent History Reviewed:  Reviewed past medical,surgical, social, obstetrical and family history.  Reviewed problem list, medications and allergies. Physical Assessment:   Vitals:   10/06/19 1401  BP: 117/76  Pulse: (!) 116  Temp: 98.2 F (36.8 C)  Weight: 253 lb 12.8 oz (115.1 kg)  Body mass index is 44.25 kg/m.        Physical Examination:   General appearance: Well appearing, and in no distress  Mental status: Alert, oriented to person, place, and time  Skin: Warm & dry  Cardiovascular: Normal heart rate noted  Respiratory: Normal respiratory effort, no distress  Abdomen: Soft, gravid, nontender  Pelvic: Cervical exam deferred         Extremities: Edema: None  Fetal Status: Fetal Heart Rate (bpm): 156   Movement: Present    Narrative & Impression  ----------------------------------------------------------------------  OBSTETRICS REPORT                       (Signed Final 09/26/2019 11:58 am) ---------------------------------------------------------------------- Patient Info  ID #:       [id number]                          D.O.B.:  2000/02/13 (19 yrs)  Name:       Kim Crosby                  Visit Date: 09/26/2019 11:11  am ---------------------------------------------------------------------- Performed By  Attending:        Sander Nephew      Referred By:       [facility]                    MD  Performed By:     Ilda Basset         [facility]          [facility]                                                              [facility]  [facility] ---------------------------------------------------------------------- Orders  #  Description                           Code        Ordered By  1  [place] MFM OB FOLLOW UP                   [id number]    RAVI SHANKAR  2  [place] MFM FETAL BPP WO NON               76819.01    RAVI [facility]     STRESS ----------------------------------------------------------------------  #  Order #                     Accession #                Episode #  1  [id number]                   [id number]                 [id number]  2  [id number]                   [id number]                 [id number] ---------------------------------------------------------------------- Indications  Obesity complicating pregnancy, third           O99.213  trimester  Gestational diabetes in pregnancy, diet         O24.410  controlled  Fetal abnormality - other known or              O35.9XX0  suspected (absent NB)  Polyhydramnios, third trimester, antepartum     O40.3XX0  condition or complication, unspecified fetus  [redacted] weeks gestation of pregnancy                 Z3A.35 ---------------------------------------------------------------------- Fetal Evaluation  Num Of Fetuses:          1  Fetal Heart              147  Rate(bpm):  Cardiac Activity:        Observed  Presentation:            Cephalic  Placenta:                Anterior  Amniotic Fluid  AFI FV:      Polyhydramnios  AFI Sum(cm)     %Tile       Largest Pocket(cm)  26.4            96          10.1   RUQ(cm)       RLQ(cm)       LUQ(cm)        LLQ(cm)  10.1          5.5           4.6            6.2 ---------------------------------------------------------------------- Biophysical Evaluation  Amniotic F.V:   Within normal limits       F. Tone:         Observed  F. Movement:    Observed                   Score:           8/8  F. Breathing:   Observed ---------------------------------------------------------------------- Biometry  BPD:      91.4  mm     G. Age:  37w 1d         90  %    CI:         77.15  %    70 - 86                                                          FL/HC:       21.0  %    20.1 - 22.1  HC:      329.5  mm     G. Age:  37w 3d         21  %    HC/AC:       0.98       0.93 - 1.11  AC:      336.3  mm     G. Age:  37w 4d         96  %    FL/BPD:      75.6  %    71 - 87  FL:       69.1  mm     G. Age:  35w 3d         41  %    FL/AC:       20.5  %    20 - 24  HUM:      62.3  mm     G. Age:  36w 1d         74  %  Est. FW:    3085   g    6 lb 13 oz      85  %                     m ---------------------------------------------------------------------- OB History  Gravidity:    1         Term:   0        Prem:   0         SAB:   0  TOP:          0       Ectopic:  0        Living: 0 ---------------------------------------------------------------------- Gestational Age  LMP:           35w 0d        Date:  01/24/19                 EDD:    10/31/19  U/S Today:     36w 6d                                        EDD:    10/18/19  Best:          35w 4d     Det. ByLoman Chroman         EDD:    10/27/19                                      (03/02/19) ---------------------------------------------------------------------- Anatomy  Cranium:  Appears normal         LVOT:                   Previously seen  Cavum:                 Appears normal         Aortic Arch:            Previously seen  Ventricles:            Appears normal         Ductal Arch:             Previously seen  Choroid Plexus:        Previously seen        Diaphragm:              Previously seen  Cerebellum:            Previously seen        Stomach:                Appears normal,                                                                        left sided  Posterior Fossa:       Previously seen        Abdomen:                Previously seen  Nuchal Fold:           Previously seen        Abdominal Wall:         Previously seen  Face:                  Absent nasal bone      Cord Vessels:           Previously seen  Lips:                  Previously seen        Kidneys:                Appear normal  Palate:                Previously seen        Bladder:                Appears normal  Thoracic:              Previously seen        Spine:                  Previously seen  Heart:                 Appears normal         Upper Extremities:      Previously seen                         (4CH, axis, and                         situs)  RVOT:  Appears normal         Lower Extremities:      Previously seen ---------------------------------------------------------------------- Impression  Follow up growth with known O0AYO with uncertain  documented blood glucose levels  Normal interval growth with measurements consistent with  dates.  A biophysical profile of 8/8 was observed with good fetal  movement however, polyhydramnios is again noted.  (elevated two visits ago last visit was normal).  She has not received diabetic teaching at this time it was  scheduled with the lifestyle center tomorrow at 3 pm.  We  reviewed diet and exercise today. While she did not have her  blood sugar logs she reported her FBS in the 80-90's and her  1hr GTT less than 140's.  Random blood sugar today was 78 mg/dL. ---------------------------------------------------------------------- Recommendations  Continue weekly testing  Consider delivery between 37-39 weeks pending fetal testing  and  maternal blood sugars. ----------------------------------------------------------------------               Sander Nephew, MD Electronically Signed Final Report   09/26/2019 11:58 am ----------------------------------------------------------------------    Assessment & Plan:  1) Low-risk pregnancy G1P0 at [redacted]w[redacted]d with an Estimated Date of Delivery: 10/27/19   2) Supervision of normal first pregnancy, antepartum - Reviewed labor precautions  3) Diet controlled gestational diabetes mellitus (GDM) in third trimester - Reports that she forgot her BS log at home; unable to recall any of numbers - Reports that numbers are "better since meeting with dietician"  4) [redacted] weeks gestation of pregnancy    Meds: No orders of the defined types were placed in this encounter.  Labs/procedures today: none  Plan:  Continue routine obstetrical care   Reviewed: Term labor symptoms and general obstetric precautions including but not limited to vaginal bleeding, contractions, leaking of fluid and fetal movement were reviewed in detail with the patient.  All questions were answered. Has home bp cuff. Check bp weekly, let us know if >140/90.   Follow-up: No follow-ups on file.  No orders of the defined types were placed in this encounter.  Laury Deep MSN, CNM 10/06/2019 11:50 PM

## 2019-10-10 ENCOUNTER — Ambulatory Visit: Payer: BC Managed Care – PPO | Attending: Obstetrics and Gynecology

## 2019-10-10 ENCOUNTER — Other Ambulatory Visit: Payer: Self-pay

## 2019-10-10 DIAGNOSIS — O99213 Obesity complicating pregnancy, third trimester: Secondary | ICD-10-CM | POA: Diagnosis not present

## 2019-10-10 DIAGNOSIS — O99210 Obesity complicating pregnancy, unspecified trimester: Secondary | ICD-10-CM

## 2019-10-10 DIAGNOSIS — O403XX0 Polyhydramnios, third trimester, not applicable or unspecified: Secondary | ICD-10-CM | POA: Diagnosis not present

## 2019-10-10 DIAGNOSIS — Z3A37 37 weeks gestation of pregnancy: Secondary | ICD-10-CM | POA: Diagnosis not present

## 2019-10-10 DIAGNOSIS — O24419 Gestational diabetes mellitus in pregnancy, unspecified control: Secondary | ICD-10-CM

## 2019-10-10 DIAGNOSIS — O24410 Gestational diabetes mellitus in pregnancy, diet controlled: Secondary | ICD-10-CM | POA: Diagnosis not present

## 2019-10-10 DIAGNOSIS — O359XX0 Maternal care for (suspected) fetal abnormality and damage, unspecified, not applicable or unspecified: Secondary | ICD-10-CM | POA: Insufficient documentation

## 2019-10-10 DIAGNOSIS — R8271 Bacteriuria: Secondary | ICD-10-CM

## 2019-10-10 DIAGNOSIS — Z3400 Encounter for supervision of normal first pregnancy, unspecified trimester: Secondary | ICD-10-CM

## 2019-10-11 ENCOUNTER — Other Ambulatory Visit: Payer: Self-pay | Admitting: *Deleted

## 2019-10-11 DIAGNOSIS — O24410 Gestational diabetes mellitus in pregnancy, diet controlled: Secondary | ICD-10-CM

## 2019-10-17 ENCOUNTER — Other Ambulatory Visit: Payer: Self-pay

## 2019-10-17 ENCOUNTER — Ambulatory Visit: Payer: BC Managed Care – PPO | Attending: Obstetrics and Gynecology

## 2019-10-17 ENCOUNTER — Encounter: Payer: Self-pay | Admitting: *Deleted

## 2019-10-17 ENCOUNTER — Ambulatory Visit: Payer: BC Managed Care – PPO | Admitting: *Deleted

## 2019-10-17 DIAGNOSIS — Z3400 Encounter for supervision of normal first pregnancy, unspecified trimester: Secondary | ICD-10-CM | POA: Diagnosis not present

## 2019-10-17 DIAGNOSIS — O403XX0 Polyhydramnios, third trimester, not applicable or unspecified: Secondary | ICD-10-CM | POA: Diagnosis not present

## 2019-10-17 DIAGNOSIS — O99213 Obesity complicating pregnancy, third trimester: Secondary | ICD-10-CM | POA: Diagnosis not present

## 2019-10-17 DIAGNOSIS — O359XX0 Maternal care for (suspected) fetal abnormality and damage, unspecified, not applicable or unspecified: Secondary | ICD-10-CM

## 2019-10-17 DIAGNOSIS — Z3A38 38 weeks gestation of pregnancy: Secondary | ICD-10-CM

## 2019-10-17 DIAGNOSIS — R8271 Bacteriuria: Secondary | ICD-10-CM | POA: Diagnosis not present

## 2019-10-17 DIAGNOSIS — O24410 Gestational diabetes mellitus in pregnancy, diet controlled: Secondary | ICD-10-CM | POA: Diagnosis not present

## 2019-10-19 ENCOUNTER — Other Ambulatory Visit: Payer: Self-pay

## 2019-10-19 ENCOUNTER — Ambulatory Visit (INDEPENDENT_AMBULATORY_CARE_PROVIDER_SITE_OTHER): Payer: BC Managed Care – PPO | Admitting: Certified Nurse Midwife

## 2019-10-19 VITALS — BP 121/71 | HR 107 | Temp 98.4°F | Wt 257.6 lb

## 2019-10-19 DIAGNOSIS — O403XX0 Polyhydramnios, third trimester, not applicable or unspecified: Secondary | ICD-10-CM | POA: Insufficient documentation

## 2019-10-19 DIAGNOSIS — O24410 Gestational diabetes mellitus in pregnancy, diet controlled: Secondary | ICD-10-CM

## 2019-10-19 DIAGNOSIS — R8271 Bacteriuria: Secondary | ICD-10-CM

## 2019-10-19 DIAGNOSIS — Z3A38 38 weeks gestation of pregnancy: Secondary | ICD-10-CM

## 2019-10-19 DIAGNOSIS — Z3400 Encounter for supervision of normal first pregnancy, unspecified trimester: Secondary | ICD-10-CM

## 2019-10-19 NOTE — Progress Notes (Signed)
   PRENATAL VISIT NOTE  Subjective:  Kim Crosby is a 19 y.o. G1P0 at 27w6dbeing seen today for ongoing prenatal care.  She is currently monitored for the following issues for this high-risk pregnancy and has Supervision of normal first pregnancy, antepartum; GBS bacteriuria; Excess weight gain in pregnancy, second trimester; Diet controlled gestational diabetes mellitus (GDM) in third trimester; and Anemia of mother in pregnancy, antepartum, third trimester on their problem list.  Patient reports occasional BH contractions.  Contractions: Irregular. Vag. Bleeding: None.  Movement: Present. Denies leaking of fluid.   The following portions of the patient's history were reviewed and updated as appropriate: allergies, current medications, past family history, past medical history, past social history, past surgical history and problem list.   Objective:   Vitals:   10/19/19 1526  BP: 121/71  Pulse: (!) 107  Temp: 98.4 F (36.9 C)  Weight: 257 lb 9.6 oz (116.8 kg)    Fetal Status: Fetal Heart Rate (bpm): 136 Fundal Height: 43 cm Movement: Present     General:  Alert, oriented and cooperative. Patient is in no acute distress.  Skin: Skin is warm and dry. No rash noted.   Cardiovascular: Normal heart rate noted  Respiratory: Normal respiratory effort, no problems with respiration noted  Abdomen: Soft, gravid, appropriate for gestational age.  Pain/Pressure: Present     Pelvic: Cervical exam deferred        Extremities: Normal range of motion.  Edema: Mild pitting, slight indentation  Mental Status: Normal mood and affect. Normal behavior. Normal judgment and thought content.   Assessment and Plan:  Pregnancy: G1P0 at 356w6d. Diet controlled gestational diabetes mellitus (GDM) in third trimester - Patient not taking CBGs appropriately, sometimes she takes 1hr PP and others 2hr PP then occasional miss fasting  - Patient reports that most of the time she eats her last meal after  11pm, encouraged patient to eat last meal prior to 10pm and continue to make diet changes, patient verbalizes understanding  - Discussed with patient that she will not have waterbirth if CBGs continue to be elevated/uncontrolled or need medication, patient verbalizes understanding  Fasting: range from 88-101   2hr PP: range from 88-120 with 28% being elevated ranging of 130,160,153,136 Met with dietician on 9/20 - Follow up [place] 10/4 Est. FW:  4058  gm   8 lb 15 oz   95  % - Discussed with patient recommendation of IOL by 40 weeks, patient does not want to be induced at that time, wants to go into labor on own and would be willing to be induced on 10/18. Educated patient on GDM and affects to newborn with the increased risk of NICU admission, stillbirth or shoulder dystocia. Patient verbalizes understanding and wants to be scheduled for 10/18  2. Supervision of normal first pregnancy, antepartum - Anticipatory guidance on upcoming appointments with ROB and NST next week   3. GBS bacteriuria - treat in labor   4. [redacted] weeks gestation of pregnancy   Term labor symptoms and general obstetric precautions including but not limited to vaginal bleeding, contractions, leaking of fluid and fetal movement were reviewed in detail with the patient. Please refer to After Visit Summary for other counseling recommendations.    Future Appointments  Date Time Provider DeBerino10/14/2021  8:10 AM DaLaury DeepCNM [facility] None  10/31/2019 12:00 AM [facility]    VeLajean ManesCNM

## 2019-10-19 NOTE — Addendum Note (Signed)
Addended by: Lajean Manes on: 10/19/2019 04:58 PM   Modules accepted: Orders, SmartSet

## 2019-10-23 ENCOUNTER — Other Ambulatory Visit: Payer: Self-pay | Admitting: Family Medicine

## 2019-10-24 ENCOUNTER — Encounter (HOSPITAL_COMMUNITY): Payer: Self-pay | Admitting: *Deleted

## 2019-10-24 ENCOUNTER — Telehealth (HOSPITAL_COMMUNITY): Payer: Self-pay | Admitting: *Deleted

## 2019-10-24 NOTE — Telephone Encounter (Signed)
Preadmission screen  

## 2019-10-25 ENCOUNTER — Encounter: Payer: Self-pay | Admitting: General Practice

## 2019-10-27 ENCOUNTER — Ambulatory Visit (INDEPENDENT_AMBULATORY_CARE_PROVIDER_SITE_OTHER): Payer: BC Managed Care – PPO | Admitting: Obstetrics and Gynecology

## 2019-10-27 ENCOUNTER — Encounter: Payer: Self-pay | Admitting: Obstetrics and Gynecology

## 2019-10-27 ENCOUNTER — Encounter: Payer: Self-pay | Admitting: General Practice

## 2019-10-27 ENCOUNTER — Other Ambulatory Visit: Payer: Self-pay

## 2019-10-27 VITALS — BP 110/72 | HR 121 | Temp 97.9°F | Wt 253.2 lb

## 2019-10-27 DIAGNOSIS — Z3A40 40 weeks gestation of pregnancy: Secondary | ICD-10-CM

## 2019-10-27 DIAGNOSIS — Z3400 Encounter for supervision of normal first pregnancy, unspecified trimester: Secondary | ICD-10-CM

## 2019-10-27 DIAGNOSIS — R8271 Bacteriuria: Secondary | ICD-10-CM

## 2019-10-27 DIAGNOSIS — O24410 Gestational diabetes mellitus in pregnancy, diet controlled: Secondary | ICD-10-CM

## 2019-10-27 NOTE — Progress Notes (Addendum)
  [place] OFFICE VISIT Patient name: Kim Crosby MRN [id number]  Date of birth: [birth year] Chief Complaint:   Routine Prenatal Visit  History of Present Illness:   Kim Crosby is a 19 y.o. G1P0 female at [redacted]w[redacted]d with an Estimated Date of Delivery: 10/27/19 being seen today for ongoing management of a high-risk pregnancy complicated by poorly controlled A1DM and obesity. Today she reports occasional contractions. Contractions: Not present. Vag. Bleeding: None.  Movement: Present. denies leaking of fluid.  Review of Systems:   Pertinent items are noted in HPI Denies abnormal vaginal discharge w/ itching/odor/irritation, headaches, visual changes, shortness of breath, chest pain, abdominal pain, severe nausea/vomiting, or problems with urination or bowel movements unless otherwise stated above. Pertinent History Reviewed:  Reviewed past medical,surgical, social, obstetrical and family history.  Reviewed problem list, medications and allergies. Physical Assessment:   Vitals:   10/27/19 0822  BP: 110/72  Pulse: (!) 121  Temp: 97.9 F (36.6 C)  Weight: 253 lb 3.2 oz (114.9 kg)  Body mass index is 44.15 kg/m.           Physical Examination:   General appearance: alert, well appearing, and in no distress, oriented to person, place, and time and overweight  Mental status: alert, oriented to person, place, and time, normal mood, behavior, speech, dress, motor activity, and thought processes  Skin: warm & dry   Extremities: Edema: Mild pitting, slight indentation    Cardiovascular: normal heart rate noted  Respiratory: normal respiratory effort, no distress  Abdomen: gravid, soft, non-tender  Pelvic: Cervical exam performed  Dilation: 2 Effacement (%): 80 Station: -2  Fetal Status: Fetal Heart Rate (bpm): 156 Fundal Height: 40 cm Movement: Present Presentation: Vertex  Fetal Surveillance Testing today: REACTIVE NST - FHR: 145 bpm / moderate variability / accels present /  decels absent / TOCO: regular every 4-7 mins    No results found for this or any previous visit (from the past 24 hour(s)).  Assessment & Plan:  1) High-risk pregnancy G1P0 at [redacted]w[redacted]d with an Estimated Date of Delivery: 10/27/19   2) Supervision of normal first pregnancy, antepartum - IOL scheduled for 10/31/2019 at midnight  3) GBS bacteriuria - Advised will need IV abx in labor  4) Diet controlled gestational diabetes mellitus (GDM) in third trimester - Poorly controlled and not checking CBGs appropriately - Review of Blood Sugar Levels: FBS range = 89-103 mg/dL  2 hr PP range = 112-181 - Advised she is NO LONGER A WATERBIRTH CANDIDATE - Patient verbalized an understanding.   5) [redacted] weeks gestation of pregnancy    Meds: No orders of the defined types were placed in this encounter.   Labs/procedures today: NST and cervical exam  Treatment Plan:  IOL on 10/31/19  Reviewed: Term labor symptoms and general obstetric precautions including but not limited to vaginal bleeding, contractions, leaking of fluid and fetal movement were reviewed in detail with the patient.  All questions were answered. Has home bp cuff. Check bp weekly, let us know if >140/90.   Follow-up: No follow-ups on file.  No orders of the defined types were placed in this encounter.  Laury Deep MSN, CNM 10/27/2019 9:38 AM

## 2019-10-27 NOTE — Patient Instructions (Signed)

## 2019-10-29 ENCOUNTER — Other Ambulatory Visit (HOSPITAL_COMMUNITY)
Admission: RE | Admit: 2019-10-29 | Discharge: 2019-10-29 | Disposition: A | Discharge status: Home / Self Care | Payer: BC Managed Care – PPO | Source: Ambulatory Visit | Attending: Family Medicine | Admitting: Family Medicine

## 2019-10-29 DIAGNOSIS — Z01812 Encounter for preprocedural laboratory examination: Secondary | ICD-10-CM | POA: Insufficient documentation

## 2019-10-29 DIAGNOSIS — Z20822 Contact with and (suspected) exposure to covid-19: Secondary | ICD-10-CM | POA: Insufficient documentation

## 2019-10-29 LAB — SARS CORONAVIRUS 2 (TAT 6-24 HRS): SARS Coronavirus 2: NEGATIVE

## 2019-10-30 ENCOUNTER — Inpatient Hospital Stay (HOSPITAL_COMMUNITY)
Admission: AD | Admit: 2019-10-30 | Discharge: 2019-11-02 | DRG: 805 | Disposition: A | Discharge status: Home / Self Care | Payer: BC Managed Care – PPO | Attending: Family Medicine | Admitting: Family Medicine

## 2019-10-30 ENCOUNTER — Encounter (HOSPITAL_COMMUNITY): Payer: Self-pay | Admitting: Obstetrics & Gynecology

## 2019-10-30 ENCOUNTER — Inpatient Hospital Stay (HOSPITAL_COMMUNITY): Payer: BC Managed Care – PPO | Admitting: Anesthesiology

## 2019-10-30 ENCOUNTER — Other Ambulatory Visit: Payer: Self-pay

## 2019-10-30 DIAGNOSIS — O24415 Gestational diabetes mellitus in pregnancy, controlled by oral hypoglycemic drugs: Secondary | ICD-10-CM | POA: Diagnosis present

## 2019-10-30 DIAGNOSIS — O24420 Gestational diabetes mellitus in childbirth, diet controlled: Secondary | ICD-10-CM | POA: Diagnosis not present

## 2019-10-30 DIAGNOSIS — Z20822 Contact with and (suspected) exposure to covid-19: Secondary | ICD-10-CM | POA: Diagnosis present

## 2019-10-30 DIAGNOSIS — O403XX0 Polyhydramnios, third trimester, not applicable or unspecified: Principal | ICD-10-CM | POA: Diagnosis present

## 2019-10-30 DIAGNOSIS — O24419 Gestational diabetes mellitus in pregnancy, unspecified control: Secondary | ICD-10-CM | POA: Diagnosis present

## 2019-10-30 DIAGNOSIS — O24410 Gestational diabetes mellitus in pregnancy, diet controlled: Secondary | ICD-10-CM | POA: Diagnosis present

## 2019-10-30 DIAGNOSIS — O9902 Anemia complicating childbirth: Secondary | ICD-10-CM | POA: Diagnosis not present

## 2019-10-30 DIAGNOSIS — D649 Anemia, unspecified: Secondary | ICD-10-CM | POA: Diagnosis not present

## 2019-10-30 DIAGNOSIS — O99824 Streptococcus B carrier state complicating childbirth: Secondary | ICD-10-CM | POA: Diagnosis not present

## 2019-10-30 DIAGNOSIS — O411230 Chorioamnionitis, third trimester, not applicable or unspecified: Secondary | ICD-10-CM | POA: Diagnosis not present

## 2019-10-30 DIAGNOSIS — Z3400 Encounter for supervision of normal first pregnancy, unspecified trimester: Secondary | ICD-10-CM

## 2019-10-30 DIAGNOSIS — Z3A40 40 weeks gestation of pregnancy: Secondary | ICD-10-CM | POA: Diagnosis not present

## 2019-10-30 DIAGNOSIS — B951 Streptococcus, group B, as the cause of diseases classified elsewhere: Secondary | ICD-10-CM | POA: Diagnosis not present

## 2019-10-30 DIAGNOSIS — O26893 Other specified pregnancy related conditions, third trimester: Secondary | ICD-10-CM | POA: Diagnosis not present

## 2019-10-30 DIAGNOSIS — R8271 Bacteriuria: Secondary | ICD-10-CM | POA: Diagnosis present

## 2019-10-30 DIAGNOSIS — O99013 Anemia complicating pregnancy, third trimester: Secondary | ICD-10-CM | POA: Diagnosis present

## 2019-10-30 DIAGNOSIS — Z3490 Encounter for supervision of normal pregnancy, unspecified, unspecified trimester: Secondary | ICD-10-CM | POA: Diagnosis present

## 2019-10-30 LAB — GLUCOSE, CAPILLARY
Glucose-Capillary: 84 mg/dL (ref 70–99)
Glucose-Capillary: 94 mg/dL (ref 70–99)
Glucose-Capillary: 90 mg/dL (ref 70–99)
Glucose-Capillary: 84 mg/dL (ref 70–99)

## 2019-10-30 LAB — TYPE AND SCREEN
ABO/RH(D): B POS
Antibody Screen: NEGATIVE

## 2019-10-30 LAB — CBC
HCT: 35.7 % — ABNORMAL LOW (ref 36.0–46.0)
Hemoglobin: 10.8 g/dL — ABNORMAL LOW (ref 12.0–15.0)
MCH: 23.3 pg — ABNORMAL LOW (ref 26.0–34.0)
MCHC: 30.3 g/dL (ref 30.0–36.0)
MCV: 76.9 fL — ABNORMAL LOW (ref 80.0–100.0)
Platelets: 326 10*3/uL (ref 150–400)
RBC: 4.64 MIL/uL (ref 3.87–5.11)
RDW: 16.4 % — ABNORMAL HIGH (ref 11.5–15.5)
WBC: 6.4 10*3/uL (ref 4.0–10.5)
nRBC: 0.3 % — ABNORMAL HIGH (ref 0.0–0.2)

## 2019-10-30 LAB — RPR: RPR Ser Ql: NONREACTIVE

## 2019-10-30 MED ORDER — OXYTOCIN-SODIUM CHLORIDE 30-0.9 UT/500ML-% IV SOLN
2.5000 [IU]/h | INTRAVENOUS | Status: DC
  Administered 2019-10-31: 2.5 [IU]/h via INTRAVENOUS
  Filled 2019-10-30: qty 500

## 2019-10-30 MED ORDER — OXYCODONE-ACETAMINOPHEN 5-325 MG PO TABS: 2.0000 | ORAL_TABLET | ORAL | Status: DC | PRN

## 2019-10-30 MED ORDER — FENTANYL-BUPIVACAINE-NACL 0.5-0.125-0.9 MG/250ML-% EP SOLN
EPIDURAL | Status: AC
  Filled 2019-10-30: qty 250

## 2019-10-30 MED ORDER — SODIUM CHLORIDE (PF) 0.9 % IJ SOLN
INTRAMUSCULAR | Status: DC | PRN
Start: 2019-10-30 — End: 2019-10-31
  Administered 2019-10-30: 12 mL/h via EPIDURAL

## 2019-10-30 MED ORDER — MISOPROSTOL 25 MCG QUARTER TABLET: 25.0000 ug | ORAL_TABLET | ORAL | Status: DC | PRN

## 2019-10-30 MED ORDER — LIDOCAINE HCL (PF) 1 % IJ SOLN
30.0000 mL | INTRAMUSCULAR | Status: AC | PRN
  Administered 2019-10-31: 30 mL via SUBCUTANEOUS
  Filled 2019-10-30: qty 30

## 2019-10-30 MED ORDER — OXYCODONE-ACETAMINOPHEN 5-325 MG PO TABS: 1.0000 | ORAL_TABLET | ORAL | Status: DC | PRN

## 2019-10-30 MED ORDER — SOD CITRATE-CITRIC ACID 500-334 MG/5ML PO SOLN: 30.0000 mL | ORAL | Status: DC | PRN

## 2019-10-30 MED ORDER — LACTATED RINGERS IV SOLN: INTRAVENOUS | Status: DC

## 2019-10-30 MED ORDER — OXYTOCIN BOLUS FROM INFUSION
333.0000 mL | Freq: Once | INTRAVENOUS | Status: AC
  Administered 2019-10-31: 333 mL via INTRAVENOUS

## 2019-10-30 MED ORDER — LIDOCAINE HCL (PF) 1 % IJ SOLN
INTRAMUSCULAR | Status: DC | PRN
  Administered 2019-10-30: 5 mL via EPIDURAL

## 2019-10-30 MED ORDER — ACETAMINOPHEN 325 MG PO TABS
650.0000 mg | ORAL_TABLET | ORAL | Status: DC | PRN
  Administered 2019-10-31: 650 mg via ORAL
  Filled 2019-10-30: qty 2

## 2019-10-30 MED ORDER — TERBUTALINE SULFATE 1 MG/ML IJ SOLN: 0.2500 mg | Freq: Once | INTRAMUSCULAR | Status: DC | PRN

## 2019-10-30 MED ORDER — PENICILLIN G POT IN DEXTROSE 60000 UNIT/ML IV SOLN
3.0000 10*6.[IU] | INTRAVENOUS | Status: DC
  Administered 2019-10-30 – 2019-10-31 (×4): 3 10*6.[IU] via INTRAVENOUS
  Filled 2019-10-30 (×4): qty 50

## 2019-10-30 MED ORDER — LACTATED RINGERS IV SOLN: 500.0000 mL | INTRAVENOUS | Status: DC | PRN

## 2019-10-30 MED ORDER — SODIUM CHLORIDE 0.9 % IV SOLN
5.0000 10*6.[IU] | Freq: Once | INTRAVENOUS | Status: AC
  Administered 2019-10-30: 5 10*6.[IU] via INTRAVENOUS
  Filled 2019-10-30: qty 5

## 2019-10-30 MED ORDER — ONDANSETRON HCL 4 MG/2ML IJ SOLN
4.0000 mg | Freq: Four times a day (QID) | INTRAMUSCULAR | Status: DC | PRN
  Administered 2019-10-31: 4 mg via INTRAVENOUS
  Filled 2019-10-30: qty 2

## 2019-10-30 MED ORDER — BUPIVACAINE HCL (PF) 0.75 % IJ SOLN
INTRAMUSCULAR | Status: DC | PRN
Start: 2019-10-30 — End: 2019-10-30

## 2019-10-30 NOTE — Progress Notes (Signed)
Labor Progress Note Kim Crosby is a 19 y.o. G1P0 at [redacted]w[redacted]d presented for spontaneous onset of labor  S:  Patient states she isn't coping well with pain anymore. Wants to discuss options.   O:  BP 112/81   Pulse (!) 109   Temp 98.1 F (36.7 C) (Oral)   Resp 18   Ht 5\' 3"  (1.6 m)   Wt 114.8 kg   LMP 01/24/2019   BMI 44.82 kg/m   Fetal Tracing:  Baseline: 140 Variability: moderate Accels: 15x15 Decels: none  Toco: 2-3   CVE: Dilation: 8 Effacement (%): 90 Station: 0 Presentation: Vertex Exam by:: Petra Kuba, rn    A&P: 19 y.o. G1P0 [redacted]w[redacted]d SOL #Labor: Cervix unchanged. Discussed options at length with patient including expectant management, AROM, IV pain medication and epidural. Patient is fearful of worsening contractions with AROM. Does not desire IV fentanyl due to anticipated "high feeling" and limited use at advanced dilation. Discussed benefits of epidural in relation to delivery with anticipated large infant and overall patient comfort. Patient is considering epidural and AROM after but wants to talk to support team before making decision. RN to notify CNM.  #Pain: per patient request #FWB: Cat 1 #GBS positive  Wende Mott, CNM 9:08 PM

## 2019-10-30 NOTE — Anesthesia Procedure Notes (Signed)
Epidural Patient location during procedure: OB Start time: 10/30/2019 9:49 PM End time: 10/30/2019 10:07 PM  Staffing Anesthesiologist: Barnet Glasgow, MD Performed: anesthesiologist   Preanesthetic Checklist Completed: patient identified, IV checked, site marked, risks and benefits discussed, surgical consent, monitors and equipment checked, pre-op evaluation and timeout performed  Epidural Patient position: sitting Prep: DuraPrep and site prepped and draped Patient monitoring: continuous pulse ox and blood pressure Approach: midline Location: L3-L4 Injection technique: LOR air  Needle:  Needle type: Tuohy  Needle gauge: 17 G Needle length: 9 cm and 9 Needle insertion depth: 9 cm Catheter type: closed end flexible Catheter size: 19 Gauge Catheter at skin depth: 10 cm Test dose: negative  Assessment Events: blood not aspirated, injection not painful, no injection resistance, no paresthesia and negative IV test  Additional Notes Patient identified. Risks/Benefits/Options discussed with patient including but not limited to bleeding, infection, nerve damage, paralysis, failed block, incomplete pain control, headache, blood pressure changes, nausea, vomiting, reactions to medication both or allergic, itching and postpartum back pain. Confirmed with bedside nurse the patient's most recent platelet count. Confirmed with patient that they are not currently taking any anticoagulation, have any bleeding history or any family history of bleeding disorders. Patient expressed understanding and wished to proceed. All questions were answered. Sterile technique was used throughout the entire procedure. Please see nursing notes for vital signs. Test dose was given through epidural needle and negative prior to continuing to dose epidural or start infusion. Warning signs of high block given to the patient including shortness of breath, tingling/numbness in hands, complete motor block, or any  concerning symptoms with instructions to call for help. Patient was given instructions on fall risk and not to get out of bed. All questions and concerns addressed with instructions to call with any issues. 1 Attempt (S) . Patient tolerated procedure well.

## 2019-10-30 NOTE — MAU Note (Signed)
..  Kim Crosby is a 19 y.o. at [redacted]w[redacted]d here in MAU reporting: CTX since 0100 that are now 5 minutes apart. +FM. Denies VB or LOF. Pt states she lost her mucous plug a few days ago and has continued to have mucous discharge.

## 2019-10-30 NOTE — Anesthesia Preprocedure Evaluation (Signed)
Anesthesia Evaluation  Patient identified by MRN, date of birth, ID band Patient awake    Reviewed: Allergy & Precautions, NPO status , Patient's Chart, lab work & pertinent test results  Airway Mallampati: II  TM Distance: >3 FB Neck ROM: Full    Dental no notable dental hx. (+) Teeth Intact, Dental Advisory Given   Pulmonary neg pulmonary ROS,    Pulmonary exam normal breath sounds clear to auscultation       Cardiovascular Exercise Tolerance: Good negative cardio ROS Normal cardiovascular exam Rhythm:Regular Rate:Normal     Neuro/Psych PSYCHIATRIC DISORDERS Anxiety Depression Bipolar Disorder negative neurological ROS     GI/Hepatic negative GI ROS, Neg liver ROS,   Endo/Other  diabetes, Poorly Controlled, Gestational  Renal/GU negative Renal ROS     Musculoskeletal   Abdominal (+) + obese,   Peds  Hematology Hgb 10.8 Plt 326   Anesthesia Other Findings   Reproductive/Obstetrics (+) Pregnancy                             Anesthesia Physical Anesthesia Plan  ASA: III  Anesthesia Plan: Epidural   Post-op Pain Management:    Induction:   PONV Risk Score and Plan:   Airway Management Planned:   Additional Equipment:   Intra-op Plan:   Post-operative Plan:   Informed Consent: I have reviewed the patients History and Physical, chart, labs and discussed the procedure including the risks, benefits and alternatives for the proposed anesthesia with the patient or authorized representative who has indicated his/her understanding and acceptance.       Plan Discussed with:   Anesthesia Plan Comments: (40.3 wk G1P0 w gDM Bipolar and suspected fetal macrosomia for LEA)        Anesthesia Quick Evaluation

## 2019-10-30 NOTE — H&P (Signed)
Obstetric Admission History & Physical  Subjective Kim Crosby is a 19 y.o. female G1P0 with IUP at 52w3dby ultrasound at 8 wks admitted for spontaneous labor.  Reports fetal movement. Denies vaginal bleeding and ROM. She received her prenatal care at [facility].  Support person in labor: Kim Crosby  Ultrasounds . 38+4 4058g 95%ile, AC 99%, HC 82%  . 35+4 3085 g 85%ile, AC 96, HC 66, normal interval growth  . 30+5 1915 g 85%, Fetal PAC . 22+5 completed anatomy  . 18+5, anatomy incomplete  . 5+5 single IUP   Prenatal History/Complications: . A1GDM, uncontrolled CBGs (attended waterbirth class, certification and consent; however, not candidate d/t CBGs) . GBS bacteuria   Past Medical History:  Diagnosis Date  . Anemia   . Anxiety   . Bipolar 2 disorder ([place]   . Depression   . Gestational diabetes   . Medical history non-contributory   . Retinitis    Past Surgical History:  Procedure Laterality Date  . REFRACTIVE SURGERY Bilateral    OB History    Gravida  1   Para      Term      Preterm      AB      Living        SAB      TAB      Ectopic      Multiple      Live Births             Social History   Socioeconomic History  . Marital status: Single    Spouse name: Not on file  . Number of children: Not on file  . 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 Highest education level: Some college, no degree  Occupational History  . Not on file  Tobacco Use  . Smoking status: Never Smoker  . Smokeless tobacco: Never Used  Vaping Use  . Vaping Use: Never used  Substance and Sexual Activity  . Alcohol use: No  . Drug use: Not Currently    Types: Marijuana  . Sexual activity: Yes    Birth control/protection: None  Other Topics Concern  . Not on file  Social History Narrative  . Not on file   Social Determinants of Health   Financial Resource Strain:   . Difficulty of Paying Living Expenses: Not on file  Food Insecurity:   . Worried  About RCharity fundraiserin the Last Year: Not on file  . Ran Out of Food in the Last Year: Not on file  Transportation Needs:   . Lack of Transportation (Medical): Not on file  . Lack of Transportation (Non-Medical): Not on file  Physical Activity:   . Days of Exercise per Week: Not on file  . Minutes of Exercise per Session: Not on file  Stress:   . Feeling of Stress : Not on file  Social Connections:   . Frequency of Communication with Friends and Family: Not on file  . Frequency of Social Gatherings with Friends and Family: Not on file  . Attends Religious Services: Not on file  . Active Member of Clubs or Organizations: Not on file  . Attends CArchivistMeetings: Not on file  . Marital Status: Not on file   Family History  Problem Relation Age of Onset  . Gestational diabetes Mother        At younger age  . Asthma Father   . Diabetes Maternal Grandmother   .  Kidney disease Maternal Grandmother   . Gestational diabetes Maternal Grandmother        When younger   Allergies: No Known Allergies Medications:  Current Meds  Medication Sig  . Accu-Chek Softclix Lancets lancets 1 each by Other route 4 (four) times daily.  . Blood Glucose Monitoring Suppl (ACCU-CHEK NANO SMARTVIEW) w/Device KIT 1 kit by Subdermal route as directed. Check blood sugars for fasting, and two hours after breakfast, lunch and dinner (4 checks daily)  . ferrous sulfate 325 (65 FE) MG EC tablet Take 1 tablet (325 mg total) by mouth daily with breakfast.  . glucose blood (ACCU-CHEK SMARTVIEW) test strip Use as instructed to check blood sugars  . Prenatal Vit-Fe Fumarate-FA (PRENATAL VITAMIN PO) Take 3 tablets by mouth daily.    Review of Systems  All systems reviewed and negative except as stated in HPI  Objective Physical Exam:  Temp 97.7 F (36.5 C) (Oral)   Resp 17   Ht 5' 3"  (1.6 m)   Wt 114.8 kg   LMP 01/24/2019   BMI 44.82 kg/m  General appearance: alert, cooperative and  appears stated age Lungs: no respiratory distress Heart: RRR. No BLEE.  Abdomen: soft, non-tender; gravid  Extremities: Moving spontaneously, warm, well perfused. 2+ DP.  Uterine activity: irregular Cervical Exam:  Dilation: 5 Effacement (%): 70 Station: -2 Exam by:: Cloretta Ned RN Presentation: vertex per MAU provider Fetal monitoring: baseline 140 / moderate variability/ no decels  Prenatal labs: ABO, Rh: --/--/PENDING (10/17 9485) Antibody: PENDING (10/17 4627) Rubella: Immune (03/03 0000) RPR: Non Reactive (07/30 0841)  HBsAg: Negative (03/03 0000)  HIV: Non Reactive (07/30 0841)  GBS:  positive  Glucola: 94/169/153 Genetic screening:  LR NIPS, female, AFP screen negative   Prenatal Transfer Tool  Maternal Diabetes: Yes:  Diabetes Type:  Diet controlled Genetic Screening: Normal Maternal Ultrasounds/Referrals: Normal Fetal Ultrasounds or other Referrals:  None Maternal Substance Abuse:  No Significant Maternal Medications:  None Significant Maternal Lab Results: Group B Strep positive  Results for orders placed or performed during the hospital encounter of 10/30/19 (from the past 24 hour(s))  CBC   Collection Time: 10/30/19  6:34 AM  Result Value Ref Range   WBC 6.4 4.0 - 10.5 K/uL   RBC 4.64 3.87 - 5.11 MIL/uL   Hemoglobin 10.8 (L) 12.0 - 15.0 g/dL   HCT 35.7 (L) 36 - 46 %   MCV 76.9 (L) 80.0 - 100.0 fL   MCH 23.3 (L) 26.0 - 34.0 pg   MCHC 30.3 30.0 - 36.0 g/dL   RDW 16.4 (H) 11.5 - 15.5 %   Platelets 326 150 - 400 K/uL   nRBC 0.3 (H) 0.0 - 0.2 %  Type and screen   Collection Time: 10/30/19  6:46 AM  Result Value Ref Range   ABO/RH(D) PENDING    Antibody Screen PENDING    Sample Expiration      11/02/2019,2359 Performed at [facility], [address], [place], [place] [address]   Results for orders placed or performed during the hospital encounter of 10/29/19 (from the past 24 hour(s))  SARS CORONAVIRUS 2 (TAT 6-24 HRS) Nasopharyngeal  Nasopharyngeal Swab   Collection Time: 10/29/19  2:01 PM   Specimen: Nasopharyngeal Swab  Result Value Ref Range   SARS Coronavirus 2 NEGATIVE NEGATIVE    Patient Active Problem List   Diagnosis Date Noted  . Encounter for induction of labor 10/30/2019  . Polyhydramnios affecting pregnancy in third trimester 10/19/2019  .  Diet controlled gestational diabetes mellitus (GDM) in third trimester 08/13/2019  . Anemia of mother in pregnancy, antepartum, third trimester 08/13/2019  . GBS bacteriuria 07/27/2019  . Excess weight gain in pregnancy, second trimester 07/27/2019  . Supervision of normal first pregnancy, antepartum 05/04/2019    Assessment & Plan:  Kim Crosby is a 19 y.o. G1P0 at 17w3dadmitted for spontaneous onset labor.  Patient Active Problem List   Diagnosis Date Noted  . Encounter for induction of labor 10/30/2019  . Polyhydramnios affecting pregnancy in third trimester 10/19/2019  . Diet controlled gestational diabetes mellitus (GDM) in third trimester 08/13/2019  . Anemia of mother in pregnancy, antepartum, third trimester 08/13/2019  . GBS bacteriuria 07/27/2019  . Excess weight gain in pregnancy, second trimester 07/27/2019  . Supervision of normal first pregnancy, antepartum 05/04/2019   Labor:  Early  . Pain control: Labor support without medications . Anticipated MOD: NSVD  Fetal Wellbeing: Category I . GBS pos, PCN . Continuous fetal monitoring  Postpartum Planning . Boy (yes)/breast/Nuvaring vs OCP . Rubella immune, [facility]  A1GDM . CBG q 4 hours   RZettie Cooley M.D.  Family Medicine  PGY-3 10/30/2019 7:52 AM  Attestation of Attending Supervision of Resident: Evaluation and management procedures were performed by the [facility] Resident under my supervision. I was immediately available for direct supervision, assistance and direction throughout this encounter.  I also confirm that I have verified the information documented  in the resident's note, and that I have also personally reperformed the pertinent components of the physical exam and all of the medical decision making activities.  I have also made any necessary editorial changes.  I discussed that 2 wks after last u/s EFW is 4500 gms and she has A1 Diabetes. Discussed primary C-section is recommended. Risks of Cesarean section reviewed at length, infection, bleeding, implications for future childbearing, things we do to prevent complications including Abx, dressing, lovenox for prevention of blood clots.  Discussed Shoulder dystocia with attempts at vaginal birth, growth in diabetes where the head may deliver, but the shoulders are larger and get stuck. We discussed implications, possible complications, such as broken humerus or clavicle, injury of the brachial plexus, inability to affect delivery, possible hypoxic brain injury and even death. We also discussed EFW is an estimate and baby could be much smaller or much larger and late u/s is not great at predicting this. Discussed no vacuum, no forceps and would have low threshold for PLTCS if she is not changing. After consultation with family, she has declined Cesarean delivery today. On my exam her cervix is 4.5 cm/80/-1-2 station. She has a doula and a birth plan. Will plan for vaginal birth with staff ready, NICU present in anticipation of difficult delivery.  TDonnamae Jude MD Attending OChappell FThe [facility] LakesGroup 10/30/2019 10:21 AM

## 2019-10-30 NOTE — Progress Notes (Signed)
Labor Progress Note Kim Crosby is a 19 y.o. G1P0 at [redacted]w[redacted]d presented for Spontaneous onset of labor  S:  Patient now comfortable with epidural  O:  BP 112/63   Pulse 95   Temp 98.1 F (36.7 C) (Oral)   Resp 20   Ht 5\' 3"  (1.6 m)   Wt 114.8 kg   LMP 01/24/2019   SpO2 100%   BMI 44.82 kg/m   Fetal Tracing:  Baseline: 140 Variability: moderate Accels: 15x15 Decels: none  Toco: 3-5   CVE: Dilation: 6 Effacement (%): 100 Station: 0 Presentation: Vertex Exam by:: Chrys Racer, CNM   A&P: 19 y.o. G1P0 [redacted]w[redacted]d spontaneous onset of labor #Labor: Discussed with patient risks and benefits of AROM for augmentation of labor. Patient agreeable to plan of care. AROM with small amount of meconium stained fluid. Patient and FHR tolerated procedure well.  #Pain: epidural #FWB: Cat 1 #GBS positive  Wende Mott, CNM 10:48 PM

## 2019-10-31 ENCOUNTER — Encounter (HOSPITAL_COMMUNITY): Payer: Self-pay | Admitting: Obstetrics and Gynecology

## 2019-10-31 ENCOUNTER — Inpatient Hospital Stay (HOSPITAL_COMMUNITY): Admission: AD | Admit: 2019-10-31 | Payer: BC Managed Care – PPO | Source: Home / Self Care | Admitting: Family Medicine

## 2019-10-31 ENCOUNTER — Inpatient Hospital Stay (HOSPITAL_COMMUNITY): Payer: BC Managed Care – PPO

## 2019-10-31 DIAGNOSIS — O24420 Gestational diabetes mellitus in childbirth, diet controlled: Secondary | ICD-10-CM

## 2019-10-31 DIAGNOSIS — O99824 Streptococcus B carrier state complicating childbirth: Secondary | ICD-10-CM

## 2019-10-31 DIAGNOSIS — Z3A40 40 weeks gestation of pregnancy: Secondary | ICD-10-CM

## 2019-10-31 DIAGNOSIS — B951 Streptococcus, group B, as the cause of diseases classified elsewhere: Secondary | ICD-10-CM

## 2019-10-31 LAB — GLUCOSE, CAPILLARY
Glucose-Capillary: 112 mg/dL — ABNORMAL HIGH (ref 70–99)
Glucose-Capillary: 95 mg/dL (ref 70–99)

## 2019-10-31 MED ORDER — ONDANSETRON HCL 4 MG/2ML IJ SOLN: 4.0000 mg | INTRAMUSCULAR | Status: DC | PRN

## 2019-10-31 MED ORDER — PRENATAL MULTIVITAMIN CH
1.0000 | ORAL_TABLET | Freq: Every day | ORAL | Status: DC
  Administered 2019-10-31 – 2019-11-02 (×3): 1 via ORAL
  Filled 2019-10-31 (×3): qty 1

## 2019-10-31 MED ORDER — COCONUT OIL OIL: 1.0000 "application " | TOPICAL_OIL | Status: DC | PRN

## 2019-10-31 MED ORDER — ONDANSETRON HCL 4 MG PO TABS: 4.0000 mg | ORAL_TABLET | ORAL | Status: DC | PRN

## 2019-10-31 MED ORDER — ACETAMINOPHEN 325 MG PO TABS
650.0000 mg | ORAL_TABLET | ORAL | Status: DC | PRN
  Administered 2019-11-02: 650 mg via ORAL
  Filled 2019-10-31: qty 2

## 2019-10-31 MED ORDER — GENTAMICIN SULFATE 40 MG/ML IJ SOLN
5.0000 mg/kg | INTRAVENOUS | Status: DC
  Administered 2019-10-31: 570 mg via INTRAVENOUS
  Filled 2019-10-31: qty 14.25

## 2019-10-31 MED ORDER — IBUPROFEN 600 MG PO TABS
600.0000 mg | ORAL_TABLET | Freq: Four times a day (QID) | ORAL | Status: DC
  Administered 2019-10-31 – 2019-11-02 (×8): 600 mg via ORAL
  Filled 2019-10-31 (×9): qty 1

## 2019-10-31 MED ORDER — SODIUM CHLORIDE 0.9% FLUSH: 3.0000 mL | Freq: Two times a day (BID) | INTRAVENOUS | Status: DC

## 2019-10-31 MED ORDER — DIPHENHYDRAMINE HCL 25 MG PO CAPS: 25.0000 mg | ORAL_CAPSULE | Freq: Four times a day (QID) | ORAL | Status: DC | PRN

## 2019-10-31 MED ORDER — BENZOCAINE-MENTHOL 20-0.5 % EX AERO
1.0000 "application " | INHALATION_SPRAY | CUTANEOUS | Status: DC | PRN
  Administered 2019-10-31: 1 via TOPICAL
  Filled 2019-10-31: qty 56

## 2019-10-31 MED ORDER — SODIUM CHLORIDE 0.9 % IV SOLN
2.0000 g | Freq: Four times a day (QID) | INTRAVENOUS | Status: DC
  Administered 2019-10-31: 2 g via INTRAVENOUS
  Filled 2019-10-31: qty 2000

## 2019-10-31 MED ORDER — DIBUCAINE (PERIANAL) 1 % EX OINT: 1.0000 "application " | TOPICAL_OINTMENT | CUTANEOUS | Status: DC | PRN

## 2019-10-31 MED ORDER — SODIUM CHLORIDE 0.9% FLUSH: 3.0000 mL | INTRAVENOUS | Status: DC | PRN

## 2019-10-31 MED ORDER — WITCH HAZEL-GLYCERIN EX PADS: 1.0000 "application " | MEDICATED_PAD | CUTANEOUS | Status: DC | PRN

## 2019-10-31 MED ORDER — SODIUM CHLORIDE 0.9 % IV SOLN: 250.0000 mL | INTRAVENOUS | Status: DC | PRN

## 2019-10-31 MED ORDER — SENNOSIDES-DOCUSATE SODIUM 8.6-50 MG PO TABS
1.0000 | ORAL_TABLET | Freq: Two times a day (BID) | ORAL | Status: DC
  Administered 2019-10-31 – 2019-11-02 (×5): 1 via ORAL
  Filled 2019-10-31 (×5): qty 1

## 2019-10-31 MED ORDER — TETANUS-DIPHTH-ACELL PERTUSSIS 5-2.5-18.5 LF-MCG/0.5 IM SUSP: 0.5000 mL | Freq: Once | INTRAMUSCULAR | Status: DC

## 2019-10-31 MED ORDER — MEASLES, MUMPS & RUBELLA VAC IJ SOLR: 0.5000 mL | Freq: Once | INTRAMUSCULAR | Status: DC

## 2019-10-31 NOTE — Discharge Summary (Signed)
Postpartum Discharge Summary  Date of Service updated 11/02/19     Patient Name: Kim Crosby DOB: [birth year] MRN: [id number]  Date of admission: 10/30/2019 Delivery date:10/31/2019  Delivering provider: Wende Mott  Date of discharge: 11/02/2019  Admitting diagnosis: Encounter for induction of labor [Z34.90] Intrauterine pregnancy: [redacted]w[redacted]d    Secondary diagnosis:  Active Problems:   Supervision of normal first pregnancy, antepartum   GBS bacteriuria   Diet controlled gestational diabetes mellitus (GDM) in third trimester   Anemia of mother in pregnancy, antepartum, third trimester   Encounter for induction of labor  Additional problems: none    Discharge diagnosis: Term Pregnancy Delivered and GDM A1                                              Post partum procedures:N/A Augmentation: AROM Complications: Intrauterine Inflammation or infection (Chorioamniotis)  Hospital course: Onset of Labor With Vaginal Delivery      19y.o. yo G1P0 at 440w4das admitted in Active Labor on 10/30/2019. Patient had an uncomplicated labor course as follows:  Membrane Rupture Time/Date: 10:40 PM ,10/30/2019   Delivery Method:Vaginal, Spontaneous  Episiotomy: None  Lacerations:  Labial;2nd degree  Patient had an uncomplicated postpartum course.  She is ambulating, tolerating a regular diet, passing flatus, and urinating well. Patient is discharged home in stable condition on 11/02/19.  Newborn Data: Birth date:10/31/2019  Birth time:4:30 AM  Gender:Female  Living status:Living  Apgars:9 ,9  Weight:3824 g   Magnesium Sulfate received: No BMZ received: No Rhophylac:N/A MMR:N/A T-DaP:Given prenatally per patient Flu: No Transfusion:No  Physical exam  Vitals:   11/01/19 0827 11/01/19 1412 11/01/19 2200 11/02/19 0517  BP: 119/77 138/81 128/74 130/73  Pulse: 83 (!) 112 97 92  Resp: 20 19 18 16   Temp: 98.2 F (36.8 C) 98.3 F (36.8 C) 97.9 F (36.6 C) 98 F (36.7 C)   TempSrc: Oral Oral Oral Oral  SpO2: 100%  100% 99%  Weight:      Height:       General: alert, cooperative and no distress Lochia: appropriate Uterine Fundus: firm Incision: N/A DVT Evaluation: No evidence of DVT seen on physical exam. Labs: Lab Results  Component Value Date   WBC 6.4 10/30/2019   HGB 10.8 (L) 10/30/2019   HCT 35.7 (L) 10/30/2019   MCV 76.9 (L) 10/30/2019   PLT 326 10/30/2019   CMP Latest Ref Rng & Units 03/02/2019  Glucose 70 - 99 mg/dL 85  BUN 6 - 20 mg/dL 9  Creatinine 0.44 - 1.00 mg/dL 0.61  Sodium 135 - 145 mmol/L 137  Potassium 3.5 - 5.1 mmol/L 3.7  Chloride 98 - 111 mmol/L 104  CO2 22 - 32 mmol/L 24  Calcium 8.9 - 10.3 mg/dL 9.2  Total Protein 6.5 - 8.1 g/dL 6.8  Total Bilirubin 0.3 - 1.2 mg/dL 0.5  Alkaline Phos 38 - 126 U/L 50  AST 15 - 41 U/L 23  ALT 0 - 44 U/L 23   Edinburgh Score: Edinburgh Postnatal Depression Scale Screening Tool 11/02/2019  I have been able to laugh and see the funny side of things. 1  I have looked forward with enjoyment to things. 0  I have blamed myself unnecessarily when things went wrong. 1  I have been anxious or worried for no good reason. 2  I have felt  scared or panicky for no good reason. 2  Things have been getting on top of me. 1  I have been so unhappy that I have had difficulty sleeping. 0  I have felt sad or miserable. 1  I have been so unhappy that I have been crying. 1  The thought of harming myself has occurred to me. 0  Edinburgh Postnatal Depression Scale Total 9     After visit meds:  Allergies as of 11/02/2019      Reactions   Pork-derived Products Other (See Comments)   Life style preference      Medication List    STOP taking these medications   Accu-Chek SmartView test strip Generic drug: glucose blood   Accu-Chek Softclix Lancets lancets     TAKE these medications   Accu-Chek Nano SmartView w/Device Kit 1 kit by Subdermal route as directed. Check blood sugars for fasting,  and two hours after breakfast, lunch and dinner (4 checks daily)   acetaminophen 325 MG tablet Commonly known as: Tylenol Take 2 tablets (650 mg total) by mouth every 4 (four) hours as needed (for pain scale < 4).   ferrous sulfate 325 (65 FE) MG EC tablet Take 1 tablet (325 mg total) by mouth daily with breakfast.   ibuprofen 600 MG tablet Commonly known as: ADVIL Take 1 tablet (600 mg total) by mouth every 6 (six) hours.   norethindrone 0.35 MG tablet Commonly known as: Ortho Micronor Take 1 tablet (0.35 mg total) by mouth daily.   PRENATAL VITAMIN PO Take 1 tablet by mouth daily.        Discharge home in stable condition Infant Feeding: Breast (donor milk) Infant Disposition:home with mother Discharge instruction: per After Visit Summary and Postpartum booklet. Activity: Advance as tolerated. Pelvic rest for 6 weeks.  Diet: routine diet Future Appointments: Future Appointments  Date Time Provider [place]  12/16/2019  8:30 AM Gavin Pound, Nikolai None   Follow up Visit: Message sent by Sylvester Harder 11/02/19   Please schedule this patient for a In person postpartum visit in 4 weeks with the following provider: Any provider. Additional Postpartum F/U:2 hour GTT  High risk pregnancy complicated by: GDM Delivery mode:  Vaginal, Spontaneous  Anticipated Birth Control:  POPs   70/92/9574 Arrie Senate, MD

## 2019-10-31 NOTE — Discharge Instructions (Signed)

## 2019-10-31 NOTE — Lactation Note (Signed)
This note was copied from a baby's chart. Lactation Consultation Note  Patient Name: Kim Crosby Today's Date: 10/31/2019   Lactation visit attempted @ 8 hrs of life, but Mom was sleeping. Infant noted to be in bassinet.    Matthias Hughs [facility] 10/31/2019, 12:32 PM

## 2019-10-31 NOTE — Progress Notes (Signed)
Labor Progress Note Kim Crosby is a 19 y.o. G1P0 at [redacted]w[redacted]d presented for spontaneous onset of labor  S:  Patient very uncomfortable from rectal pressure.   O:  BP (!) 97/57 Comment: pt laying on side with shakes, Asymtomatic,   Pulse (!) 124   Temp 99.6 F (37.6 C) (Oral)   Resp 19   Ht 5\' 3"  (1.6 m)   Wt 114.8 kg   LMP 01/24/2019   SpO2 98%   BMI 44.82 kg/m   Fetal Tracing:  Baseline: 150 Variability: moderate Accels: 15x15 Decels: none  Toco: 3-4   CVE: Dilation: (P) 10 Effacement (%): 100 Cervical Position: Middle Station: Plus 1 Presentation: Vertex Exam by:: Sharolyn Douglas, CNM    A&P: 19 y.o. G1P0 [redacted]w[redacted]d spontaneous onset of labor #Labor: Progressing well. Will labor down x1 hour and reassess to start pushing. Epidural button pushed again to hopefully help with rectal pain.  #Pain: epidural #FWB: Cat 1 #GBS positive  Wende Mott, CNM 3:09 AM

## 2019-10-31 NOTE — Clinical Social Work Maternal (Addendum)
CLINICAL SOCIAL WORK MATERNAL/CHILD NOTE  Patient Details  Name: Kim Crosby MRN: [id number] Date of Birth: 11-05-2000  Date:  10/31/2019  Clinical Social Worker Initiating Note:  Darra Lis, [place] Date/Time: Initiated:  10/31/19/0130     Child's Name:  Kim Crosby   Biological Parents:  Mother, Father   Need for Interpreter:  None   Reason for Referral:  Behavioral Health Concerns   Address:  [place] [address]    Phone number:  [phone] (home)     Additional phone number:   Household Members/Support Persons (HM/SP):   Household Member/Support Person 1   HM/SP Name Relationship DOB or Age  HM/SP -1 Kim Crosby Significant Other 09/12/1997  HM/SP -2        HM/SP -3        HM/SP -4        HM/SP -5        HM/SP -6        HM/SP -7        HM/SP -8          Natural Supports (not living in the home):  Immediate Family   Professional Supports: Therapist   Employment: Part-time   Type of Work: Work Water quality scientist:  Attending college   Homebound arranged:    Museum/gallery curator Resources:  [employer], Multimedia programmer   Other Resources:  [facility]   Cultural/Religious Considerations Which May Impact Care:    Strengths:  Ability to meet basic needs , Engineer, materials, Home prepared for child , Understanding of illness   Psychotropic Medications:         Pediatrician:    [place] area  Pediatrician List:   [facility]      Pediatrician Fax Number:    Risk Factors/Current Problems:  Mental Health Concerns    Cognitive State:  Alert , Linear Thinking , Goal Oriented , Insightful    Mood/Affect:  Calm , Happy , Interested    CSW Assessment: CSW consulted for history of anxiety, depression & bipolar II. CSW met with MOB to provide support and complete assessment. CSW observed baby in bedside bassinet and  FOB bedside. CSW also observed a Facetime on MOB telephone lying on bed. CSW asked MOB if she would like to end call and speak alone for privacy. MOB asked what would be discussed, CSW stated mental health history. MOB expressed understanding and stated everyone is aware of her mental health history.   MOB stated she regularly lives in [place] at [address] in [place] with FOB. MOB stated the [facility] address on file is her mother's address, where she will be staying temporarily once discharged. 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 MOB receives [facility] and is aware of resources for food stamps. CSW asked MOB about her mental health history. MOB disclosed she does have a history of anxiety, depression and bipolar 2. MOB stated she was first diagnosed at age 48, three years ago. MOB shared she was on medication in 2020, which was not helpful. MOB stated she attends therapy at [facility] and was actively in therapy during her pregnancy. MOB expressed she stopped towards the end of her pregnancy due to having a lot going on. MOB expressed she finds therapy to be helpful. CSW asked MOB if she has access  to return to therapy and medication management if needed, MOB stated yes. CSW asked MOB if she has other coping mechanisms. MOB stated she talks it out. MOB identified FOB and her mother as supports. MOB denied any current SI or HI. CSW asked MOB how she is feeling overall, MOB stated she feels good and happy that baby is here.     CSW provided education regarding the baby blues period vs. perinatal mood disorders, discussed treatment and gave resources for mental health follow up if concerns arise.  CSW recommends self-evaluation during the postpartum time period using the New Mom Checklist from Postpartum Progress and encouraged MOB to contact a medical professional if symptoms are noted at any time.    CSW provided review of Sudden Infant Death Syndrome (SIDS) precautions.  MOB  stated baby will sleep in basinet and a pack'n play once discharged home. MOB stated she has all of the essential needs for baby to discharge home, including a brand new carseat. Baby will receive follow-[facility] at [facility]. MOB denies any transportation barriers.    CSW identifies no further need for intervention and no barriers to discharge at this time.    CSW Plan/Description:  No Further Intervention Required/No Barriers to Discharge, Sudden Infant Death Syndrome (SIDS) Education, Perinatal Mood and Anxiety Disorder (PMADs) Education    Waylan Boga, LCSWA 10/31/2019, 1:52 PM

## 2019-10-31 NOTE — Anesthesia Postprocedure Evaluation (Signed)
Anesthesia Post Note  Patient: Kim Crosby  Procedure(s) Performed: AN AD HOC LABOR EPIDURAL     Patient location during evaluation: Mother Baby Anesthesia Type: Epidural Level of consciousness: awake and alert and oriented Pain management: satisfactory to patient Vital Signs Assessment: post-procedure vital signs reviewed and stable Respiratory status: respiratory function stable Cardiovascular status: stable Postop Assessment: no headache, no backache, epidural receding, patient able to bend at knees, no signs of nausea or vomiting and adequate PO intake Anesthetic complications: no   No complications documented.  Last Vitals:  Vitals:   10/31/19 0707 10/31/19 0815  BP: 132/73 117/69  Pulse: (!) 106 87  Resp: 20 18  Temp: 37 C 36.9 C  SpO2: 100% 100%    Last Pain:  Vitals:   10/31/19 0815  TempSrc: Oral  PainSc: 0-No pain   Pain Goal: Patients Stated Pain Goal: 8 (10/30/19 2312)                 Katherina Mires

## 2019-10-31 NOTE — Lactation Note (Signed)
This note was copied from a baby's chart. Lactation Consultation Note  Patient Name: Boy Bobie Wimberley Today's Date: 10/31/2019 Reason for consult: Initial assessment;Mother's request;Difficult latch;Primapara;1st time breastfeeding;Term;Maternal endocrine disorder Type of Endocrine Disorder?: Diabetes  Infant is 56 weeks 26 hours old. Mom hx of Anemia, Depression, Bipolar and Anxiety. She has not been on medication for over a year. Mom offering infant the breast shortly after delivery for about 5 minutes then few hour later able to latch him for 14 minutes. Infant had 1 stool and no urine since birth.   LC assisted Mom with heat and breast massage followed by hand expression, no drops of colostrum noted. Infant latched in football on the left for about 4 minutes on and off the latch. We switched to the right side and infant able to latch for about 11 minutes. Infant is on and off but signs of milk transfer noted. Parents are able to look for signs of milk transfer.   [place] set up the manual pump and showed them how to clean and assemble it.   Plan 1. To feed based on cues 8-12x in 24 hour period no more than 4 hours without an attempt.           2. Reviewed the I's and O's sheet with family to monitor feeds urine and fecal output.            3. Mom will supplement any EBM with spoon            4. Mom will pump using the manual to increase stimulation q 3 hours for 10 minutes.                                                                                                                5. LC reviewed the brochure on inpatient and outpatient services.

## 2019-10-31 NOTE — Progress Notes (Signed)
CNM notified of new elevated axillary temp of 100.8. Will treat for Triple I and give tylenol PO.  Wende Mott, CNM 10/31/19 3:32 AM

## 2019-11-01 NOTE — Progress Notes (Addendum)
Post Partum Day 1 Subjective: up ad lib, voiding, tolerating PO and Some abdominal cramping. Still no BM or flatus. Concerns about latching  Objective: Blood pressure 119/77, pulse 83, temperature 98.2 F (36.8 C), temperature source Oral, resp. rate 20, height _0  (1.6 m), weight 114.8 kg, last menstrual period 01/24/2019, SpO2 100 %, unknown if currently breastfeeding.  Physical Exam:  General: alert and no distress Lochia: appropriate Uterine Fundus: firm Incision: n//a DVT Evaluation: No evidence of DVT seen on physical exam.  Recent Labs    10/30/19 0634  HGB 10.8*  HCT 35.7*    Assessment/Plan: Plan for discharge tomorrow, Breastfeeding, Lactation consult and Circumcision prior to discharge   LOS: 2 days   Baldo Ash 11/01/2019, 8:35 AM   I personally saw and evaluated the patient, performing the key elements of the service. I developed and verified the management plan that is described in the resident's/student's note, and I agree with the content with my edits above. VSS, HRR&R, Resp unlabored, Legs neg.  Nigel Berthold, CNM 11/03/2019 9:13 AM

## 2019-11-01 NOTE — Lactation Note (Signed)
This note was copied from a baby's chart. Lactation Consultation Note  Patient Name: Kim Crosby Today's Date: 11/01/2019   Infant is now 58 hrs old. At 24 hrs of life, infant had lost 7.3%. Parents had concerns about infant falling asleep at breast; MD was looking to lactation for advice on whether to go ahead and supplement. I observed a feeding. Infant latched with relative ease (using the teacup hold) with Mom in side-lying position. Using cervical auscultation, infant's swallows were noted to only occur after about every 15-19+ sucks. Overall, Mom was comfortable with latch except for initial tenderness  Consent signed for DBM. DBM was provided via 5Fr/syringe, which inititally worked well, but then it became apparent that the supplement needed to be finished with a bottle. Infant did well with the extra-slow flow nipple, although some pacing was required. Dad is very adept; he could attempt using the 5Fr/syringe again later, if decided.   MGM is on her way with a [employer] DEBP. Mom had fallen asleep by end of consult, but Dad understands Mom needs to pump whenever infant receives DBM (unless Mom is sleeping).   Infant noted to have a divet in tip of tongue with some reduced elevation noted when crying. Frenulum easily visible when crying. However, infant's tongue was noted to be over gum when at the breast & while bottle feeding. This was brought to parents' attention in case it ever needs further attention.   Matthias Hughs [facility] 11/01/2019, 3:28 PM

## 2019-11-02 LAB — GLUCOSE, CAPILLARY: Glucose-Capillary: 88 mg/dL (ref 70–99)

## 2019-11-02 MED ORDER — ACETAMINOPHEN 325 MG PO TABS: 650.0000 mg | ORAL_TABLET | ORAL | Status: DC | PRN

## 2019-11-02 MED ORDER — NORETHINDRONE 0.35 MG PO TABS: 1.0000 | ORAL_TABLET | Freq: Every day | ORAL | 11 refills | Status: DC

## 2019-11-02 MED ORDER — AMLODIPINE BESYLATE 5 MG PO TABS
5.0000 mg | ORAL_TABLET | Freq: Every day | ORAL | 0 refills | Status: DC
Start: 2019-11-02 — End: 2022-04-06

## 2019-11-02 MED ORDER — AMLODIPINE BESYLATE 5 MG PO TABS
5.0000 mg | ORAL_TABLET | Freq: Every day | ORAL | Status: DC
  Administered 2019-11-02: 5 mg via ORAL
  Filled 2019-11-02: qty 1

## 2019-11-02 MED ORDER — IBUPROFEN 600 MG PO TABS
600.0000 mg | ORAL_TABLET | Freq: Four times a day (QID) | ORAL | 0 refills | Status: DC
Start: 2019-11-02 — End: 2021-08-04

## 2019-11-02 NOTE — Lactation Note (Signed)
This note was copied from a baby's chart. Lactation Consultation Note  Patient Name: Kim Crosby Kim Crosby Date: 11/02/2019 Reason for consult: Follow-up assessment;Primapara;1st time breastfeeding;Other (Comment) (LC in treatment nursery having a circ per mom) Type of Endocrine Disorder?: Diabetes  Mom expressed she was is very tired. LC recommended a nap.  [place] provided a written LC plan with breast feeding basis steps for latching.  LC stressed the importance of baby is having a difficult time latching to  Make sure the baby is being fed at least 30 ml at a feeding and keep working on the latching and pumping to enhance her milk volume coming in.  Sore nipple and engorgement prevention and tx reviewed.  Per mom has a a DEBP and a hand pump, and has only pumped x 3 since the DEBP was set up last evening and only getting drops.  LC reassured mom pumping can be a slow process.  Since the baby has had some bottles may need to give the baby and appetizer from the bottle of EBM or formula prior to latching so the baby is calm.  LC stressed importance of the baby staying latched for at least 10 mins to be consider a feeding and average time per breast 15 -20 mins with increased swallows.  Per mom has a DEBP, also active with [place].  Mom has the [facility] pamphlet with resource phone numbers.    Maternal Data    Feeding Feeding Type:  (per mom baby was fed 25 ml of donor milk ar 0930)  LATCH Score                   Interventions Interventions: Breast feeding basics reviewed  Lactation Tools Discussed/Used Tools: Pump Flange Size: 27 Breast pump type: Double-Electric Breast Pump;Manual WIC Program: Yes Pump Review: Milk Storage   Consult Status Consult Status: Complete Date: 11/02/19    Kim Crosby 11/02/2019, 11:34 AM

## 2019-11-08 ENCOUNTER — Ambulatory Visit (INDEPENDENT_AMBULATORY_CARE_PROVIDER_SITE_OTHER): Payer: BC Managed Care – PPO | Admitting: *Deleted

## 2019-11-08 ENCOUNTER — Other Ambulatory Visit: Payer: Self-pay

## 2019-11-08 VITALS — BP 122/88 | HR 112 | Temp 98.2°F | Wt 230.0 lb

## 2019-11-08 DIAGNOSIS — O165 Unspecified maternal hypertension, complicating the puerperium: Secondary | ICD-10-CM

## 2019-11-08 DIAGNOSIS — Z0130 Encounter for examination of blood pressure without abnormal findings: Secondary | ICD-10-CM

## 2019-11-08 NOTE — Progress Notes (Signed)
   Subjective:  Kim Crosby is a 19 y.o. female here for BP check.   Hypertension ROS: taking medications as instructed, no medication side effects noted, no TIA's, no chest pain on exertion, no dyspnea on exertion, no swelling of ankles, no orthostatic dizziness or lightheadedness, no orthopnea or paroxysmal nocturnal dyspnea and no palpitations.    Objective:  BP 122/88 (BP Location: Left Arm, Patient Position: Sitting, Cuff Size: Large)   Pulse (!) 112   Temp 98.2 F (36.8 C) (Oral)   Wt 230 lb (104.3 kg)   LMP 01/24/2019   Breastfeeding Yes   BMI 40.74 kg/m   Appearance alert, well appearing, and in no distress and oriented to person, place, and time. General exam BP noted to be well controlled today in office.    Assessment:   Blood Pressure stable and asymptomatic.   Plan:  Current treatment plan is effective, no change in therapy.   Derl Barrow, RN

## 2019-12-05 ENCOUNTER — Telehealth: Payer: Self-pay | Admitting: *Deleted

## 2019-12-05 NOTE — Telephone Encounter (Signed)
Patient called stating she is having tingling sensation in her hands. Patient reported that she goggle the symptom and think she might have diabetic neuropathy. Advised patient to go to urgent care or PCP. She will need to have her postpartum 2 hr gtt completed before she can be diagnosed with diabetes.   Derl Barrow, RN

## 2019-12-16 ENCOUNTER — Ambulatory Visit: Payer: BC Managed Care – PPO

## 2019-12-29 ENCOUNTER — Ambulatory Visit: Payer: BC Managed Care – PPO | Admitting: Obstetrics and Gynecology

## 2020-01-05 ENCOUNTER — Ambulatory Visit: Payer: BC Managed Care – PPO | Admitting: Obstetrics and Gynecology

## 2020-01-18 ENCOUNTER — Ambulatory Visit (INDEPENDENT_AMBULATORY_CARE_PROVIDER_SITE_OTHER): Payer: BC Managed Care – PPO | Admitting: Advanced Practice Midwife

## 2020-01-18 ENCOUNTER — Encounter: Payer: Self-pay | Admitting: Advanced Practice Midwife

## 2020-01-18 ENCOUNTER — Other Ambulatory Visit (HOSPITAL_COMMUNITY)
Admission: RE | Admit: 2020-01-18 | Discharge: 2020-01-18 | Disposition: A | Discharge status: Home / Self Care | Payer: BC Managed Care – PPO | Source: Ambulatory Visit | Attending: Advanced Practice Midwife | Admitting: Advanced Practice Midwife

## 2020-01-18 ENCOUNTER — Other Ambulatory Visit: Payer: Self-pay

## 2020-01-18 VITALS — BP 116/80 | HR 103 | Wt 220.8 lb

## 2020-01-18 DIAGNOSIS — A630 Anogenital (venereal) warts: Secondary | ICD-10-CM | POA: Insufficient documentation

## 2020-01-18 DIAGNOSIS — O9833 Other infections with a predominantly sexual mode of transmission complicating the puerperium: Secondary | ICD-10-CM | POA: Diagnosis not present

## 2020-01-18 DIAGNOSIS — R87610 Atypical squamous cells of undetermined significance on cytologic smear of cervix (ASC-US): Secondary | ICD-10-CM | POA: Insufficient documentation

## 2020-01-18 DIAGNOSIS — Z30430 Encounter for insertion of intrauterine contraceptive device: Secondary | ICD-10-CM

## 2020-01-18 DIAGNOSIS — O99893 Other specified diseases and conditions complicating puerperium: Secondary | ICD-10-CM | POA: Diagnosis not present

## 2020-01-18 DIAGNOSIS — Z01812 Encounter for preprocedural laboratory examination: Secondary | ICD-10-CM | POA: Diagnosis not present

## 2020-01-18 DIAGNOSIS — O24430 Gestational diabetes mellitus in the puerperium, diet controlled: Secondary | ICD-10-CM

## 2020-01-18 DIAGNOSIS — O24410 Gestational diabetes mellitus in pregnancy, diet controlled: Secondary | ICD-10-CM

## 2020-01-18 LAB — POCT URINE PREGNANCY: Preg Test, Ur: NEGATIVE

## 2020-01-18 MED ORDER — PARAGARD INTRAUTERINE COPPER IU IUD: INTRAUTERINE_SYSTEM | Freq: Once | INTRAUTERINE | Status: AC

## 2020-01-18 NOTE — Progress Notes (Signed)
Post Partum Visit Note  Kim Crosby is a 20 y.o. G53P1001 female who presents for a postpartum visit. She is 11 weeks postpartum following a normal spontaneous vaginal delivery.  I have fully reviewed the prenatal and intrapartum course. The delivery was at [redacted]w[redacted]d gestational weeks.  Anesthesia: epidural. Postpartum course has been uncomplicated. Baby is doing well. Baby is feeding by bottle - Happy, Healthy baby. Bleeding no bleeding. Bowel function is normal. Bladder function is normal. Patient is sexually active. Contraception method is none. Postpartum depression screening: positive. (13)   The pregnancy intention screening data noted above was reviewed. Potential methods of contraception were discussed. The patient elected to proceed with IUD or IUS.    Edinburgh Postnatal Depression Scale - 01/18/20 0913      Edinburgh Postnatal Depression Scale:  In the Past 7 Days   I have been able to laugh and see the funny side of things. 1    I have looked forward with enjoyment to things. 1    I have blamed myself unnecessarily when things went wrong. 2    I have been anxious or worried for no good reason. 2    I have felt scared or panicky for no good reason. 1    Things have been getting on top of me. 2    I have been so unhappy that I have had difficulty sleeping. 2    I have felt sad or miserable. 1    I have been so unhappy that I have been crying. 1    The thought of harming myself has occurred to me. 0    Edinburgh Postnatal Depression Scale Total 13           The following portions of the patient's history were reviewed and updated as appropriate: allergies, current medications, past family history, past medical history, past social history, past surgical history and problem list.  Review of Systems Pertinent items are noted in HPI.    Objective:  BP 116/80   Pulse (!) 103   Wt 220 lb 12.8 oz (100.2 kg)   LMP 01/24/2019   BMI 39.11 kg/m    Physical Exam Vitals and  nursing note reviewed. Exam conducted with a chaperone present.  Constitutional:      General: She is not in acute distress. HENT:     Head: Normocephalic.  Cardiovascular:     Rate and Rhythm: Normal rate.  Pulmonary:     Effort: Pulmonary effort is normal.  Abdominal:     Palpations: Abdomen is soft.     Tenderness: There is no abdominal tenderness.  Genitourinary:    Comments:  External: no lesion Vagina: small amount of white discharge Cervix: pink, smooth, no CMT Uterus: NSSC Adnexa: NT  Skin:    General: Skin is warm and dry.  Neurological:     Mental Status: She is alert and oriented to person, place, and time.  Psychiatric:        Mood and Affect: Mood normal.        Behavior: Behavior normal.      Results for orders placed or performed in visit on 01/18/20 (from the past 24 hour(s))  POCT urine pregnancy     Status: Normal   Collection Time: 01/18/20  9:22 AM  Result Value Ref Range   Preg Test, Ur Negative Negative    GYNECOLOGY OFFICE PROCEDURE NOTE  Kim Crosby is a 20 y.o. G1P1001 here for Paragard IUD insertion.  IUD Insertion Procedure Note Patient identified, informed consent performed, consent signed.   Discussed risks of irregular bleeding, cramping, infection, malpositioning or misplacement of the IUD outside the uterus which may require further procedure such as laparoscopy. Time out was performed.  Urine pregnancy test negative.  Speculum placed in the vagina.  Cervix visualized.  Cleaned with Betadine x 2.  Grasped anteriorly with a single tooth tenaculum.  Uterus sounded to 7 cm.  Paragard IUD placed per manufacturer's recommendations.  Strings trimmed to 3 cm. Tenaculum was removed, good hemostasis noted.  Patient tolerated procedure well.   Patient was given post-procedure instructions.  She was advised to have backup contraception for one week.  Patient was also asked to check IUD strings periodically and follow up in 4 weeks for IUD  check.   Assessment:   1. Diet controlled gestational diabetes mellitus (GDM) in third trimester   2. Postpartum care and examination   3. Encounter for IUD insertion    Pap smear today  FU with Kim Crosby if desired due to elevated EDPS. Patient states that it is elevated because this has just been a tough week. She doesn't think she will need it, but is glad to know Kim Crosby is available and will reach out if needed.    Plan:   Essential components of care per ACOG recommendations:  1.  Mood and well being: Patient with positive depression screening today. Reviewed local resources for support.  - Patient does not use tobacco. NA If using tobacco we discussed reduction and for recently cessation risk of relapse - hx of drug use? No  NA  If yes, discussed support systems  2. Infant care and feeding:  -Patient currently breastmilk feeding? No NA If breastmilk feeding discussed return to work and pumping. If needed, patient was provided letter for work to allow for every 2-3 hr pumping breaks, and to be granted a private location to express breastmilk and refrigerated area to store breastmilk. Reviewed importance of draining breast regularly to support lactation. -Social determinants of health (SDOH) reviewed in EPIC. No concerns  3. Sexuality, contraception and birth spacing - Patient does not want a pregnancy in the next year.  Desired family size is 4 children.  - Reviewed forms of contraception in tiered fashion. Patient desired Paragrad IUD  today.   - Discussed birth spacing of 18 months  4. Sleep and fatigue -Encouraged family/partner/community support of 4 hrs of uninterrupted sleep to help with mood and fatigue  5. Physical Recovery  - Discussed patients delivery and complications - Patient had a 1st degree laceration, perineal healing reviewed. Patient expressed understanding - Patient has urinary incontinence? No NA  Patient was referred to pelvic floor PT  - Patient is safe  to resume physical and sexual activity  6.  Health Maintenance - Last pap smear done NA,age. Pap done today    Kim Buffy DNP, CNM  01/18/20  9:53 AM

## 2020-01-18 NOTE — Patient Instructions (Signed)

## 2020-01-19 ENCOUNTER — Ambulatory Visit (INDEPENDENT_AMBULATORY_CARE_PROVIDER_SITE_OTHER): Payer: BC Managed Care – PPO | Admitting: Licensed Clinical Social Worker

## 2020-01-19 DIAGNOSIS — Z658 Other specified problems related to psychosocial circumstances: Secondary | ICD-10-CM

## 2020-01-19 LAB — GLUCOSE TOLERANCE, 2 HOURS
Glucose, 2 hour: 121 mg/dL (ref 65–139)
Glucose, GTT - Fasting: 96 mg/dL (ref 65–99)

## 2020-01-19 NOTE — BH Specialist Note (Signed)
Integrated Behavioral Health Initial In-Person Visit  MRN: [id number] Name: Kim Crosby  Number of Integrated Behavioral Health Clinician visits:: 1/6 Session Start time: 1:15  Session End time: 1:30pm Total time: 15 minutes via mychart  Types of Service: General Behavioral Integrated Care (BHI)  Interpretor:No. Interpretor Name and Language: none   Warm Hand Off Completed.       Subjective: Kim Crosby is a 20 y.o. female accompanied by n/a Patient was referred by Zorita Pang CNM for mood check. Patient reports the following symptoms/concerns: increased stress  Duration of problem: approx two weeks ; Severity of problem: mild  Objective: Mood: Euthymic and Affect: Appropriate Risk of harm to self or others: No plan to harm self or others  Life Context: Family and Social: Lives in [place] with father of baby  School/Work: Holiday representative at [employer]  Self-Care: Rest and date nights  Life Changes: adjusting to motherhood   Patient and/or Family's Strengths/Protective Factors: Concrete supports in place (healthy food, safe environments, etc.)  Goals Addressed: Patient will: 1. Reduce symptoms of: stress 2. Increase knowledge and/or ability of: healthy habits  3. Demonstrate ability to: Increase healthy adjustment to current life circumstances  Progress towards Goals: Ongoing  Interventions: Interventions utilized: Supportive Counseling  Standardized Assessments completed: Edinburgh Postnatal Depression   Assessment: Patient currently experiencing pyschosocial stressors    Patient may benefit from outpatient therapy   Plan: 1. Follow up with behavioral health clinician on : as needed  2. Behavioral recommendations: Utilize planner to schedule important task, encourage time for self care, prioritize rest and delegate task to prevent burnout 3. Referral(s): n/a 4. "From scale of 1-10, how likely are you to follow plan?":  Gwyndolyn Saxon,  LCSW

## 2020-01-25 LAB — CYTOLOGY - PAP
Chlamydia: NEGATIVE
Comment: NEGATIVE
Comment: NEGATIVE
Comment: NEGATIVE
Comment: NORMAL
Diagnosis: UNDETERMINED — AB
HPV 16: NEGATIVE
HPV 18 / 45: NEGATIVE
High risk HPV: POSITIVE — AB
Neisseria Gonorrhea: NEGATIVE

## 2020-01-26 ENCOUNTER — Telehealth: Payer: Self-pay

## 2020-01-27 NOTE — Telephone Encounter (Signed)
Patient has called requesting to speak with provider about lab results.  Derl Barrow, RN

## 2020-02-03 NOTE — Telephone Encounter (Addendum)
-----   Message from Tresea Mall, CNM sent at 02/03/2020  7:59 AM EST ----- Abnormal pap with + HPV. She needs a repeat pap in one year. Due to her age she will likely clear the virus on her own, but we should get another pap in one year to see if that is the case.   Patient called requesting to speak with someone regarding her PAP result. Patient verified DOB. 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 Patient to have repeat PAP in one year. Advised that at her age, HPV can clear itself from the body. Explained what PAP results meant. Patient reported she did have the HPV vaccines as a teen. Patient is unsure of partner. Patient was very worried that her baby will have problems and stated "AM I going to die?" Reassured patient that baby will be fine and she will not die.   Derl Barrow, RN

## 2020-02-10 ENCOUNTER — Other Ambulatory Visit: Payer: Self-pay

## 2020-02-10 ENCOUNTER — Ambulatory Visit (INDEPENDENT_AMBULATORY_CARE_PROVIDER_SITE_OTHER): Payer: BC Managed Care – PPO

## 2020-02-10 VITALS — BP 126/83 | HR 87 | Temp 98.0°F | Wt 227.0 lb

## 2020-02-10 DIAGNOSIS — Z30431 Encounter for routine checking of intrauterine contraceptive device: Secondary | ICD-10-CM

## 2020-02-10 DIAGNOSIS — Z7251 High risk heterosexual behavior: Secondary | ICD-10-CM | POA: Diagnosis not present

## 2020-02-10 DIAGNOSIS — Z30432 Encounter for removal of intrauterine contraceptive device: Secondary | ICD-10-CM | POA: Diagnosis not present

## 2020-02-10 DIAGNOSIS — T8332XA Displacement of intrauterine contraceptive device, initial encounter: Secondary | ICD-10-CM | POA: Diagnosis not present

## 2020-02-10 DIAGNOSIS — Z3202 Encounter for pregnancy test, result negative: Secondary | ICD-10-CM | POA: Diagnosis not present

## 2020-02-10 LAB — POCT URINE PREGNANCY: Preg Test, Ur: NEGATIVE

## 2020-02-10 MED ORDER — LEVONORGESTREL 1.5 MG PO TABS: 1.5000 mg | ORAL_TABLET | Freq: Once | ORAL | 0 refills | Status: AC

## 2020-02-10 MED ORDER — NORELGESTROMIN-ETH ESTRADIOL 150-35 MCG/24HR TD PTWK: 1.0000 | MEDICATED_PATCH | TRANSDERMAL | 6 refills | Status: DC

## 2020-02-10 NOTE — Progress Notes (Signed)
    GYNECOLOGY OFFICE ENCOUNTER NOTE  History:  Kim Crosby is a 20 y.o. G1P1001 here today for today for IUD string check; Paragard  IUD was placed  Jan 18, 2020. No complaints about the IUD, no concerning side effects. Patient recent sexual activity was yesterday.   The following portions of the patient's history were reviewed and updated as appropriate: allergies, current medications, past family history, past medical history, past social history, past surgical history and problem list. Last pap smear on Jan 18, 2020 was normal, Positive HRHPV.  Review of Systems:  Pertinent items are noted in HPI.   Objective:  Physical Exam Blood pressure 126/83, pulse 87, temperature 98 F (36.7 C), temperature source Oral, weight 227 lb (103 kg), currently breastfeeding. CONSTITUTIONAL: Well-developed, well-nourished female in no acute distress.  HENT:  Normocephalic, atraumatic. External right and left ear normal. Oropharynx is clear and moist EYES: Conjunctivae and EOM are normal. Pupils are equal, round, and reactive to light. No scleral icterus.  NECK: Normal range of motion, supple, no masses CARDIOVASCULAR: Normal heart rate noted RESPIRATORY: Effort and breath sounds normal, no problems with respiration noted ABDOMEN: Soft, no distention noted.   PELVIC: Normal appearing external genitalia; normal appearing vaginal mucosa and cervix.  IUD strings not visualized, but posterior aspect of device noted protruding from external os. After discussion, patient agreeable with removal.  Time out performed at 1135am and IUD grasped by posterior aspect, with ring forceps, and removed intact.  Strings noted after removal.   Assessment & Plan:  IUD Migration/Malpresentation Recent UPS  -Informed of IUD findings and recommendation for removal. -Patient expresses extensive concern with recent UPS and potential pregnancy in setting of misplaced IUD. -Discussed plan to give back up birth control method  today.  -Offered placement of another Paragard IUD which can also act as an emergency contraception. -Patient requests time to decide and then requests paragard IUD. -However, due to closing of office as well as no availability of paragard IUD patient was informed of need to reschedule. -Patient requests pregnancy test and is agreeable to Plan B pill. -Patient also requests patch for usage during interim. -Pregnancy test returns negative. -RTO in next available appt for IUD placement. -Patient expresses disappointment with having to drive to [place] as she lives in [place].   -Given information for [place] clinic and encouraged to make appt there for IUD placement. -Patient verbalized understanding without further questions.  Maryann Conners MSN, CNM Advanced Practice Provider, [facility]

## 2020-02-10 NOTE — Patient Instructions (Addendum)
Levonorgestrel emergency contraceptive kit What is this medicine? LEVONORGESTREL (LEE voe nor JES trel) is an emergency contraceptive. It prevents pregnancy if taken within 72 hours after your regular birth control fails or you have unprotected sex. This medicine will not work if you are already pregnant. This medicine may be used for other purposes; ask your health care provider or pharmacist if you have questions. COMMON BRAND NAME(S): AfterPill, EContra EZ, EContra One-Step, Fallback Solo, My Choice, My Way, Next Choice, Next Choice One Dose, Opcicon One-Step, Plan B, Plan B One-Step, Preventeza, React, Take Action What should I tell my health care provider before I take this medicine? They need to know if you have or ever had any of these conditions:  an unusual or allergic reaction to levonorgestrel, other medicines, foods, dyes, or preservatives  pregnant or trying to get pregnant  breast-feeding How should I use this medicine? Take this medicine by mouth. Your doctor may want you to use a quick-response pregnancy test prior to using the tablets. Take your medicine as soon as you can after having unprotected sex, preferably in the first 24 hours, but no later than 72 hours (3 days) after the event. Follow the dose instructions of your health care provider exactly. Do not take any extra pills. Extra pills will not decrease your risk of pregnancy, but may increase your risk of side effects. A patient package insert for the product will be given with each prescription and refill. Read this sheet carefully each time. The sheet may change frequently. Contact your pediatrician regarding the use of this medicine in children. Special care may be needed. This medicine has been used in female children who have started having menstrual periods. Overdosage: If you think you have taken too much of this medicine contact a poison control center or emergency room at once. NOTE: This medicine is only for you.  Do not share this medicine with others. What if I miss a dose? This medicine is not for regular use. Take exactly as directed. If you vomit within 2 hours of taking your dose, contact your health care professional for instructions. What may interact with this medicine?  aprepitant  armodafinil  barbiturates such as phenobarbital or primidone  bexarotene  bosentan  carbamazepine  certain medicines for HIV or AIDS or hepatitis  felbamate  griseofulvin  modafinil  oxcarbazepine  phenytoin  rifabutin  rifampin  rifapentine  St. John's wort  topiramate This list may not describe all possible interactions. Give your health care provider a list of all the medicines, herbs, non-prescription drugs, or dietary supplements you use. Also tell them if you smoke, drink alcohol, or use illegal drugs. Some items may interact with your medicine. What should I watch for while using this medicine? Your period may begin a few days earlier or later than expected. If your period is more than 7 days late, pregnancy is possible. See your health care provider as soon as you can and get a pregnancy test. Talk to your healthcare provider before taking this medicine if you know or suspect that you are pregnant. Contact your healthcare provider if you think you may be pregnant and you have taken this medicine. If you have severe abdominal pain, you may have a pregnancy outside the womb, which is called an ectopic or tubal pregnancy. Call your health care provider or go to the nearest emergency room right away if you think this is happening. Discuss birth control options with your health care provider. Emergency birth control  is not to be used routinely to prevent pregnancy. Be sure to use your regular birth control method right away, or start one, if you do not have a regular birth control method already. This medicine does not protect you against HIV infection (AIDS) or any other sexually transmitted  diseases (STDs). What side effects may I notice from receiving this medicine? Side effects that you should report to your doctor or health care professional as soon as possible:  allergic reactions like skin rash, itching or hives, swelling of the face, lips, or tongue Side effects that usually do not require medical attention (report to your doctor or health care professional if they continue or are bothersome):  abdominal pain or cramping  breast tenderness  dizziness  headache  nausea  spotting  tiredness This list may not describe all possible side effects. Call your doctor for medical advice about side effects. You may report side effects to FDA at 1-800-FDA-1088. Where should I keep my medicine? Keep out of the reach of children. Store at room temperature between 15 and 30 degrees C (59 and 86 degrees F). Throw away any unused medicine after the expiration date. NOTE: This sheet is a summary. It may not cover all possible information. If you have questions about this medicine, talk to your doctor, pharmacist, or health care provider.  2021 Elsevier/Gold Standard (2016-05-16 14:30:49) Levonorgestrel; Ethinyl Estradiol Skin Patch What is this medicine? LEVONORGESTREL; ETHINYL ESTRADIOL (LEE voh nor jes trel; ETH in il es tra DYE ole) skin patch is used as a contraceptive (birth control method). This patch combines 2 types of female hormones, an estrogen and a progestin. It prevents ovulation and pregnancy. This medicine may be used for other purposes; ask your health care provider or pharmacist if you have questions. COMMON BRAND NAME(S): Molli Posey What should I tell my health care provider before I take this medicine? They need to know if you have any of these conditions:  abnormal vaginal bleeding  blood vessel disease or blood clots  breast, cervical, endometrial, ovarian, liver, or uterine cancer  diabetes  gallbladder disease  having surgery  heart disease or  recent heart attack  high blood pressure  high cholesterol or triglycerides  history of irregular heartbeat or heart valve problems  kidney disease  liver disease  migraine headaches  protein C deficiency  protein S deficiency  recently had a baby, miscarriage, or abortion  stroke  systemic lupus erythematosus (SLE)  tobacco smoker  an unusual or allergic reaction to estrogens, progestins, other medicines, foods, dyes, or preservatives  pregnant or trying to get pregnant  breast-feeding How should I use this medicine? This patch is applied to the skin. Follow the directions on the prescription label. Apply to clean, dry, healthy skin on the buttock, abdomen, upper outer arm or upper torso, in a place where it will not be rubbed by tight clothing. Do not use lotions or other cosmetics on the site where the patch will go. Press the patch firmly in place for 10 seconds to ensure good contact with the skin. Change the patch every 7 days on the same day of the week for 3 weeks. You will then have a break from the patch for 1 week, after which you will apply a new patch. Do not use your medicine more often than directed. Contact your pediatrician regarding the use of this medicine in children. Special care may be needed. This medicine has been used in female children who have started having menstrual  periods. A patient package insert for the product will be given with each prescription and refill. Read this sheet carefully each time. The sheet may change frequently. Overdosage: If you think you have taken too much of this medicine contact a poison control center or emergency room at once. NOTE: This medicine is only for you. Do not share this medicine with others. What if I miss a dose? You will need to replace your patch once a week as directed. If your patch is lost or falls off, contact your health care professional for advice. You may need to use another form of birth control if  your patch has been off for more than 1 day. What may interact with this medicine? Do not take this medicine with the following medications:  dasabuvir; ombitasvir; paritaprevir; ritonavir  ombitasvir; paritaprevir; ritonavir This medicine may also interact with the following medications:  acetaminophen  antibiotics or medicines for infections, especially rifampin, rifabutin, rifapentine, and possibly penicillins or tetracyclines  aprepitant or fosaprepitant  armodafinil  ascorbic acid (vitamin C)  barbiturate medicines, such as phenobarbital or primidone  bosentan  certain antiviral medicines for hepatitis, HIV or AIDS  certain medicines for cancer treatment  certain medicines for seizures like carbamazepine, clobazam, felbamate, lamotrigine, oxcarbazepine, phenytoin, rufinamide, topiramate  certain medicines for treating high cholesterol  cyclosporine  dantrolene  elagolix  flibanserin  grapefruit juice  lesinurad  medicines for diabetes  medicines to treat fungal infections, such as griseofulvin, miconazole, fluconazole, ketoconazole, itraconazole, posaconazole or voriconazole  mifepristone  mitotane  modafinil  morphine  mycophenolate  St. John's wort  tamoxifen  temazepam  theophylline or aminophylline  thyroid hormones  tizanidine  tranexamic acid  ulipristal  warfarin This list may not describe all possible interactions. Give your health care provider a list of all the medicines, herbs, non-prescription drugs, or dietary supplements you use. Also tell them if you smoke, drink alcohol, or use illegal drugs. Some items may interact with your medicine. What should I watch for while using this medicine? Visit your doctor or health care professional for regular checks on your progress. You will need a regular breast and pelvic exam and Pap smear while on this medicine. If you have any reason to think you are pregnant, stop using this  medicine right away and contact your doctor or health care professional. If you are using this medicine for hormone-related problems, it may take several cycles of use to see improvement in your condition. Smoking increases the risk of getting a blood clot or having a stroke while you are using hormonal birth control, especially if you are more than 22 years old. You are strongly advised not to smoke. This medicine can make your body retain fluid, making your fingers, hands, or ankles swell. Your blood pressure can go up. Contact your doctor or health care professional if you feel you are retaining fluid. This medicine can make you more sensitive to the sun. Keep out of the sun. If you cannot avoid being in the sun, wear protective clothing and use sunscreen. Do not use sun lamps or tanning beds/booths. If you wear contact lenses and notice visual changes, or if the lenses begin to feel uncomfortable, consult your eye care specialist. In some women, tenderness, swelling, or minor bleeding of the gums may occur. Notify your dentist if this happens. Brushing and flossing your teeth regularly may help limit this. See your dentist regularly and inform your dentist of the medicines you are taking. If you are going  to have elective surgery or an MRI, you may need to stop using this medicine before the surgery or MRI. Consult your health care professional for advice. This medicine does not protect you against HIV infection (AIDS) or any other sexually transmitted diseases. What side effects may I notice from receiving this medicine? Side effects that you should report to your doctor or health care professional as soon as possible:  allergic reactions such as skin rash or itching, hives, swelling of the lips, mouth, tongue, or throat  breast tissue changes or discharge  dark patches of skin on your forehead, cheeks, upper lip, and chin  depression  high blood pressure  migraines or severe, sudden  headaches  missed menstrual periods  signs and symptoms of a blood clot such as breathing problems; changes in vision; chest pain; severe, sudden headache; pain, swelling, warmth in the leg; trouble speaking; sudden numbness or weakness of the face, arm or leg  stomach pain  symptoms of vaginal infection like itching, irritation or unusual discharge  yellowing of the eyes or skin Side effects that usually do not require medical attention (report these to your doctor or health care professional if they continue or are bothersome):  acne  breast pain, tenderness  irregular vaginal bleeding or spotting, particularly during the first month of use  mild headache  nausea  painful menstrual periods  skin redness or mild irritation at site where applied  weight gain (slight) This list may not describe all possible side effects. Call your doctor for medical advice about side effects. You may report side effects to FDA at 1-800-FDA-1088. Where should I keep my medicine? Keep out of the reach of children. Store at room temperature between 15 and 30 degrees C (59 and 86 degrees F). Keep the patch in its pouch until time of use. Throw away any unused medicine after the expiration date. Dispose of used patches properly. Since a used patch may still contain active hormones, fold the patch in half so that it sticks to itself prior to disposal. Throw away in a place where children or pets cannot reach. NOTE: This sheet is a summary. It may not cover all possible information. If you have questions about this medicine, talk to your doctor, pharmacist, or health care provider.  2021 Elsevier/Gold Standard (2018-11-23 15:59:32)

## 2020-02-23 ENCOUNTER — Ambulatory Visit: Payer: BC Managed Care – PPO | Admitting: Obstetrics and Gynecology

## 2020-03-26 DIAGNOSIS — Z113 Encounter for screening for infections with a predominantly sexual mode of transmission: Secondary | ICD-10-CM | POA: Diagnosis not present

## 2020-03-26 DIAGNOSIS — Z1388 Encounter for screening for disorder due to exposure to contaminants: Secondary | ICD-10-CM | POA: Diagnosis not present

## 2020-03-26 DIAGNOSIS — Z0389 Encounter for observation for other suspected diseases and conditions ruled out: Secondary | ICD-10-CM | POA: Diagnosis not present

## 2020-03-26 DIAGNOSIS — Z114 Encounter for screening for human immunodeficiency virus [HIV]: Secondary | ICD-10-CM | POA: Diagnosis not present

## 2020-03-26 DIAGNOSIS — Z3009 Encounter for other general counseling and advice on contraception: Secondary | ICD-10-CM | POA: Diagnosis not present

## 2020-04-05 NOTE — Telephone Encounter (Signed)
Review completed

## 2020-05-08 DIAGNOSIS — Z3202 Encounter for pregnancy test, result negative: Secondary | ICD-10-CM | POA: Diagnosis not present

## 2020-05-08 DIAGNOSIS — Z23 Encounter for immunization: Secondary | ICD-10-CM | POA: Diagnosis not present

## 2020-05-08 DIAGNOSIS — Z30431 Encounter for routine checking of intrauterine contraceptive device: Secondary | ICD-10-CM | POA: Diagnosis not present

## 2020-12-13 DIAGNOSIS — Z419 Encounter for procedure for purposes other than remedying health state, unspecified: Secondary | ICD-10-CM | POA: Diagnosis not present

## 2021-01-13 DIAGNOSIS — Z419 Encounter for procedure for purposes other than remedying health state, unspecified: Secondary | ICD-10-CM | POA: Diagnosis not present

## 2021-02-13 DIAGNOSIS — Z419 Encounter for procedure for purposes other than remedying health state, unspecified: Secondary | ICD-10-CM | POA: Diagnosis not present

## 2021-03-13 DIAGNOSIS — Z419 Encounter for procedure for purposes other than remedying health state, unspecified: Secondary | ICD-10-CM | POA: Diagnosis not present

## 2021-03-20 ENCOUNTER — Encounter: Payer: Self-pay | Admitting: *Deleted

## 2021-03-21 ENCOUNTER — Ambulatory Visit: Payer: Self-pay | Admitting: Family Medicine

## 2021-04-05 ENCOUNTER — Ambulatory Visit: Payer: Self-pay | Admitting: Physician Assistant

## 2021-04-11 ENCOUNTER — Ambulatory Visit
Admission: EM | Admit: 2021-04-11 | Discharge: 2021-04-11 | Disposition: A | Discharge status: Home / Self Care | Payer: 59 | Attending: Internal Medicine | Admitting: Internal Medicine

## 2021-04-11 ENCOUNTER — Other Ambulatory Visit: Payer: Self-pay

## 2021-04-11 ENCOUNTER — Encounter: Payer: Self-pay | Admitting: Emergency Medicine

## 2021-04-11 DIAGNOSIS — H109 Unspecified conjunctivitis: Secondary | ICD-10-CM | POA: Diagnosis not present

## 2021-04-11 MED ORDER — OFLOXACIN 0.3 % OP SOLN: OPHTHALMIC | 0 refills | Status: AC

## 2021-04-11 NOTE — ED Triage Notes (Signed)
Left eye redness for 2 days.  Denies runny nose, cough ?

## 2021-04-11 NOTE — ED Provider Notes (Signed)
?[facility] ? ? ? ?CSN: [id number] ?Arrival date & time: 04/11/21  1250 ? ? ?  ? ?History   ?Chief Complaint ?Chief Complaint  ?Patient presents with  ? Eye Problem  ? ? ?HPI ?Kim Crosby is a 21 y.o. female.  ? ?Patient presents with left eye redness and irritation that has been present for approximately 2 days.  Her child has similar symptoms currently.  Patient denies any associated upper respiratory symptoms or fever.  Denies any blurry vision.  Patient does wear contacts.  Denies trauma, foreign body, drainage from the eye. ? ? ?Eye Problem ? ?Past Medical History:  ?Diagnosis Date  ? Anemia   ? Anxiety   ? Bipolar 2 disorder ([place])   ? Depression   ? Gestational diabetes   ? Medical history non-contributory   ? Retinitis   ? ? ?Patient Active Problem List  ? Diagnosis Date Noted  ? Excess weight gain in pregnancy, second trimester 07/27/2019  ? ? ?Past Surgical History:  ?Procedure Laterality Date  ? REFRACTIVE SURGERY Bilateral   ? ? ?OB History   ? ? Gravida  ?1  ? Para  ?1  ? Term  ?1  ? Preterm  ?   ? AB  ?   ? Living  ?1  ?  ? ? SAB  ?   ? IAB  ?   ? Ectopic  ?   ? Multiple  ?0  ? Live Births  ?1  ?   ?  ?  ? ? ? ?Home Medications   ? ?Prior to Admission medications   ?Medication Sig Start Date End Date Taking? Authorizing Provider  ?ofloxacin (OCUFLOX) 0.3 % ophthalmic solution Place 1 drop into the left eye every 4 (four) hours for 2 days, THEN 1 drop 4 (four) times daily for 5 days. 04/11/21 04/18/21 Yes Teodora Medici, FNP  ?acetaminophen (TYLENOL) 325 MG tablet Take 2 tablets (650 mg total) by mouth every 4 (four) hours as needed (for pain scale < 4). ?Patient not taking: Reported on 11/08/2019 17/79/39   Arrie Senate, MD  ?amLODipine (NORVASC) 5 MG tablet Take 1 tablet (5 mg total) by mouth daily. ?Patient not taking: Reported on 01/18/2020 03/00/92   Arrie Senate, MD  ?Blood Glucose Monitoring Suppl (ACCU-CHEK NANO SMARTVIEW) w/Device KIT 1 kit by Subdermal route as directed.  Check blood sugars for fasting, and two hours after breakfast, lunch and dinner (4 checks daily) ?Patient not taking: Reported on 11/08/2019 09/09/19   Gavin Pound, CNM  ?ferrous sulfate 325 (65 FE) MG EC tablet Take 1 tablet (325 mg total) by mouth daily with breakfast. ?Patient not taking: Reported on 11/08/2019 08/13/19   Gavin Pound, CNM  ?ibuprofen (ADVIL) 600 MG tablet Take 1 tablet (600 mg total) by mouth every 6 (six) hours. ?Patient not taking: Reported on 11/08/2019 33/00/76   Arrie Senate, MD  ?norelgestromin-ethinyl estradiol (ORTHO EVRA) 150-35 MCG/24HR transdermal patch Place 1 patch onto the skin once a week. 02/10/20   Gavin Pound, CNM  ?norethindrone (ORTHO MICRONOR) 0.35 MG tablet Take 1 tablet (0.35 mg total) by mouth daily. ?Patient not taking: Reported on 01/18/2020 11/02/19 22/63/33  Arrie Senate, MD  ?Prenatal Vit-Fe Fumarate-FA (PRENATAL VITAMIN PO) Take 1 tablet by mouth daily.  ?Patient not taking: Reported on 11/08/2019    [provider]  ? ? ?Family History ?Family History  ?Problem Relation Age of Onset  ? Gestational diabetes Mother   ?  At younger age  ? Asthma Father   ? Diabetes Maternal Grandmother   ? Kidney disease Maternal Grandmother   ? Gestational diabetes Maternal Grandmother   ?     When younger  ? ? ?Social History ?Social History  ? ?Tobacco Use  ? Smoking status: Never  ? Smokeless tobacco: Never  ?Vaping Use  ? Vaping Use: Never used  ?Substance Use Topics  ? Alcohol use: No  ? Drug use: Not Currently  ?  Types: Marijuana  ? ? ? ?Allergies   ?Pork-derived products ? ? ?Review of Systems ?Review of Systems ?Per HPI ? ?Physical Exam ?Triage Vital Signs ?ED Triage Vitals  ?Enc Vitals Group  ?   BP 04/11/21 1323 134/72  ?   Pulse Rate 04/11/21 1323 91  ?   Resp 04/11/21 1323 20  ?   Temp 04/11/21 1323 98.3 ?F (36.8 ?C)  ?   Temp Source 04/11/21 1323 Oral  ?   SpO2 04/11/21 1323 98 %  ?   Weight --   ?   Height --   ?   Head Circumference --   ?    Peak Flow --   ?   Pain Score 04/11/21 1327 2  ?   Pain Loc --   ?   Pain Edu? --   ?   Excl. in [place]? --   ? ?No data found. ? ?Updated Vital Signs ?BP 134/72 (BP Location: Left Arm)   Pulse 91   Temp 98.3 ?F (36.8 ?C) (Oral)   Resp 20   LMP 04/10/2021   SpO2 98%  ? ?Visual Acuity ?Right Eye Distance: 20 40 ?Left Eye Distance: 0 (Patient did not have contact lense in left eye, unable to see anything.  Provider advised.) ?Bilateral Distance: 20 70 ? ?Right Eye Near:   ?Left Eye Near:    ?Bilateral Near:    ? ?Physical Exam ?Constitutional:   ?   General: She is not in acute distress. ?   Appearance: Normal appearance. She is not toxic-appearing or diaphoretic.  ?HENT:  ?   Head: Normocephalic and atraumatic.  ?Eyes:  ?   General: Lids are normal. Lids are everted, no foreign bodies appreciated. Vision grossly intact. Gaze aligned appropriately.  ?   Extraocular Movements: Extraocular movements intact.  ?   Conjunctiva/sclera:  ?   Right eye: Right conjunctiva is not injected. No chemosis, exudate or hemorrhage. ?   Left eye: Left conjunctiva is injected. No chemosis, exudate or hemorrhage. ?   Pupils: Pupils are equal, round, and reactive to light.  ?   Comments: No foreign bodies noted.   ?Pulmonary:  ?   Effort: Pulmonary effort is normal.  ?Neurological:  ?   General: No focal deficit present.  ?   Mental Status: She is alert and oriented to person, place, and time. Mental status is at baseline.  ?Psychiatric:     ?   Mood and Affect: Mood normal.     ?   Behavior: Behavior normal.     ?   Thought Content: Thought content normal.     ?   Judgment: Judgment normal.  ? ? ? ?UC Treatments / Results  ?Labs ?(all labs ordered are listed, but only abnormal results are displayed) ?Labs Reviewed - No data to display ? ?EKG ? ? ?Radiology ?No results found. ? ?Procedures ?Procedures (including critical care time) ? ?Medications Ordered in UC ?Medications - No data to display ? ?Initial Impression /  Assessment and Plan  / UC Course  ?I have reviewed the triage vital signs and the nursing notes. ? ?Pertinent labs & imaging results that were available during my care of the patient were reviewed by me and considered in my medical decision making (see chart for details). ? ?  ? ?Exam is consistent with bacterial conjunctivitis especially due to exposure.  Will treat with ofloxacin given that patient wears contact lenses.  Patient does not have contact lens at this time.  Advised to keep contact lenses out.  Visual acuity unchanged as patient reports that she has bad vision at baseline without contacts.  Patient to follow-up with eye doctor if symptoms persist or worsen.  Discussed return precautions.  Patient verbalized understanding and was agreeable with plan. ?Final Clinical Impressions(s) / UC Diagnoses  ? ?Final diagnoses:  ?Bacterial conjunctivitis of left eye  ? ? ? ?Discharge Instructions   ? ?  ?You have pinkeye which is being treated with antibiotic drops.  Please keep contacts out.  Follow-up with eye doctor if symptoms persist or worsen. ? ? ? ?ED Prescriptions   ? ? Medication Sig Dispense Auth. Provider  ? ofloxacin (OCUFLOX) 0.3 % ophthalmic solution Place 1 drop into the left eye every 4 (four) hours for 2 days, THEN 1 drop 4 (four) times daily for 5 days. 5 mL Teodora Medici, [place]  ? ?  ? ?PDMP not reviewed this encounter. ?  ?Teodora Medici, [place] ?04/11/21 1353 ? ?

## 2021-04-11 NOTE — Discharge Instructions (Signed)
You have pinkeye which is being treated with antibiotic drops.  Please keep contacts out.  Follow-up with eye doctor if symptoms persist or worsen. ?

## 2021-04-13 ENCOUNTER — Emergency Department (HOSPITAL_COMMUNITY)
Admission: EM | Admit: 2021-04-13 | Discharge: 2021-04-14 | Disposition: A | Discharge status: Home / Self Care | Payer: 59 | Attending: Emergency Medicine | Admitting: Emergency Medicine

## 2021-04-13 ENCOUNTER — Other Ambulatory Visit: Payer: Self-pay

## 2021-04-13 ENCOUNTER — Encounter (HOSPITAL_COMMUNITY): Payer: Self-pay | Admitting: Emergency Medicine

## 2021-04-13 DIAGNOSIS — Z975 Presence of (intrauterine) contraceptive device: Secondary | ICD-10-CM | POA: Diagnosis not present

## 2021-04-13 DIAGNOSIS — N939 Abnormal uterine and vaginal bleeding, unspecified: Secondary | ICD-10-CM | POA: Diagnosis not present

## 2021-04-13 DIAGNOSIS — Z419 Encounter for procedure for purposes other than remedying health state, unspecified: Secondary | ICD-10-CM | POA: Diagnosis not present

## 2021-04-13 NOTE — ED Triage Notes (Signed)
Pt reports she has had heavy menstrual bleeding X2 days.   ?

## 2021-04-14 ENCOUNTER — Emergency Department (HOSPITAL_COMMUNITY): Payer: 59

## 2021-04-14 DIAGNOSIS — N939 Abnormal uterine and vaginal bleeding, unspecified: Secondary | ICD-10-CM | POA: Diagnosis not present

## 2021-04-14 LAB — WET PREP, GENITAL
Clue Cells Wet Prep HPF POC: NONE SEEN
Sperm: NONE SEEN
Trich, Wet Prep: NONE SEEN
WBC, Wet Prep HPF POC: <10 (ref <10)
Yeast Wet Prep HPF POC: NONE SEEN

## 2021-04-14 LAB — I-STAT CHEM 8, ED
BUN: 10 mg/dL (ref 6–20)
Calcium, Ion: 1.18 mmol/L (ref 1.15–1.40)
Chloride: 102 mmol/L (ref 98–111)
Creatinine, Ser: 0.7 mg/dL (ref 0.44–1.00)
Glucose, Bld: 87 mg/dL (ref 70–99)
HCT: 39 % (ref 36.0–46.0)
Hemoglobin: 13.3 g/dL (ref 12.0–15.0)
Potassium: 3.7 mmol/L (ref 3.5–5.1)
Sodium: 139 mmol/L (ref 135–145)
TCO2: 26 mmol/L (ref 22–32)

## 2021-04-14 LAB — HCG, QUANTITATIVE, PREGNANCY: hCG, Beta Chain, Quant, S: 19 m[IU]/mL — ABNORMAL HIGH (ref <5)

## 2021-04-14 LAB — I-STAT BETA HCG BLOOD, ED (MC, WL, AP ONLY): I-stat hCG, quantitative: 21.7 m[IU]/mL — ABNORMAL HIGH (ref <5)

## 2021-04-14 NOTE — Discharge Instructions (Addendum)
Your pregnancy hormone today was 19 today.  Please follow up with your OB on Monday for recheck.  Get rechecked if you have worsening bleeding or new concerning symptoms.   ?

## 2021-04-14 NOTE — ED Provider Notes (Signed)
?[place] ?Provider Note ? ? ?CSN: [id number] ?Arrival date & time: 04/13/21  2230 ? ?  ? ?History ? ?Chief Complaint  ?Patient presents with  ? Vaginal Bleeding  ? ? ?Kim Crosby is a 21 y.o. female. ? ?The history is provided by the patient and medical records.  ?Vaginal Bleeding ?Kim Crosby is a 21 y.o. female who presents to the Emergency Department complaining of vaginal bleeding.  She has a ParaGard IUD in place that was placed 1 year ago.  She started her menstrual cycle 3 days ago and this morning the bleeding was significantly heavier than usual.  Prior to ED arrival she had saturated both the pad and a tampon within 2-1/2 hours.  Overall since ED arrival she states that the bleeding has begun to decrease.  No history of bleeding disorder.  She is sexually active. ? ? ?No systemic symptoms such as lightheadedness, chest pain, difficulty breathing. ?  ? ?Home Medications ?Prior to Admission medications   ?Medication Sig Start Date End Date Taking? Authorizing Provider  ?acetaminophen (TYLENOL) 325 MG tablet Take 2 tablets (650 mg total) by mouth every 4 (four) hours as needed (for pain scale < 4). ?Patient not taking: Reported on 11/08/2019 44/31/54   Arrie Senate, MD  ?amLODipine (NORVASC) 5 MG tablet Take 1 tablet (5 mg total) by mouth daily. ?Patient not taking: Reported on 01/18/2020 00/86/76   Arrie Senate, MD  ?Blood Glucose Monitoring Suppl (ACCU-CHEK NANO SMARTVIEW) w/Device KIT 1 kit by Subdermal route as directed. Check blood sugars for fasting, and two hours after breakfast, lunch and dinner (4 checks daily) ?Patient not taking: Reported on 11/08/2019 09/09/19   Gavin Pound, CNM  ?ferrous sulfate 325 (65 FE) MG EC tablet Take 1 tablet (325 mg total) by mouth daily with breakfast. ?Patient not taking: Reported on 11/08/2019 08/13/19   Gavin Pound, CNM  ?ibuprofen (ADVIL) 600 MG tablet Take 1 tablet (600 mg total) by mouth every 6 (six)  hours. ?Patient not taking: Reported on 11/08/2019 19/50/93   Arrie Senate, MD  ?norelgestromin-ethinyl estradiol (ORTHO EVRA) 150-35 MCG/24HR transdermal patch Place 1 patch onto the skin once a week. 02/10/20   Gavin Pound, CNM  ?norethindrone (ORTHO MICRONOR) 0.35 MG tablet Take 1 tablet (0.35 mg total) by mouth daily. ?Patient not taking: Reported on 01/18/2020 11/02/19 26/71/24  Arrie Senate, MD  ?ofloxacin (OCUFLOX) 0.3 % ophthalmic solution Place 1 drop into the left eye every 4 (four) hours for 2 days, THEN 1 drop 4 (four) times daily for 5 days. 04/11/21 04/18/21  Teodora Medici, FNP  ?   ? ?Allergies    ?Pork-derived products   ? ?Review of Systems   ?Review of Systems  ?Genitourinary:  Positive for vaginal bleeding.  ?All other systems reviewed and are negative. ? ?Physical Exam ?Updated Vital Signs ?BP 110/67   Pulse 60   Resp 18   Ht 5' 3.5" (1.613 m)   Wt 98.4 kg   LMP 04/10/2021   SpO2 100%   BMI 37.84 kg/m?  ?Physical Exam ?Vitals and nursing note reviewed.  ?Constitutional:   ?   Appearance: She is well-developed.  ?HENT:  ?   Head: Normocephalic and atraumatic.  ?Cardiovascular:  ?   Rate and Rhythm: Normal rate and regular rhythm.  ?Pulmonary:  ?   Effort: Pulmonary effort is normal. No respiratory distress.  ?Abdominal:  ?   Palpations: Abdomen is soft.  ?  Tenderness: There is no abdominal tenderness. There is no guarding or rebound.  ?Genitourinary: ?   Comments: IUD string in place.  Moderate blood in vaginal vault, able to clear without re-pooling.  No clots. ?Musculoskeletal:     ?   General: No tenderness.  ?Skin: ?   General: Skin is warm and dry.  ?Neurological:  ?   Mental Status: She is alert and oriented to person, place, and time.  ?Psychiatric:     ?   Behavior: Behavior normal.  ? ? ?ED Results / Procedures / Treatments   ?Labs ?(all labs ordered are listed, but only abnormal results are displayed) ?Labs Reviewed  ?HCG, QUANTITATIVE, PREGNANCY - Abnormal; Notable  for the following components:  ?    Result Value  ? hCG, Beta Chain, Quant, S 19 (*)   ? All other components within normal limits  ?I-STAT BETA HCG BLOOD, ED ([facility], [facility], [facility] ONLY) - Abnormal; Notable for the following components:  ? I-stat hCG, quantitative 21.7 (*)   ? All other components within normal limits  ?WET PREP, GENITAL  ?I-STAT CHEM 8, ED  ?GC/CHLAMYDIA PROBE AMP ([place]) NOT AT [facility]  ? ? ?EKG ?None ? ?Radiology ?US OB LESS THAN 14 WEEKS WITH OB TRANSVAGINAL ? ?Result Date: 04/14/2021 ?CLINICAL DATA:  Vaginal bleeding for 2 days. Quantitative beta HCG is 19. LMP is not provided. EXAM: OBSTETRIC <14 WK [place] AND TRANSVAGINAL OB US TECHNIQUE: Both transabdominal and transvaginal ultrasound examinations were performed for complete evaluation of the gestation as well as the maternal uterus, adnexal regions, and pelvic cul-de-sac. Transvaginal technique was performed to assess early pregnancy. COMPARISON:  None. FINDINGS: Intrauterine gestational sac: None Yolk sac:  Not Visualized. Embryo:  Not Visualized. Cardiac Activity: Not Visualized. Maternal uterus/adnexae: The uterus is anteverted. No myometrial mass lesions are identified. Endometrial stripe thickness is normal, measuring 4 mm. A linear echogenic structure is demonstrated in the endometrium consistent with an intrauterine device. The intrauterine device appears to be appropriately position. Cervix is unremarkable. Both ovaries are visualized. Right ovary measures 3.1 x 1.9 x 1.9 cm. Left ovary measures 4.2 x 1.7 x 1.2 cm. The ovaries appear normal. No abnormal adnexal masses. No free fluid. IMPRESSION: 1. No intrauterine gestational sac, yolk sac, or fetal pole identified. Differential considerations include intrauterine pregnancy too early to be sonographically visualized, missed abortion, or ectopic pregnancy. Followup ultrasound is recommended in 10-14 days for further evaluation. 2. An intrauterine device is present with appropriate positioning. 3.  No abnormal adnexal masses or pelvic fluid are seen. Electronically Signed   By: Lucienne Capers M.D.   On: 04/14/2021 03:42   ? ?Procedures ?Procedures  ? ? ?Medications Ordered in ED ?Medications - No data to display ? ?ED Course/ Medical Decision Making/ A&P ?  ?                        ?Medical Decision Making ?Amount and/or Complexity of Data Reviewed ?Labs: ordered. ?Radiology: ordered. ? ? ?Patient with IUD here for evaluation of heavy vaginal bleeding.  She does have moderate bleeding on examination with no evidence of hemorrhage.  Her quant is minimally elevated at 19.  Hemoglobin is within normal limits.  Unclear if this is early pregnancy with miscarriage versus spurious lab result.  Records reviewed in epic, prior blood type is B+, no need for RhoGAM.  Counseled patient on current unclear clinical picture.  She does have OB follow-up on Monday.  Discussed  importance of OB follow-up as well as return precautions ? ? ? ? ? ? ?Final Clinical Impression(s) / ED Diagnoses ?Final diagnoses:  ?Vaginal bleeding  ? ? ?Rx / DC Orders ?ED Discharge Orders   ? ? None  ? ?  ? ? ?  ?Quintella Reichert, MD ?04/14/21 [phone] ? ?

## 2021-04-15 LAB — GC/CHLAMYDIA PROBE AMP (~~LOC~~) NOT AT ARMC
Chlamydia: NEGATIVE
Comment: NEGATIVE
Comment: NORMAL
Neisseria Gonorrhea: NEGATIVE

## 2021-05-13 DIAGNOSIS — Z419 Encounter for procedure for purposes other than remedying health state, unspecified: Secondary | ICD-10-CM | POA: Diagnosis not present

## 2021-06-13 DIAGNOSIS — Z419 Encounter for procedure for purposes other than remedying health state, unspecified: Secondary | ICD-10-CM | POA: Diagnosis not present

## 2021-07-13 DIAGNOSIS — Z419 Encounter for procedure for purposes other than remedying health state, unspecified: Secondary | ICD-10-CM | POA: Diagnosis not present

## 2021-07-26 ENCOUNTER — Encounter: Payer: Self-pay | Admitting: *Deleted

## 2021-07-26 NOTE — Congregational Nurse Program (Signed)
  Dept: [phone]   Congregational Nurse Program Note  Date of Encounter: 07/26/2021  Past Medical History: Past Medical History:  Diagnosis Date   Anemia    Anxiety    Bipolar 2 disorder ([place])    Depression    Gestational diabetes    Medical history non-contributory    Retinitis     Encounter Details:  CNP Questionnaire - 07/26/21 1522       Questionnaire   Do you give verbal consent to treat you today? Yes    Location Patient Kim Crosby or Organization;Phone/Text/Email    Patient Status Homeless   staying at [facility] or [place] Referral N/A    Medication N/A    Medical Provider Yes    Screening Referrals N/A    Medical Referral [place] N/A    Transportation Need transportation assistance;Provided transportation assistance    Housing/Utilities No permanent housing    Interpersonal Safety N/A    Intervention Support;Advocate;Navigate Healthcare System;Counsel    ED Visit Averted N/A    Life-Saving Intervention Made N/A            Client seen at [facility] requesting help with mental health services. Client was unsure if she still has [facility] with her father's insurance. Assisted client with number to call for verification. Made an appt for therapy with [facility] July 20th 2:30. They will pick up client at her GM's house and return her there after therapy. Client reported she needed help with glasses also. Client will see writer at [facility] on Monday or Wednesday or see CN nurse at [facility] for vision screening referral. Ashle Stief W RN CN Najee Manninen W RN CN

## 2021-07-29 DIAGNOSIS — F3132 Bipolar disorder, current episode depressed, moderate: Secondary | ICD-10-CM | POA: Diagnosis not present

## 2021-08-03 ENCOUNTER — Other Ambulatory Visit: Payer: Self-pay

## 2021-08-03 ENCOUNTER — Encounter (HOSPITAL_COMMUNITY): Payer: Self-pay | Admitting: Obstetrics and Gynecology

## 2021-08-03 ENCOUNTER — Emergency Department (HOSPITAL_COMMUNITY)
Admission: EM | Admit: 2021-08-03 | Discharge: 2021-08-03 | Disposition: A | Discharge status: Other Acute Inpatient Hospital | Payer: 59 | Source: Home / Self Care

## 2021-08-03 ENCOUNTER — Inpatient Hospital Stay (HOSPITAL_COMMUNITY)
Admission: AD | Admit: 2021-08-03 | Discharge: 2021-08-03 | Discharge status: Against Medical Advice | Payer: 59 | Attending: Obstetrics and Gynecology | Admitting: Obstetrics and Gynecology

## 2021-08-03 DIAGNOSIS — O2631 Retained intrauterine contraceptive device in pregnancy, first trimester: Secondary | ICD-10-CM

## 2021-08-03 DIAGNOSIS — O4690 Antepartum hemorrhage, unspecified, unspecified trimester: Secondary | ICD-10-CM | POA: Insufficient documentation

## 2021-08-03 DIAGNOSIS — Z3A00 Weeks of gestation of pregnancy not specified: Secondary | ICD-10-CM | POA: Insufficient documentation

## 2021-08-03 DIAGNOSIS — R109 Unspecified abdominal pain: Secondary | ICD-10-CM | POA: Insufficient documentation

## 2021-08-03 DIAGNOSIS — Z5329 Procedure and treatment not carried out because of patient's decision for other reasons: Secondary | ICD-10-CM | POA: Insufficient documentation

## 2021-08-03 DIAGNOSIS — O3680X0 Pregnancy with inconclusive fetal viability, not applicable or unspecified: Secondary | ICD-10-CM | POA: Insufficient documentation

## 2021-08-03 DIAGNOSIS — O26899 Other specified pregnancy related conditions, unspecified trimester: Secondary | ICD-10-CM | POA: Diagnosis present

## 2021-08-03 LAB — POCT PREGNANCY, URINE: Preg Test, Ur: POSITIVE — AB

## 2021-08-03 LAB — URINALYSIS, ROUTINE W REFLEX MICROSCOPIC
Bacteria, UA: NONE SEEN
Bilirubin Urine: NEGATIVE
Glucose, UA: NEGATIVE mg/dL
Ketones, ur: NEGATIVE mg/dL
Leukocytes,Ua: NEGATIVE
Nitrite: NEGATIVE
Protein, ur: NEGATIVE mg/dL
Specific Gravity, Urine: 1.017 (ref 1.005–1.030)
pH: 7 (ref 5.0–8.0)

## 2021-08-03 LAB — CBC
HCT: 34.2 % — ABNORMAL LOW (ref 36.0–46.0)
Hemoglobin: 11.2 g/dL — ABNORMAL LOW (ref 12.0–15.0)
MCH: 25.5 pg — ABNORMAL LOW (ref 26.0–34.0)
MCHC: 32.7 g/dL (ref 30.0–36.0)
MCV: 77.7 fL — ABNORMAL LOW (ref 80.0–100.0)
Platelets: 420 10*3/uL — ABNORMAL HIGH (ref 150–400)
RBC: 4.4 MIL/uL (ref 3.87–5.11)
RDW: 17 % — ABNORMAL HIGH (ref 11.5–15.5)
WBC: 7.1 10*3/uL (ref 4.0–10.5)
nRBC: 0 % (ref 0.0–0.2)

## 2021-08-03 LAB — HCG, QUANTITATIVE, PREGNANCY: hCG, Beta Chain, Quant, S: 21121 m[IU]/mL — ABNORMAL HIGH (ref <5)

## 2021-08-03 NOTE — MAU Provider Note (Signed)
History     CSN: [id number]  Arrival date and time: 08/03/21 1030  Chief Complaint  Patient presents with   Abdominal Pain   Vaginal Bleeding   HPI  Kim Crosby is a 21 y.o. G1P1001 at Unknown gestational age who presents for evaluation of vaginal bleeding. Patient reports she has been spotting on and off for 2 weeks. She reports she has a Paraguard in place and had 2 positive UPTs this week. She is concerned about the bleeding with the positive pregnancy tests. She denies any pain.  She denies any discharge and leaking of fluid. Denies any constipation, diarrhea or any urinary complaints.  OB History     Gravida  2   Para  1   Term  1   Preterm      AB      Living  1      SAB      IAB      Ectopic      Multiple  0   Live Births  1           Past Medical History:  Diagnosis Date   Anemia    Anxiety    Bipolar 2 disorder ([place])    Depression    Gestational diabetes    Medical history non-contributory    Retinitis     Past Surgical History:  Procedure Laterality Date   REFRACTIVE SURGERY Bilateral     Family History  Problem Relation Age of Onset   Gestational diabetes Mother        At younger age   Asthma Father    Diabetes Maternal Grandmother    Kidney disease Maternal Grandmother    Gestational diabetes Maternal Grandmother        When younger    Social History   Tobacco Use   Smoking status: Never   Smokeless tobacco: Never  Vaping Use   Vaping Use: Never used  Substance Use Topics   Alcohol use: No   Drug use: Not Currently    Types: Marijuana    Allergies:  Allergies  Allergen Reactions   Pork-Derived Products Other (See Comments)    Life style preference Other reaction(s): Other (See Comments) Life style preference    No medications prior to admission.    Review of Systems  Constitutional: Negative.  Negative for fatigue and fever.  HENT: Negative.    Respiratory: Negative.  Negative for shortness of breath.    Cardiovascular: Negative.  Negative for chest pain.  Gastrointestinal: Negative.  Negative for abdominal pain, constipation, diarrhea, nausea and vomiting.  Genitourinary:  Positive for vaginal bleeding. Negative for dysuria and vaginal discharge.  Neurological: Negative.  Negative for dizziness and headaches.   Physical Exam   Blood pressure 124/70, pulse 90, temperature 98.1 F (36.7 C), temperature source Oral, resp. rate 16, height 5' 3.5" (1.613 m), weight 94.5 kg, last menstrual period 04/10/2021, SpO2 98 %, currently breastfeeding.  Patient Vitals for the past 24 hrs:  BP Temp Temp src Pulse Resp SpO2 Height Weight  08/03/21 1043 124/70 98.1 F (36.7 C) Oral 90 16 98 % -- --  08/03/21 1040 -- -- -- -- -- -- 5' 3.5" (1.613 m) 94.5 kg    Physical Exam Vitals and nursing note reviewed.  Constitutional:      General: She is not in acute distress.    Appearance: She is well-developed.  HENT:     Head: Normocephalic.  Eyes:     Pupils: Pupils are  equal, round, and reactive to light.  Cardiovascular:     Rate and Rhythm: Normal rate and regular rhythm.     Heart sounds: Normal heart sounds.  Pulmonary:     Effort: Pulmonary effort is normal. No respiratory distress.     Breath sounds: Normal breath sounds.  Abdominal:     General: Bowel sounds are normal. There is no distension.     Palpations: Abdomen is soft.     Tenderness: There is no abdominal tenderness.  Skin:    General: Skin is warm and dry.  Neurological:     Mental Status: She is alert and oriented to person, place, and time.  Psychiatric:        Mood and Affect: Mood normal.        Behavior: Behavior normal.        Thought Content: Thought content normal.        Judgment: Judgment normal.     MAU Course  Procedures  Results for orders placed or performed during the hospital encounter of 08/03/21 (from the past 24 hour(s))  Pregnancy, urine POC     Status: Abnormal   Collection Time: 08/03/21 10:50  AM  Result Value Ref Range   Preg Test, Ur POSITIVE (A) NEGATIVE  Urinalysis, Routine w reflex microscopic     Status: Abnormal   Collection Time: 08/03/21 11:14 AM  Result Value Ref Range   Color, Urine YELLOW YELLOW   APPearance HAZY (A) CLEAR   Specific Gravity, Urine 1.017 1.005 - 1.030   pH 7.0 5.0 - 8.0   Glucose, UA NEGATIVE NEGATIVE mg/dL   Hgb urine dipstick SMALL (A) NEGATIVE   Bilirubin Urine NEGATIVE NEGATIVE   Ketones, ur NEGATIVE NEGATIVE mg/dL   Protein, ur NEGATIVE NEGATIVE mg/dL   Nitrite NEGATIVE NEGATIVE   Leukocytes,Ua NEGATIVE NEGATIVE   RBC / HPF 0-5 0 - 5 RBC/hpf   WBC, UA 0-5 0 - 5 WBC/hpf   Bacteria, UA NONE SEEN NONE SEEN   Squamous Epithelial / LPF 0-5 0 - 5   Mucus PRESENT   CBC     Status: Abnormal   Collection Time: 08/03/21 11:17 AM  Result Value Ref Range   WBC 7.1 4.0 - 10.5 K/uL   RBC 4.40 3.87 - 5.11 MIL/uL   Hemoglobin 11.2 (L) 12.0 - 15.0 g/dL   HCT 34.2 (L) 36.0 - 46.0 %   MCV 77.7 (L) 80.0 - 100.0 fL   MCH 25.5 (L) 26.0 - 34.0 pg   MCHC 32.7 30.0 - 36.0 g/dL   RDW 17.0 (H) 11.5 - 15.5 %   Platelets 420 (H) 150 - 400 K/uL   nRBC 0.0 0.0 - 0.2 %  hCG, quantitative, pregnancy     Status: Abnormal   Collection Time: 08/03/21 11:17 AM  Result Value Ref Range   hCG, Beta Chain, Quant, S 21,121 (H) <5 mIU/mL    MDM Labs ordered and reviewed.   UA, UPT CBC, HCG ABO/Rh- B Pos Wet prep and gc/chlamydia US OB Comp Less 14 weeks with Transvaginal  Patient presented with small child. CNM and RN encouraged patient to get childcare so that exam could be completed in total. Patient attempted to get childcare for 2 hours without success.   Risks of leaving without ultrasound reviewed at length including risk of occult ectopic that could be ruptured and lead to maternal/fetal mortality. Patient verbalized understanding and is unable to find childcare. Patient signed out AMA  Assessment and Plan   1.  Pregnancy of unknown anatomic location    2. Pregnancy with IUD in place, antepartum, first trimester   3. Left against medical advice    -Left AMA before completion of exam  Wende Mott, CNM 08/03/2021, 4:11 PM

## 2021-08-03 NOTE — MAU Note (Signed)
Kim Crosby is a 21 y.o. here in MAU reporting: has a paraguard. This AM had 2 + UPT. States a couple months ago she got pregnant with the IUD and "naturally passed the baby". Started bleeding on Sunday and bleeding is much lighter then a normal period. Having some intermittent cramping.  LMP: unknown, sometime in June   Onset of complaint: ongoing  Pain score: 0/10  Vitals:   08/03/21 1043  BP: 124/70  Pulse: 90  Resp: 16  Temp: 98.1 F (36.7 C)  SpO2: 98%     Lab orders placed from triage: upt

## 2021-08-03 NOTE — MAU Note (Signed)
Pt unable to find childcare and therefore has to leave and cannot proceed with the rest of the workup. Encouraged pt to return once she has childcare if she is able to or call the office on Monday morning. Pt verbalizes understanding. AMA form signed and CNM made aware.

## 2021-08-04 ENCOUNTER — Other Ambulatory Visit: Payer: Self-pay

## 2021-08-04 ENCOUNTER — Inpatient Hospital Stay (HOSPITAL_COMMUNITY): Payer: 59

## 2021-08-04 ENCOUNTER — Inpatient Hospital Stay (HOSPITAL_COMMUNITY)
Admission: AD | Admit: 2021-08-04 | Discharge: 2021-08-04 | Disposition: A | Discharge status: Home / Self Care | Payer: 59 | Attending: Obstetrics & Gynecology | Admitting: Obstetrics & Gynecology

## 2021-08-04 DIAGNOSIS — Z5329 Procedure and treatment not carried out because of patient's decision for other reasons: Secondary | ICD-10-CM | POA: Diagnosis not present

## 2021-08-04 DIAGNOSIS — Z3491 Encounter for supervision of normal pregnancy, unspecified, first trimester: Secondary | ICD-10-CM

## 2021-08-04 DIAGNOSIS — Z3A01 Less than 8 weeks gestation of pregnancy: Secondary | ICD-10-CM | POA: Insufficient documentation

## 2021-08-04 DIAGNOSIS — O209 Hemorrhage in early pregnancy, unspecified: Secondary | ICD-10-CM | POA: Insufficient documentation

## 2021-08-04 DIAGNOSIS — Z975 Presence of (intrauterine) contraceptive device: Secondary | ICD-10-CM | POA: Insufficient documentation

## 2021-08-04 DIAGNOSIS — O2631 Retained intrauterine contraceptive device in pregnancy, first trimester: Secondary | ICD-10-CM | POA: Diagnosis not present

## 2021-08-04 DIAGNOSIS — O3680X0 Pregnancy with inconclusive fetal viability, not applicable or unspecified: Secondary | ICD-10-CM | POA: Diagnosis not present

## 2021-08-04 NOTE — Discharge Instructions (Signed)

## 2021-08-04 NOTE — MAU Note (Signed)
Kim Crosby is a 21 y.o. here in MAU reporting: ongoing bleeding. States it is still dark and saw something goopy this morning. Still having some intermittent cramping.  Onset of complaint: ongoing  Pain score: 0/10  Vitals:   08/04/21 1438  BP: 115/66  Pulse: 96  Resp: 16  Temp: 98.5 F (36.9 C)  SpO2: 97%     Lab orders placed from triage: none

## 2021-08-04 NOTE — MAU Provider Note (Signed)
History     CSN: [id number]  Arrival date and time: 08/04/21 1419   Event Date/Time   First Provider Initiated Contact with Patient 08/04/21 1544      Chief Complaint  Patient presents with   Vaginal Bleeding   HPI  Kim Crosby is a 21 y.o. G2P1001 at approximately 6 weeks who presents for evaluation of vaginal bleeding. Patient was seen here yesterday for the same complaint and had labs done but could not stay for her ultrasound because she had her child with her. She reports she is continuing to have some bright red blood on the tissue when she wipes. She denies any pain.   OB History     Gravida  2   Para  1   Term  1   Preterm      AB      Living  1      SAB      IAB      Ectopic      Multiple  0   Live Births  1           Past Medical History:  Diagnosis Date   Anemia    Anxiety    Bipolar 2 disorder ([place])    Depression    Gestational diabetes    Medical history non-contributory    Retinitis     Past Surgical History:  Procedure Laterality Date   REFRACTIVE SURGERY Bilateral     Family History  Problem Relation Age of Onset   Gestational diabetes Mother        At younger age   Asthma Father    Diabetes Maternal Grandmother    Kidney disease Maternal Grandmother    Gestational diabetes Maternal Grandmother        When younger    Social History   Tobacco Use   Smoking status: Never   Smokeless tobacco: Never  Vaping Use   Vaping Use: Never used  Substance Use Topics   Alcohol use: No   Drug use: Not Currently    Types: Marijuana    Allergies:  Allergies  Allergen Reactions   Pork-Derived Products Other (See Comments)    Life style preference Other reaction(s): Other (See Comments) Life style preference    No medications prior to admission.    Review of Systems  Constitutional: Negative.  Negative for fatigue and fever.  HENT: Negative.    Respiratory: Negative.  Negative for shortness of breath.    Cardiovascular: Negative.  Negative for chest pain.  Gastrointestinal: Negative.  Negative for abdominal pain, constipation, diarrhea, nausea and vomiting.  Genitourinary:  Positive for vaginal bleeding. Negative for dysuria.  Neurological: Negative.  Negative for dizziness and headaches.   Physical Exam   Blood pressure 115/66, pulse 96, temperature 98.5 F (36.9 C), temperature source Oral, resp. rate 16, height 5' 3.5" (1.613 m), weight 95.8 kg, last menstrual period 04/10/2021, SpO2 97 %, currently breastfeeding.  Patient Vitals for the past 24 hrs:  BP Temp Temp src Pulse Resp SpO2 Height Weight  08/04/21 1438 115/66 98.5 F (36.9 C) Oral 96 16 97 % -- --  08/04/21 1435 -- -- -- -- -- -- 5' 3.5" (1.613 m) 95.8 kg    Physical Exam Vitals and nursing note reviewed.  Constitutional:      General: She is not in acute distress.    Appearance: She is well-developed.  HENT:     Head: Normocephalic.  Eyes:     Pupils: Pupils  are equal, round, and reactive to light.  Cardiovascular:     Rate and Rhythm: Normal rate and regular rhythm.     Heart sounds: Normal heart sounds.  Pulmonary:     Effort: Pulmonary effort is normal. No respiratory distress.     Breath sounds: Normal breath sounds.  Abdominal:     General: Bowel sounds are normal. There is no distension.     Palpations: Abdomen is soft.     Tenderness: There is no abdominal tenderness.  Skin:    General: Skin is warm and dry.  Neurological:     Mental Status: She is alert and oriented to person, place, and time.  Psychiatric:        Mood and Affect: Mood normal.        Behavior: Behavior normal.        Thought Content: Thought content normal.        Judgment: Judgment normal.    MAU Course  Procedures  US OB LESS THAN 14 WEEKS WITH OB TRANSVAGINAL  Result Date: 08/04/2021 CLINICAL DATA:  Vaginal bleeding and cramping. LMP unknown. IUD in place. EXAM: OBSTETRIC <14 WK [place] AND TRANSVAGINAL OB US TECHNIQUE: Both  transabdominal and transvaginal ultrasound examinations were performed for complete evaluation of the gestation as well as the maternal uterus, adnexal regions, and pelvic cul-de-sac. Transvaginal technique was performed to assess early pregnancy. COMPARISON:  None Available. FINDINGS: Intrauterine gestational sac: Single visualized. Yolk sac:  Visualized. Embryo:  Visualized. Cardiac Activity: Visualized. Heart Rate: 119 bpm CRL: 5.2 mm   6 w   1 d                  [place] EDC: 03/29/2022 Subchorionic hemorrhage:  None visualized. Maternal uterus/adnexae: IUD visualized over the endometrium adjacent the gestational sac in somewhat oblique position. Ovaries are normal size, shape and position. No free fluid. IMPRESSION: Single live IUP with estimated gestational age [redacted] weeks 1 day. IUD in oblique position over the endometrium adjacent to the gestational sac. Electronically Signed   By: Marin Olp M.D.   On: 08/04/2021 15:40     MDM Labs ordered and reviewed.   US OB Transvaginal  CNM independently reviewed the imaging ordered. Imaging show live IUP with IUD in place  CNM attempted to remove IUD per patient request and strings were not visible at introitus.   Assessment and Plan   1. Normal intrauterine pregnancy on prenatal ultrasound in first trimester   2. [redacted] weeks gestation of pregnancy   3. IUD (intrauterine device) in place     -Discharge home in stable condition -First trimester precautions discussed -Patient advised to follow-up with OB to discuss removal of IUD in office  -Patient may return to MAU as needed or if her condition were to change or worsen  Wende Mott, CNM 08/04/2021, 5:17 PM

## 2021-08-13 DIAGNOSIS — Z419 Encounter for procedure for purposes other than remedying health state, unspecified: Secondary | ICD-10-CM | POA: Diagnosis not present

## 2021-08-18 NOTE — Congregational Nurse Program (Signed)
Met with client in the sunroom at [place]. Nurse did not see client on last week to inquire about Behavioral Health appointment.  Client reports that she did see Jan and had an appointment at [facility] but missed it. States that she rescheduled and missed it again.  Client reports that she is pregnant even though she has an IUD and that this is her second pregnancy with an IUD. Client reports that no one will remove the IUD even though she plans to terminate the pregnancy. Client states that she made decision to terminate pregnancy because she doesn't want to upset her boyfriend, and also because the baby may have some defects due to IUD. Clt reports that her vision is better and that she's wearing her boyfriends contact lenses. Plan: Nurse encouraged client to to ensure that she's making an informed decision based on her own feelings and accurate medical advise. Nurse also advised client against wearing someone else's contact lenses.  Nurse will make a referral to [facility] for client.  Will follow up with client on next week.   Gerrald Basu D. Joneen Caraway MSN, [place] [employer]  [phone]

## 2021-09-13 DIAGNOSIS — Z419 Encounter for procedure for purposes other than remedying health state, unspecified: Secondary | ICD-10-CM | POA: Diagnosis not present

## 2021-10-03 IMAGING — US US MFM FETAL BPP W/O NON-STRESS
1 series · 14 of 28 positions shown · non-contrast
Comparison: none

[Series 1: us mfm fetal bpp w/o non-stress · 35 acquisitions, 14 frames shown]
[im 2/35]
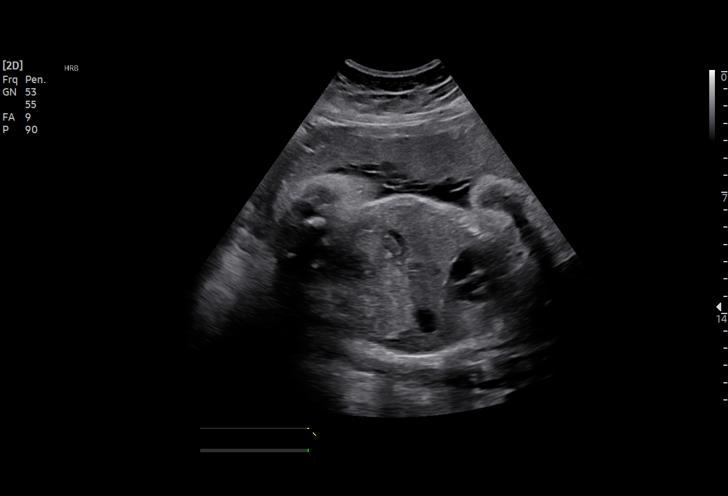
[im 4/35]
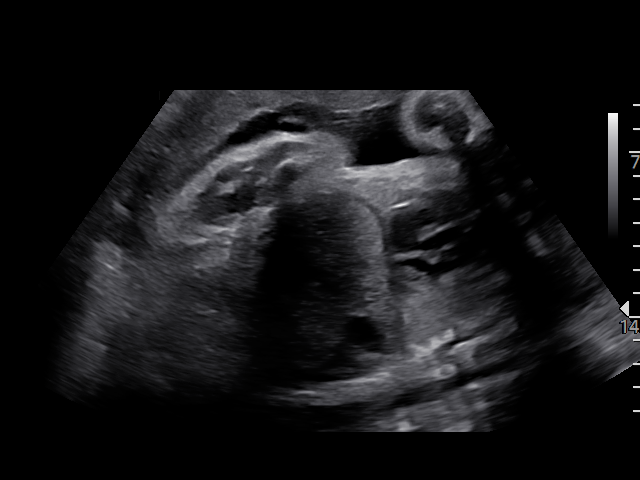
[im 7/35]
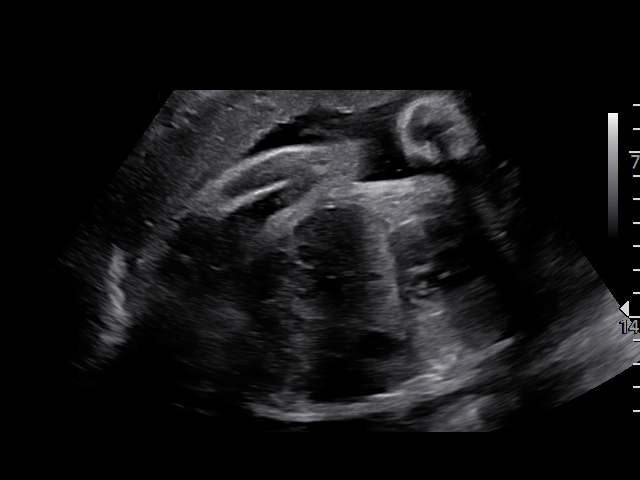
[im 9/35]
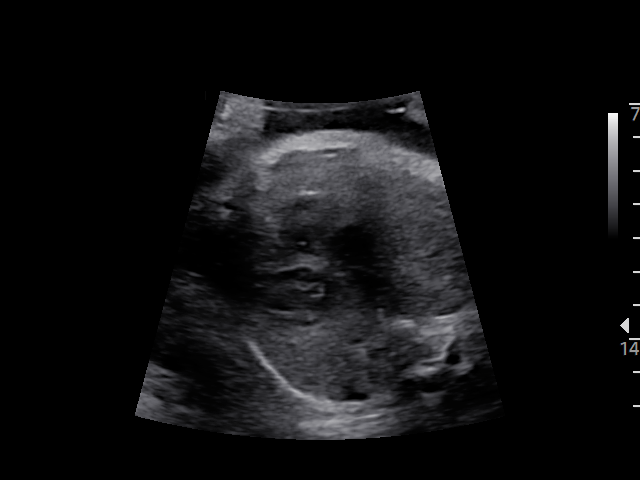
[im 12/35]
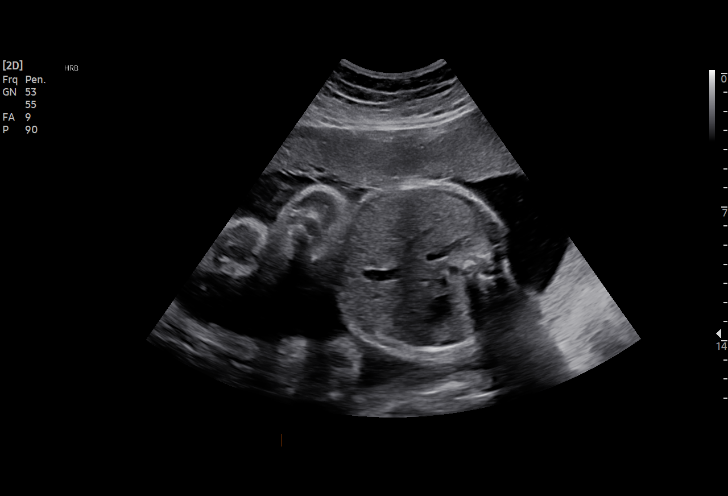
[im 14/35]
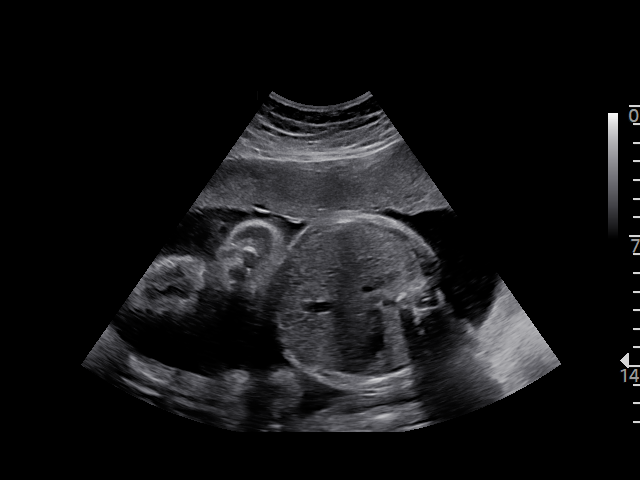
[im 17/35]
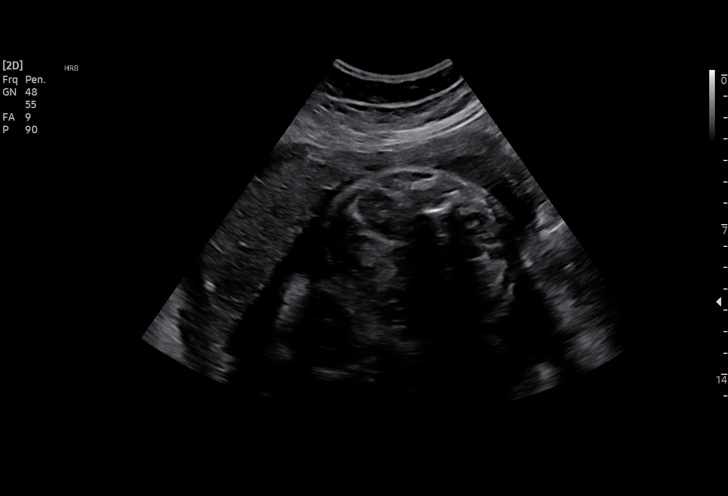
[im 19/35]
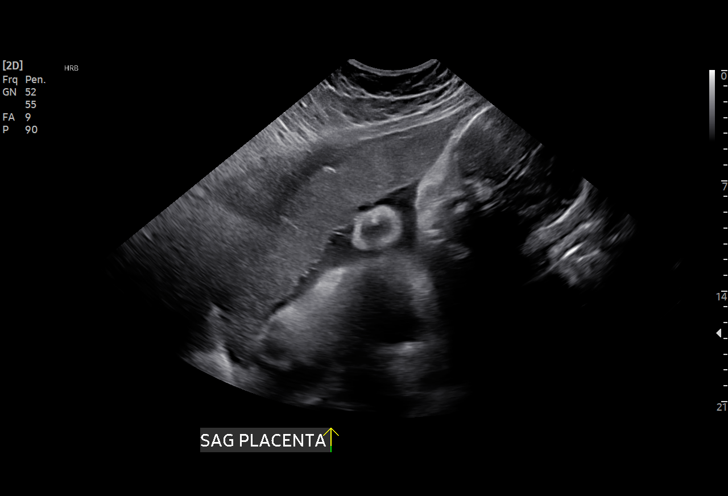
[im 22/35]
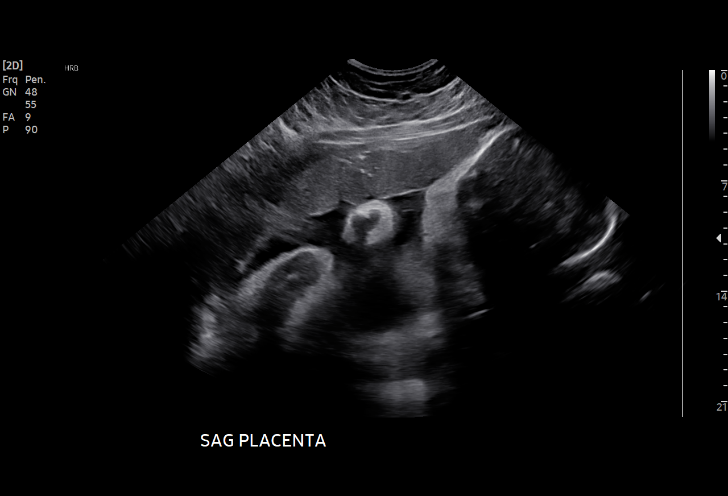
[im 24/35]
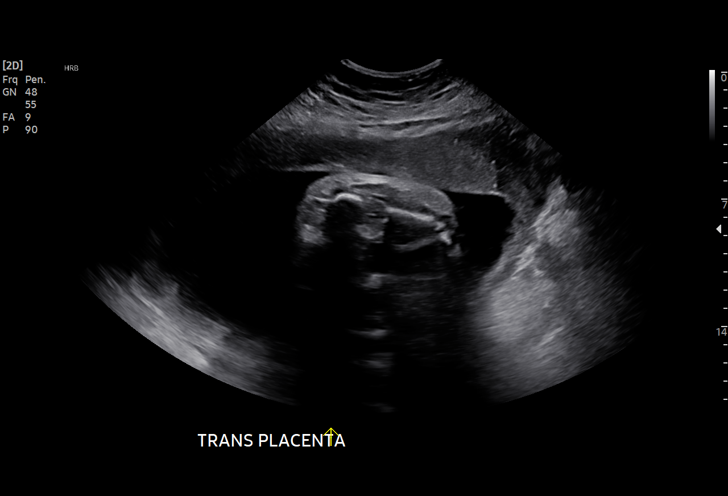
[im 27/35]
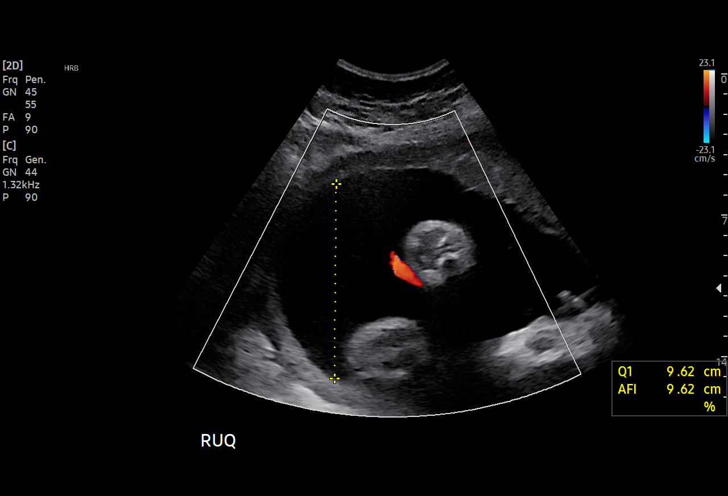
[im 29/35]
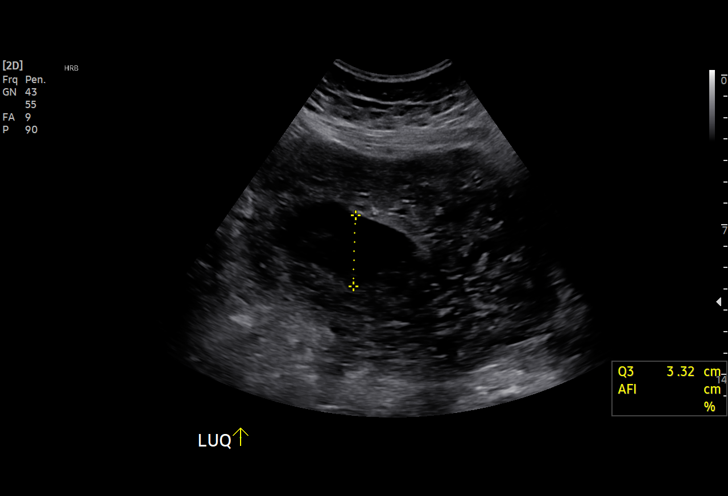
[im 32/35]
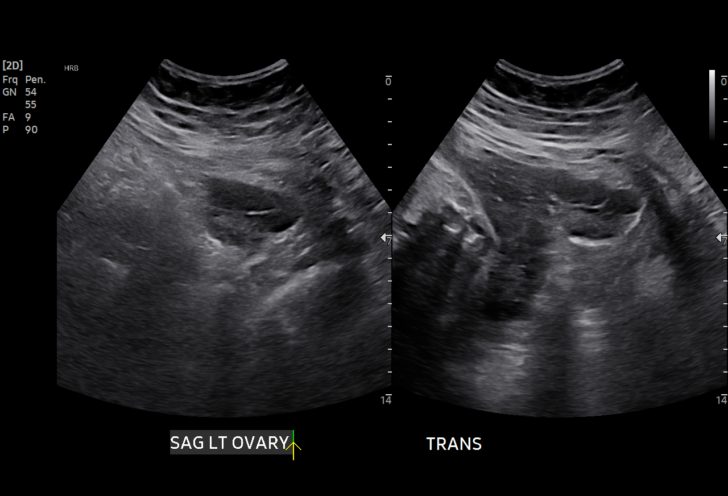
[im 35/35]
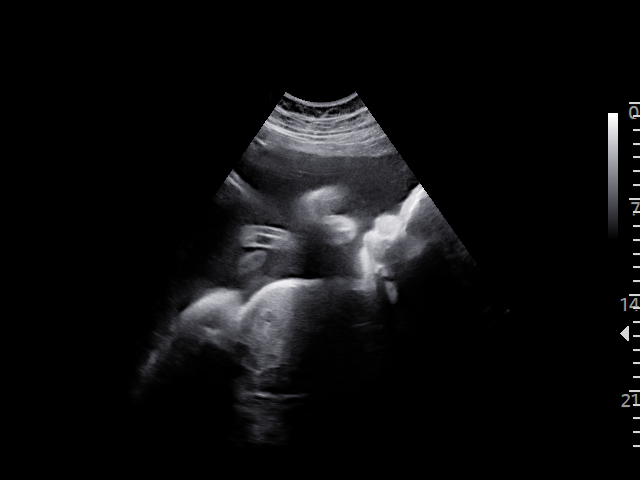

[14 of 28 positions shown; findings below may reference images not displayed]

Indications

 33 weeks gestation of pregnancy
 Obesity complicating pregnancy, third
 trimester
 Gestational diabetes in pregnancy,
 unspecified control
 Abnormality fetal heart rate or rhythm, 3rd
 trimester
 Low risk NIPS, neg Horizon, neg AFP
 Fetal abnormality - other known or
 suspected (absent NB)
 Encounter for other antenatal screening
 follow-up
 Polyhydramnios, third trimester, antepartum
 condition or complication, unspecified fetus
Fetal Evaluation

 Num Of Fetuses:         1
 Fetal Heart Rate(bpm):  158
 Cardiac Activity:       Observed
 Presentation:           Cephalic
 Placenta:               Anterior
 P. Cord Insertion:      Previously Visualized

 Amniotic Fluid
 AFI FV:      Subjectively upper-normal

 AFI Sum(cm)     %Tile       Largest Pocket(cm)
 24.83           95

 RUQ(cm)       RLQ(cm)       LUQ(cm)        LLQ(cm)

Biophysical Evaluation

 Amniotic F.V:   Pocket => 2 cm             F. Tone:        Observed
 F. Movement:    Observed                   Score:          [DATE]
 F. Breathing:   Observed
OB History

 Gravidity:    1         Term:   0        Prem:   0        SAB:   0
 TOP:          0       Ectopic:  0        Living: 0
Gestational Age

 LMP:           32w 4d        Date:  01/24/19                 EDD:   10/31/19
 Best:          33w 1d     Det. By:  Early Ultrasound         EDD:   10/27/19
                                     (03/02/19)
Anatomy

 Cranium:               Previously seen        LVOT:                   Previously seen
 Cavum:                 Previously seen        Aortic Arch:            Previously seen
 Ventricles:            Previously seen        Ductal Arch:            Previously seen
 Choroid Plexus:        Previously seen        Diaphragm:              Appears normal
 Cerebellum:            Previously seen        Stomach:                Appears normal, left
                                                                       sided
 Posterior Fossa:       Previously seen        Abdomen:                Previously seen
 Nuchal Fold:           Previously seen        Abdominal Wall:         Previously seen
 Face:                  Absent nasal bone      Cord Vessels:           Previously seen
                        prev.
 Lips:                  Previously seen        Kidneys:                Appear normal
 Palate:                Previously seen        Bladder:                Appears normal
 Thoracic:              Previously seen        Spine:                  Previously seen
 Heart:                 Previously seen        Upper Extremities:      Previously seen
 RVOT:                  Previously seen        Lower Extremities:      Previously seen

 Other:  Heels and Lt 5th digit previously visualized.
Cervix Uterus Adnexa

 Cervix
 Normal appearance by transabdominal scan.

 Uterus
 No abnormality visualized.

 Right Ovary
 Not visualized.

 Left Ovary
 Within normal limits.

 Cul De Sac
 No free fluid seen.

 Adnexa
 No abnormality visualized.
Impression

 Antental testing per provider request. She has [id number] and
 polyhydramnios.
 Biophysical profile [DATE] with normal amniotic fluid and fetal
 movement., however, the amniotic fluid was at the upper
 limits of normal.
 In addition we did not observe PAC's as in the previous study.
Recommendations

 Continue weekly testing.

## 2021-10-13 DIAGNOSIS — Z419 Encounter for procedure for purposes other than remedying health state, unspecified: Secondary | ICD-10-CM | POA: Diagnosis not present

## 2021-10-20 IMAGING — US US MFM OB FOLLOW-UP
1 series · 13 of 28 positions shown · non-contrast
Comparison: none

[Series 1: us mfm ob follow-up · 38 acquisitions, 13 frames shown]
[im 2/38]
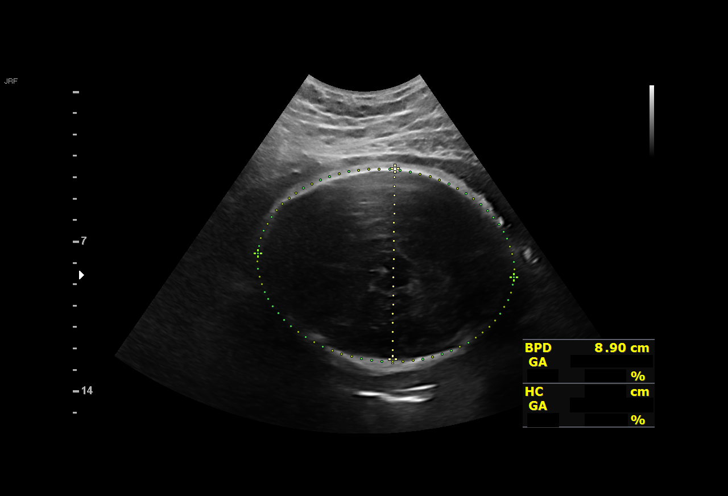
[im 5/38]
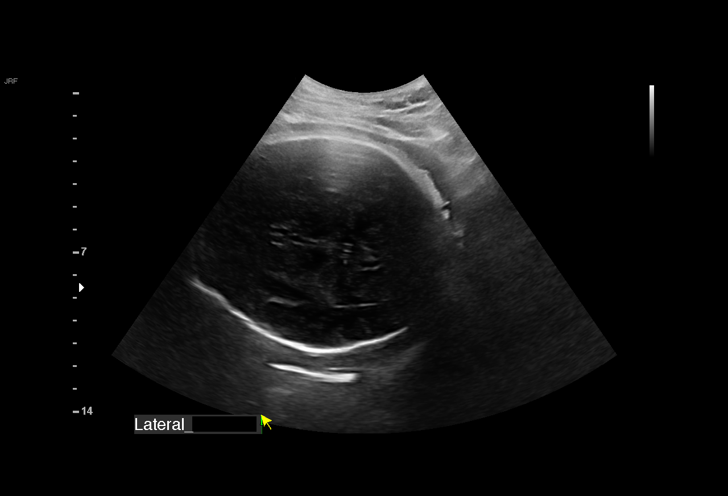
[im 7/38]
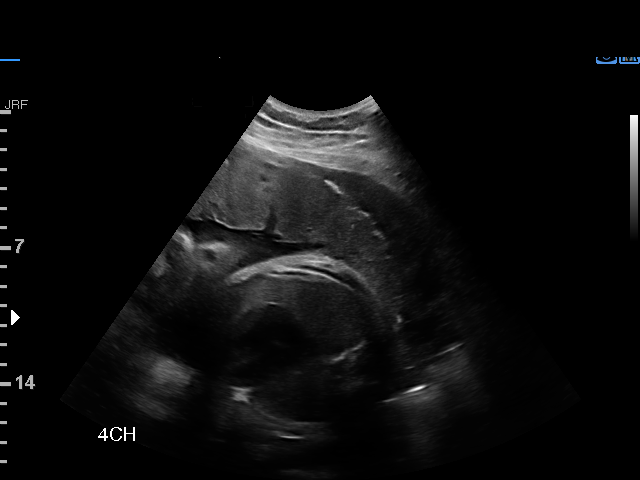
[im 10/38]
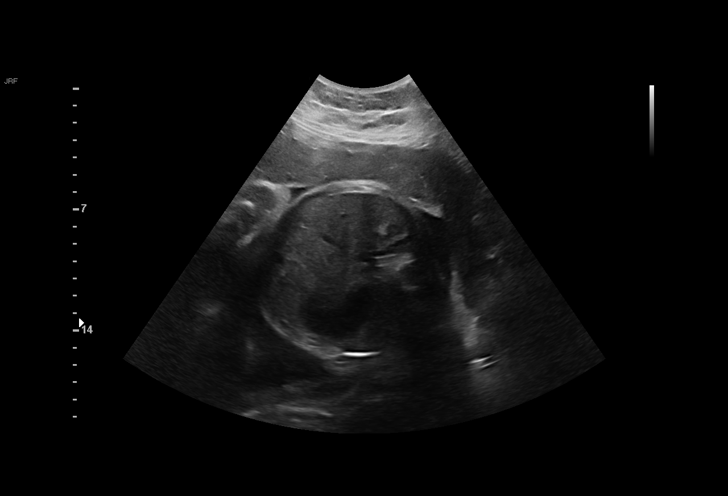
[im 13/38]
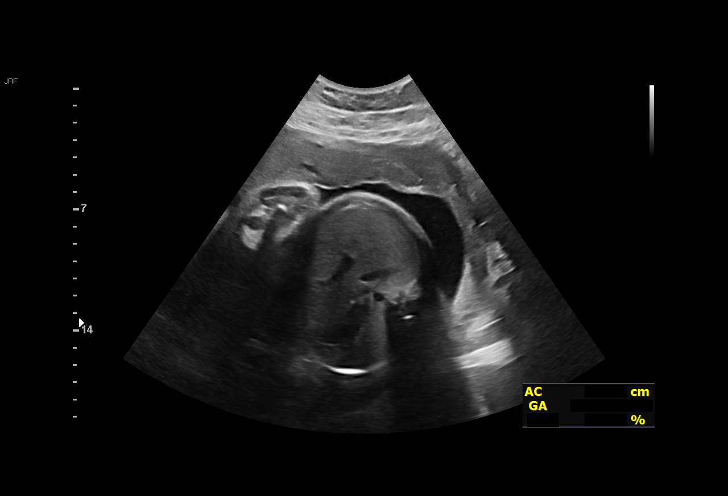
[im 16/38]
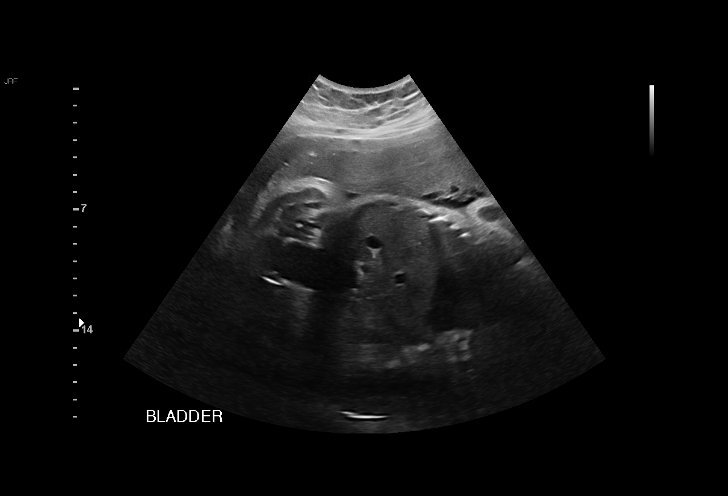
[im 20/38]
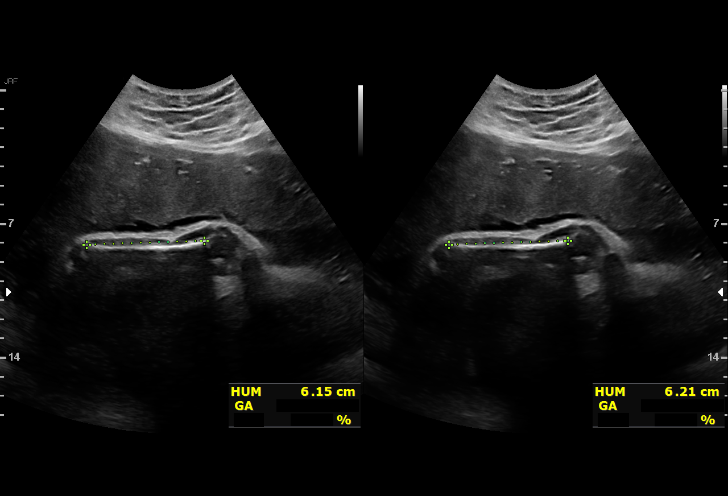
[im 22/38]
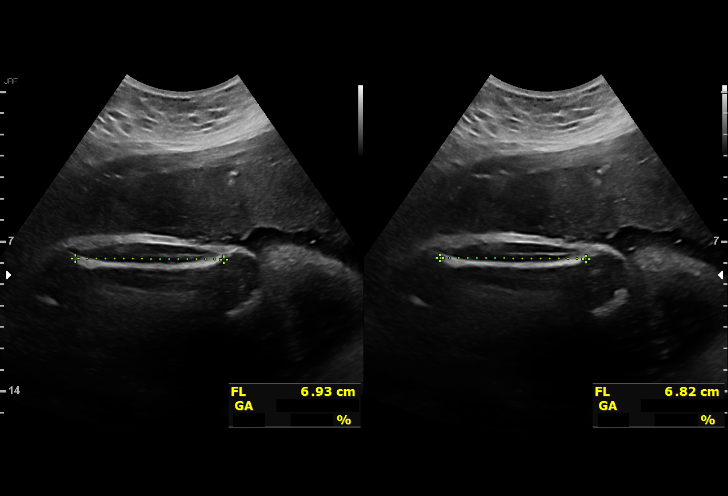
[im 25/38]
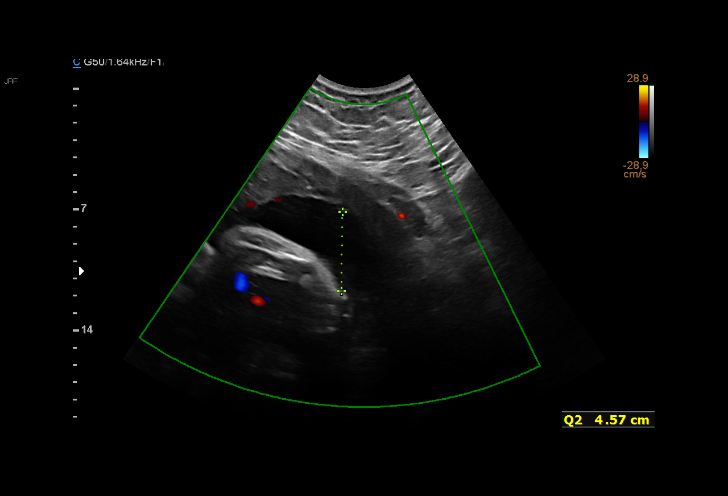
[im 28/38]
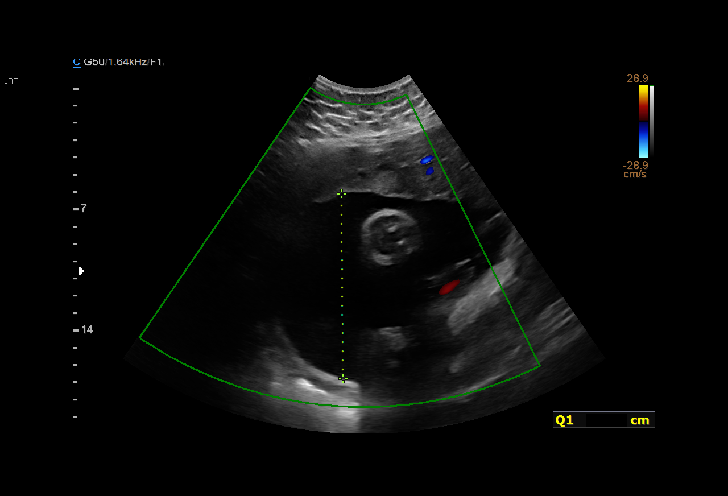
[im 31/38]
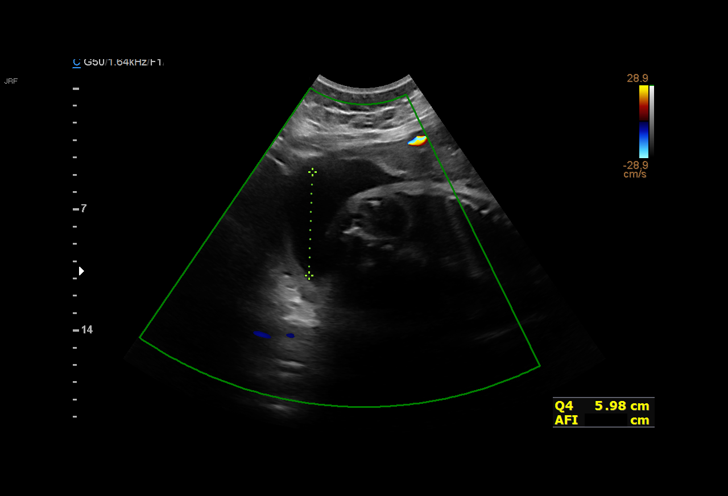
[im 33/38]
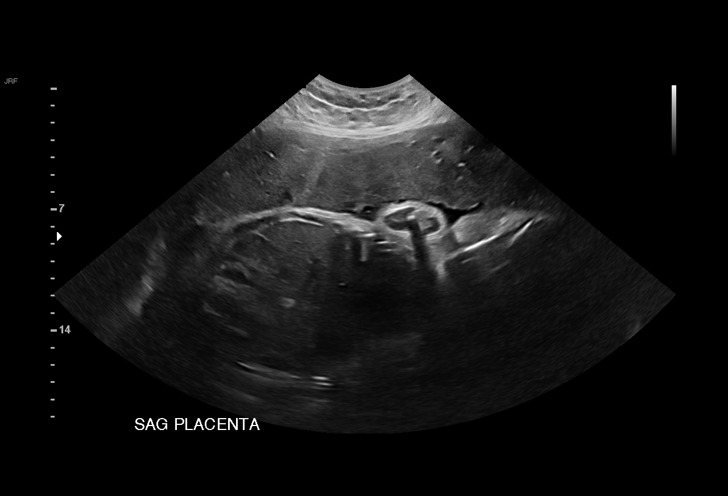
[im 36/38]
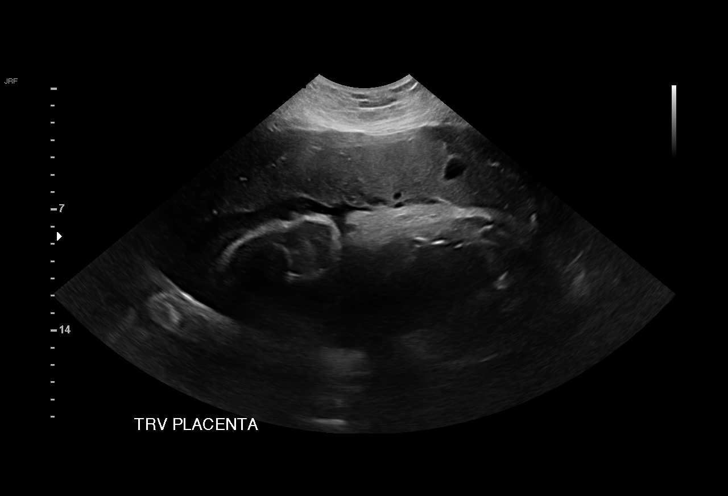

[13 of 28 positions shown; findings below may reference images not displayed]

at [REDACTED]

Indications

 Obesity complicating pregnancy, third
 trimester
 Gestational diabetes in pregnancy, diet
 controlled
 Fetal abnormality - other known or
 suspected (absent NB)
 Polyhydramnios, third trimester, antepartum
 condition or complication, unspecified fetus
 35 weeks gestation of pregnancy
Fetal Evaluation

 Num Of Fetuses:          1
 Fetal Heart              147
 Rate(bpm):
 Cardiac Activity:        Observed
 Presentation:            Cephalic
 Placenta:                Anterior

 Amniotic Fluid
 AFI FV:      Polyhydramnios

 AFI Sum(cm)     %Tile       Largest Pocket(cm)
 26.4            96

 RUQ(cm)       RLQ(cm)       LUQ(cm)        LLQ(cm)

Biophysical Evaluation

 Amniotic F.V:   Within normal limits       F. Tone:         Observed
 F. Movement:    Observed                   Score:           [DATE]
 F. Breathing:   Observed
Biometry

 BPD:      91.4  mm     G. Age:  37w 1d         90  %    CI:         77.15  %    70 - 86
                                                         FL/HC:       21.0  %    20.1 -
 HC:      329.5  mm     G. Age:  37w 3d         66  %    HC/AC:       0.98       0.93 -
 AC:      336.3  mm     G. Age:  37w 4d         96  %    FL/BPD:      75.6  %    71 - 87
 FL:       69.1  mm     G. Age:  35w 3d         41  %    FL/AC:       20.5  %    20 - 24
 HUM:      62.3  mm     G. Age:  36w 1d         74  %

 Est. FW:    8406   g    6 lb 13 oz      85  %
                    m
OB History

 Gravidity:    1         Term:   0        Prem:   0         SAB:   0
 TOP:          0       Ectopic:  0        Living: 0
Gestational Age

 LMP:           35w 0d        Date:  01/24/19                 EDD:    10/31/19
 U/S Today:     36w 6d                                        EDD:    10/18/19
 Best:          35w 4d     Det. By:  Early Ultrasound         EDD:    10/27/19
                                     (03/02/19)
Anatomy

 Cranium:               Appears normal         LVOT:                   Previously seen
 Cavum:                 Appears normal         Aortic Arch:            Previously seen
 Ventricles:            Appears normal         Ductal Arch:            Previously seen
 Choroid Plexus:        Previously seen        Diaphragm:              Previously seen
 Cerebellum:            Previously seen        Stomach:                Appears normal,
                                                                       left sided
 Posterior Fossa:       Previously seen        Abdomen:                Previously seen
 Nuchal Fold:           Previously seen        Abdominal Wall:         Previously seen
 Face:                  Absent nasal bone      Cord Vessels:           Previously seen
 Lips:                  Previously seen        Kidneys:                Appear normal
 Palate:                Previously seen        Bladder:                Appears normal
 Thoracic:              Previously seen        Spine:                  Previously seen
 Heart:                 Appears normal         Upper Extremities:      Previously seen
                        (4CH, axis, and
                        situs)
 RVOT:                  Appears normal         Lower Extremities:      Previously seen
Impression

 Follow up growth with known [id number] with uncertain
 documented blood glucose levels
 Normal interval growth with measurements consistent with
 dates.
 A biophysical profile of [DATE] was observed with good fetal
 movement however, polyhydramnios is again noted.
 (elevated two visits ago last visit was normal).
 She has not received diabetic teaching at this time it was
 [facility] at [facility] pm.  We
 reviewed diet and exercise today. While she did not have her
 blood sugar logs she reported her FBS in the 80-90's and her
 1hr GTT less than 960's.
 Random blood sugar today was 78 mg/dL.
Recommendations

 Continue weekly testing
 Consider delivery between 37-39 weeks pending fetal testing
 and maternal blood sugars.

## 2021-11-03 IMAGING — US US MFM FETAL BPP W/O NON-STRESS
1 series · 15 of 20 positions shown · non-contrast
Comparison: none

[Series 1: us mfm fetal bpp w/o non-stress · 20 acquisitions, 15 frames shown]
[im 1/20]
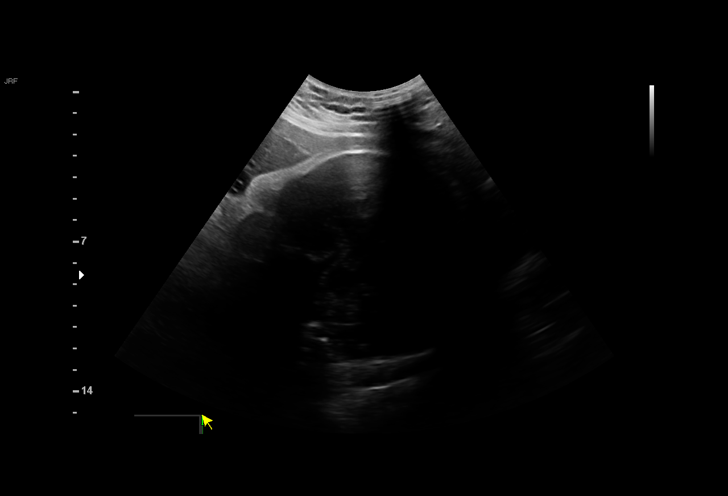
[im 3/20]
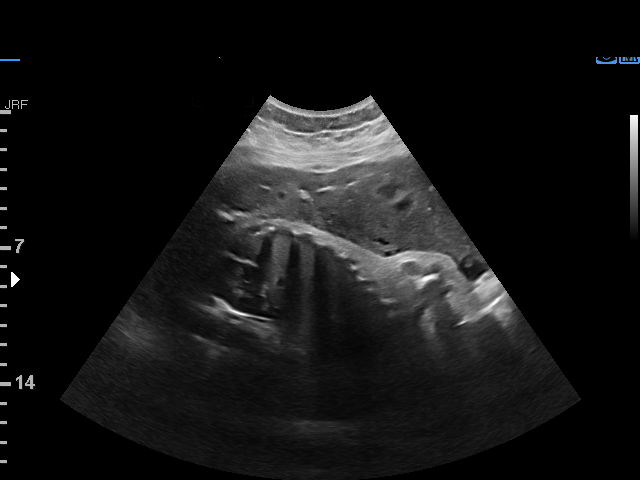
[im 4/20]
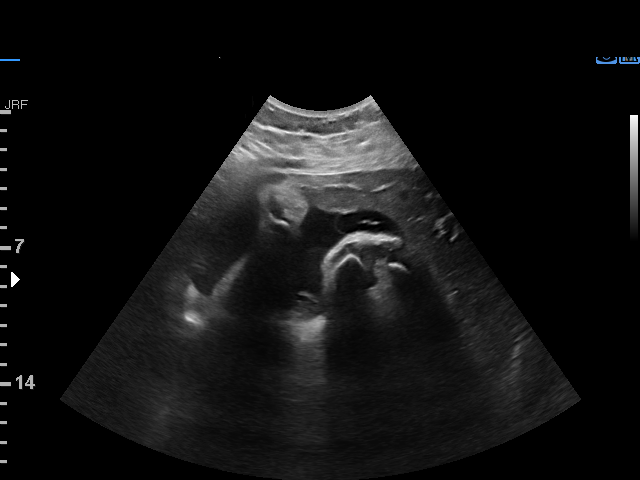
[im 5/20]
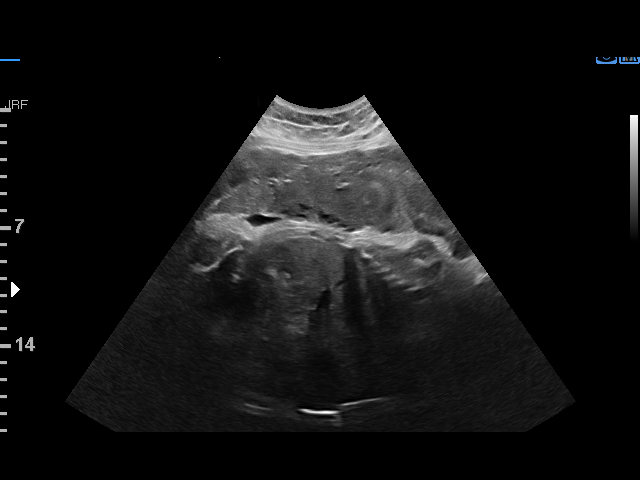
[im 7/20]
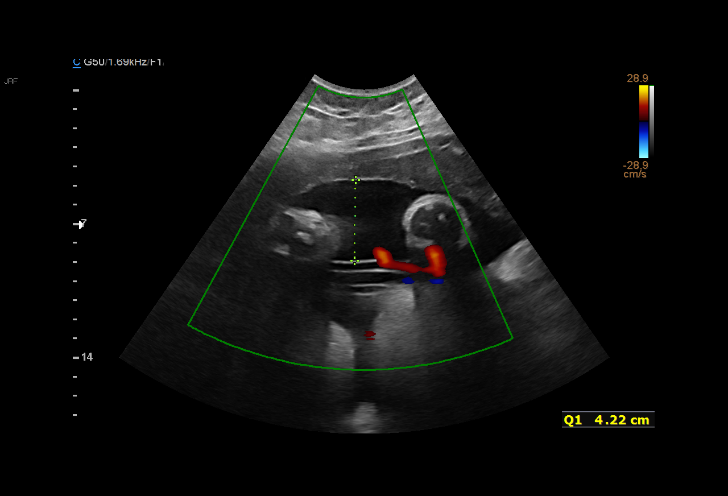
[im 8/20]
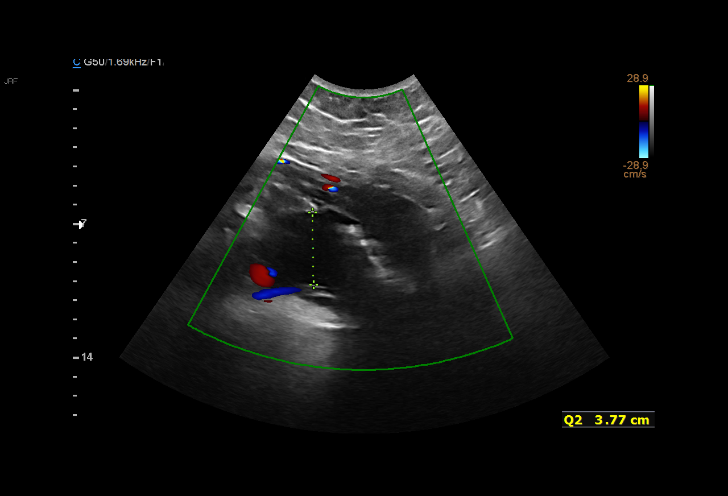
[im 9/20]
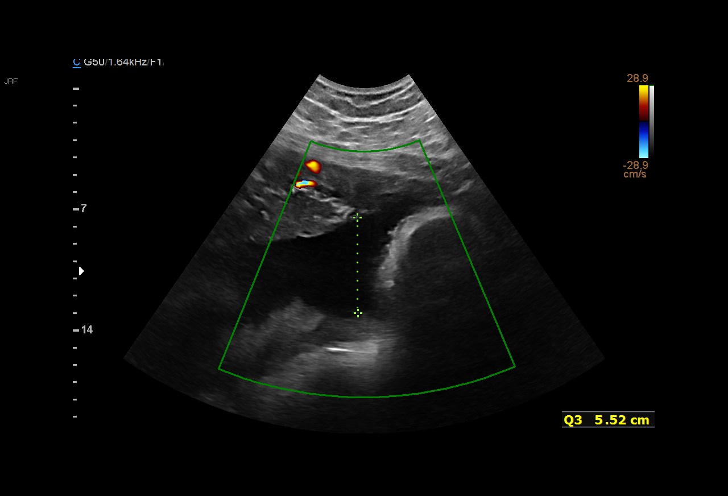
[im 11/20]
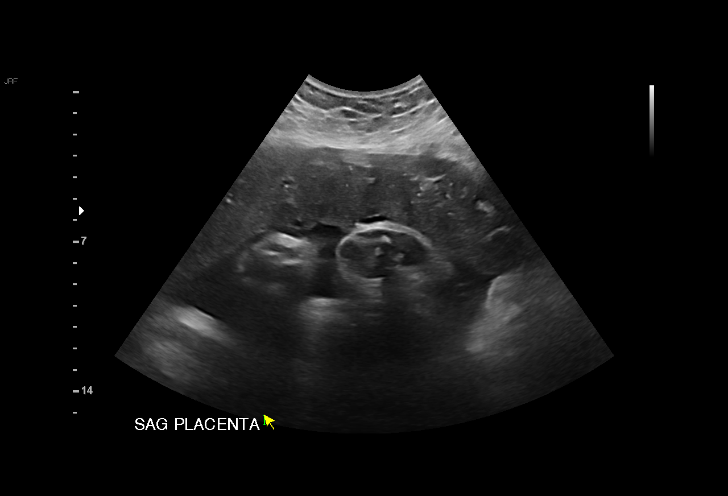
[im 12/20]
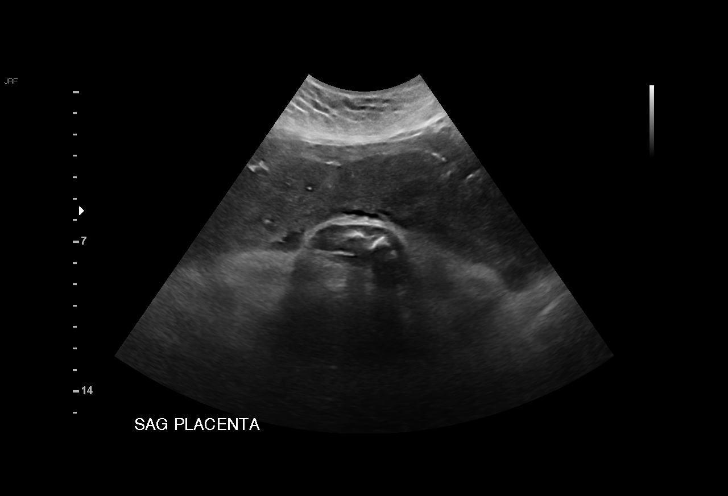
[im 13/20]
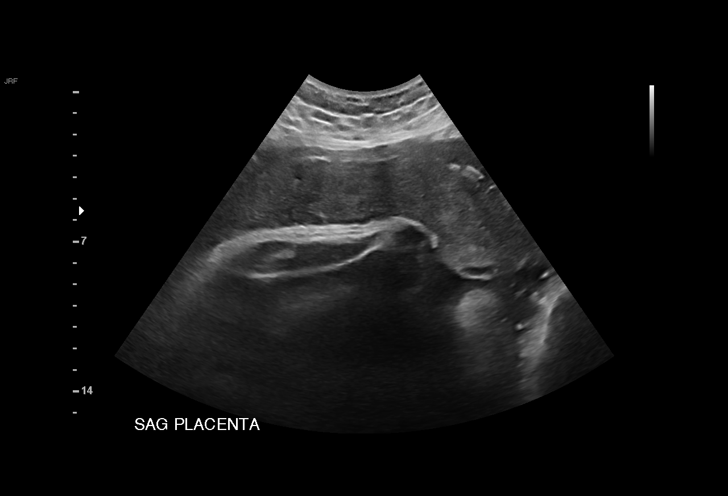
[im 15/20]
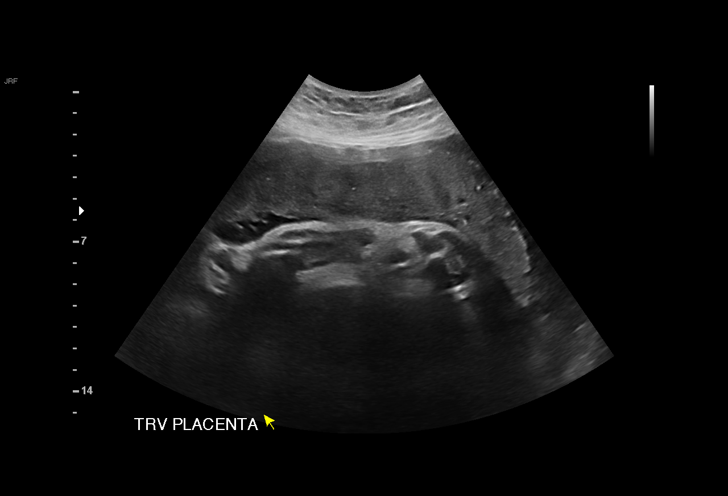
[im 16/20]
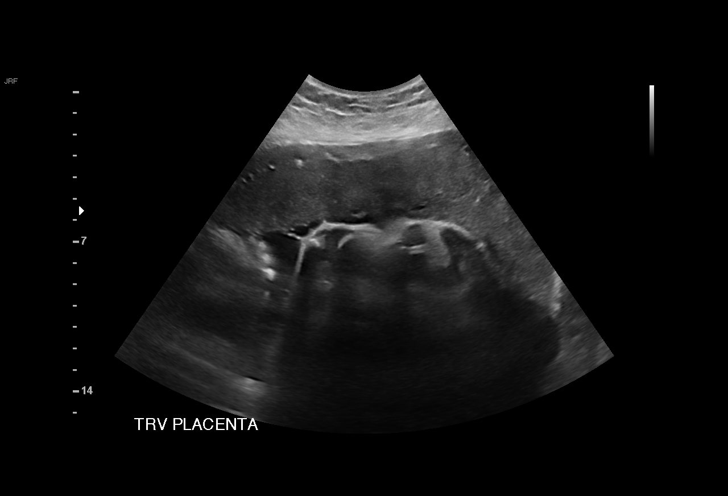
[im 17/20]
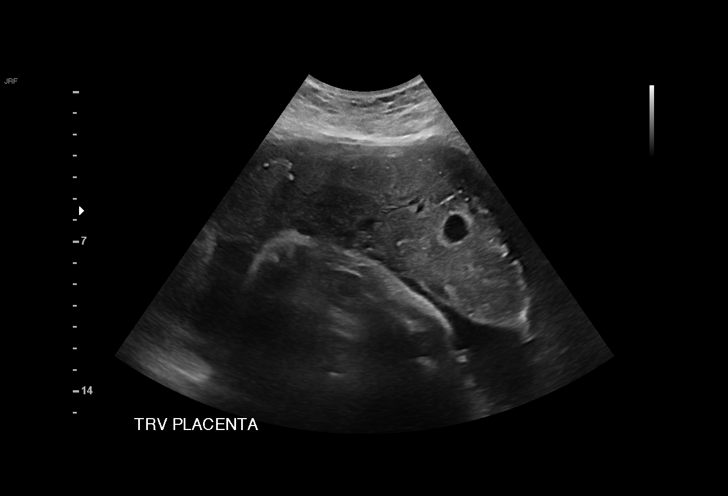
[im 19/20]
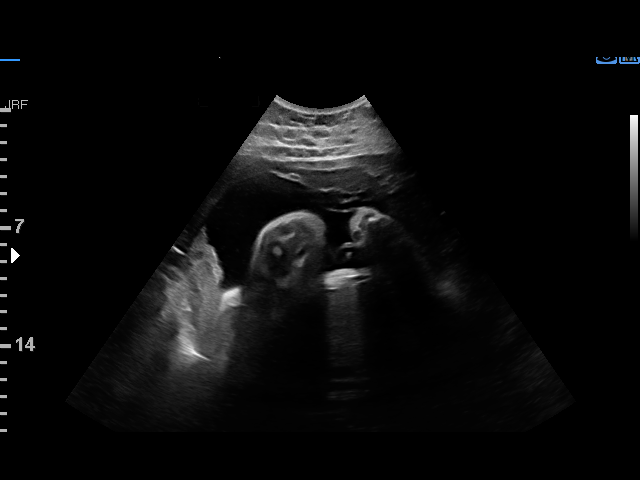
[im 20/20]
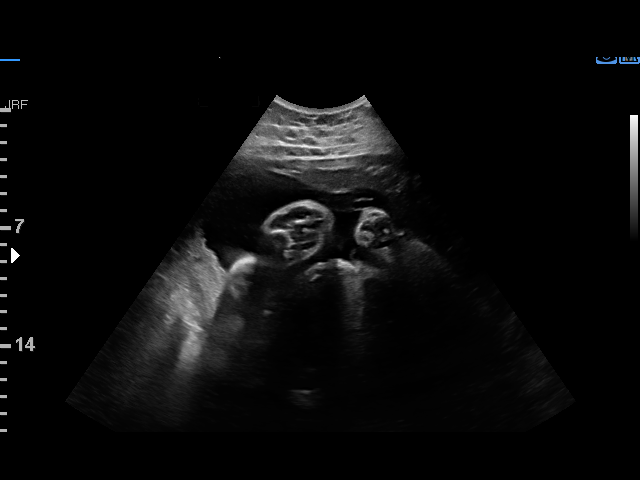

[15 of 20 positions shown; findings below may reference images not displayed]

at [REDACTED]

Indications

 37 weeks gestation of pregnancy
 Obesity complicating pregnancy, third
 trimester
 Gestational diabetes in pregnancy, diet
 controlled
 Fetal abnormality - other known or
 suspected (absent NB)
 Polyhydramnios, third trimester, antepartum
 condition or complication, unspecified fetus
Fetal Evaluation

 Num Of Fetuses:         1
 Fetal Heart Rate(bpm):  157
 Cardiac Activity:       Observed
 Presentation:           Cephalic
 Placenta:               Anterior

 AFI Sum(cm)     %Tile       Largest Pocket(cm)
 17.8            69

 RUQ(cm)       RLQ(cm)       LUQ(cm)        LLQ(cm)
 6.9           2
Biophysical Evaluation

 Amniotic F.V:   Within normal limits       F. Tone:        Observed
 F. Movement:    Observed                   Score:          [DATE]
 F. Breathing:   Observed
OB History
 Gravidity:    1         Term:   0        Prem:   0        SAB:   0
 TOP:          0       Ectopic:  0        Living: 0
Gestational Age

 LMP:           37w 0d        Date:  01/24/19                 EDD:   10/31/19
 Best:          37w 4d     Det. By:  Early Ultrasound         EDD:   10/27/19
                                     (03/02/19)
Impression

 Gestational diabetes. Controlled on diet. Patient reports she
 discussed with her diabetic educator. Some fasting levels are
 slightly increased but postprandial levels are reportedly
 normal.
 Amniotic fluid is normal and good fetal activity is seen.
 Cephalic presentation. Antenatal testing is reassuring. BPP
 [DATE].
 I reassured the patient that previously-seen polyhydramnios
 has resolved.
Recommendations

 -Fetal growth and BPP at [facility], [REDACTED] next week (patient's
 request).
 -Continue weekly BPP till delivery.
 -Consider delivery at 39 weeks or 40 weeks provided her
 blood glucose levels are within normal range.
                 Remache, Ronald Lee

## 2021-11-10 IMAGING — US US MFM OB FOLLOW-UP
1 series · 14 of 28 positions shown · non-contrast
Comparison: none

[Series 1: us mfm ob follow-up · 38 acquisitions, 14 frames shown]
[im 2/38]
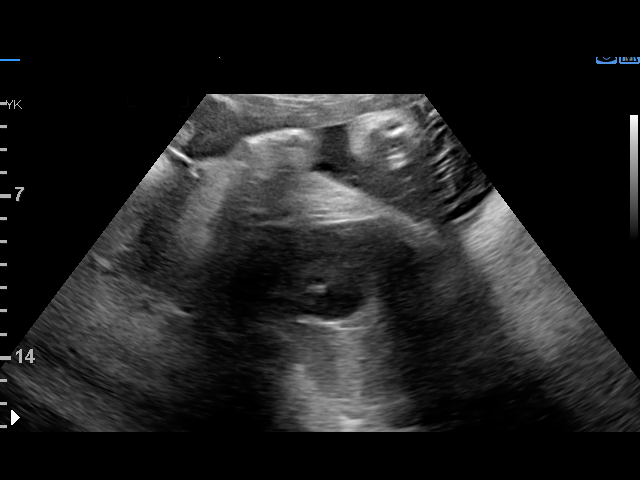
[im 5/38]
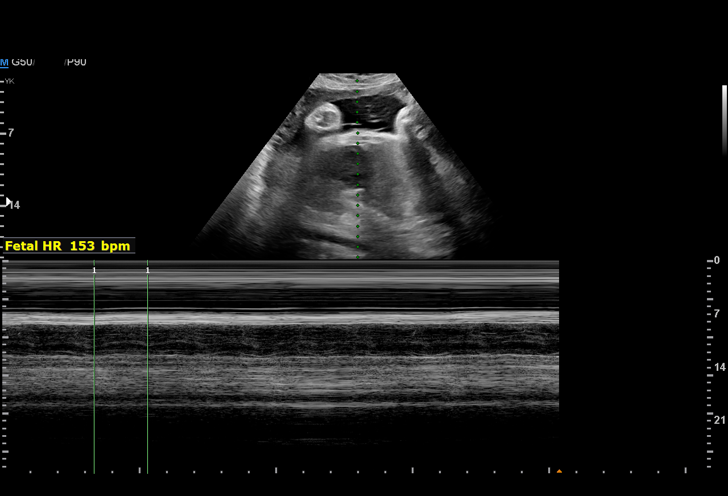
[im 7/38]
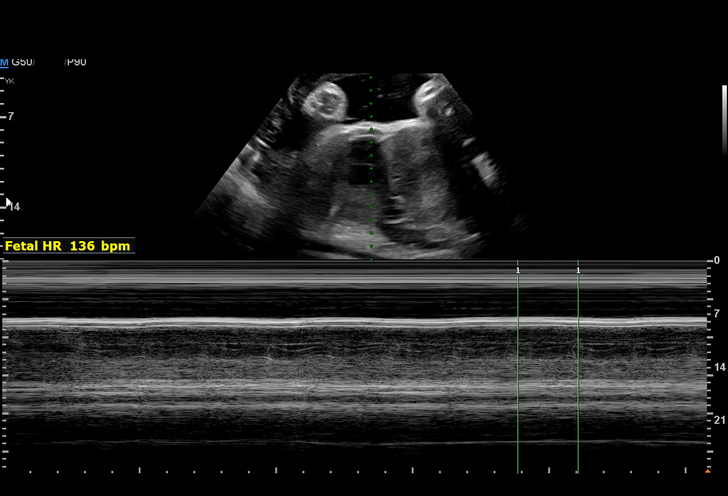
[im 10/38]
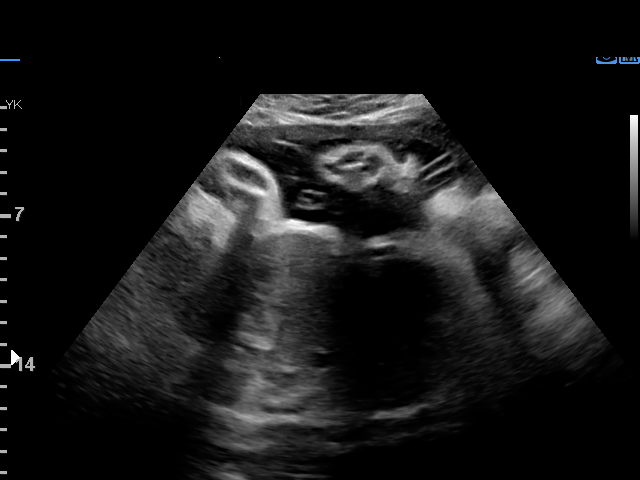
[im 13/38]
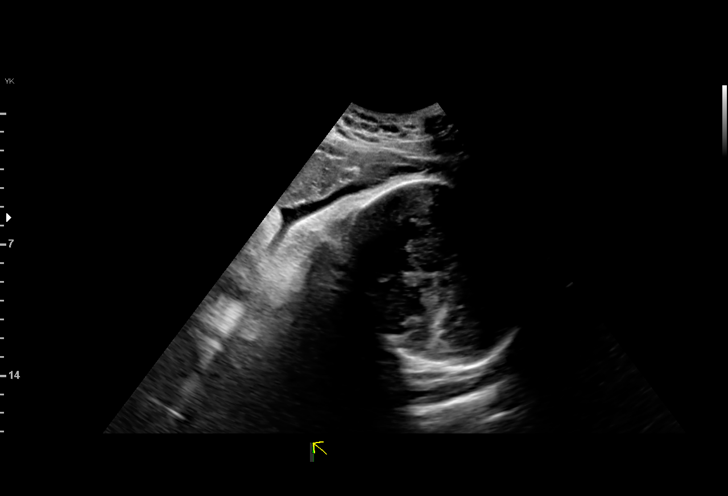
[im 16/38]
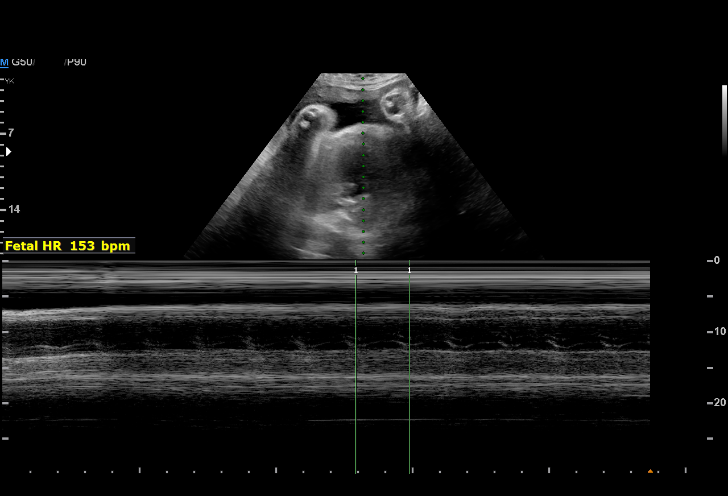
[im 18/38]
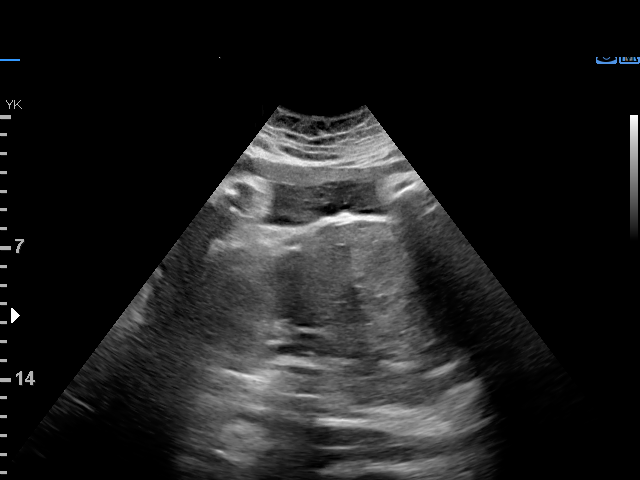
[im 21/38]
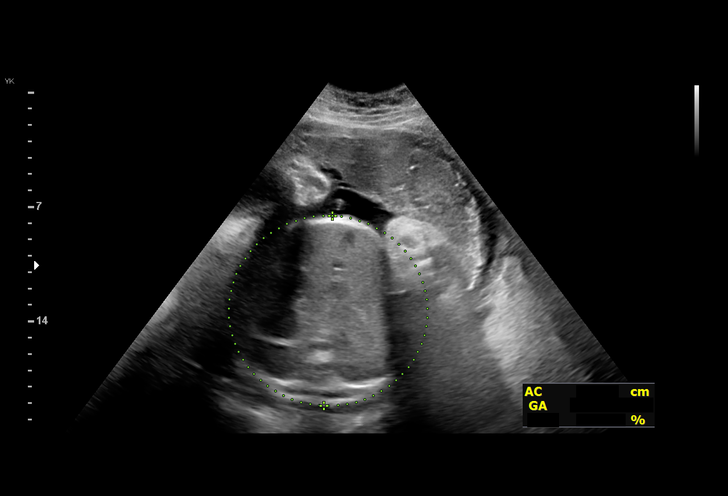
[im 24/38]
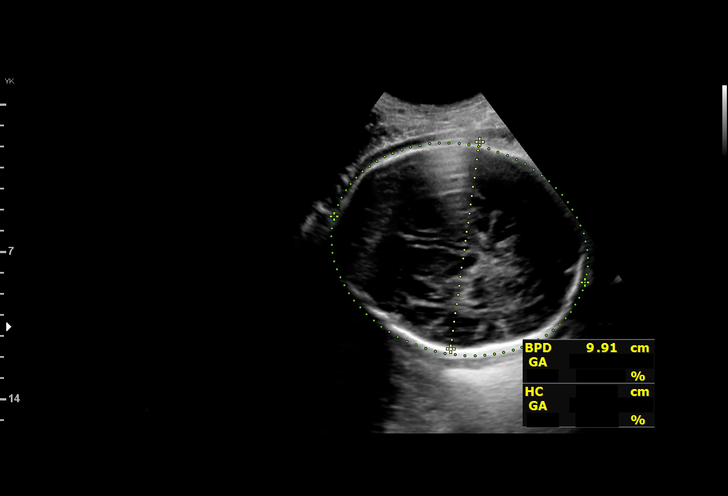
[im 27/38]
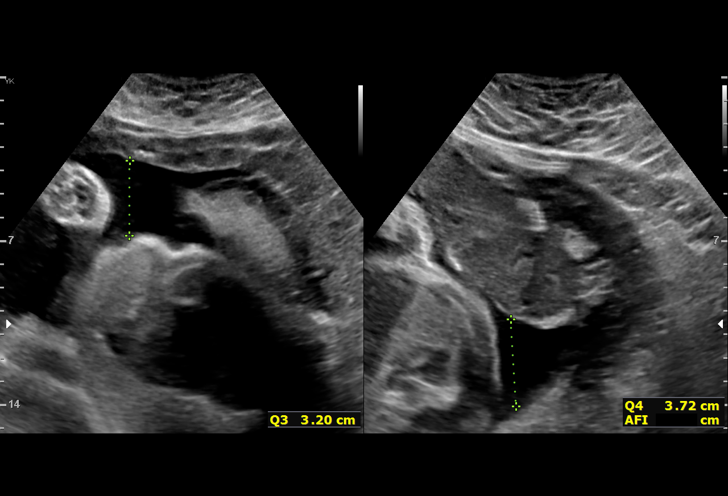
[im 29/38]
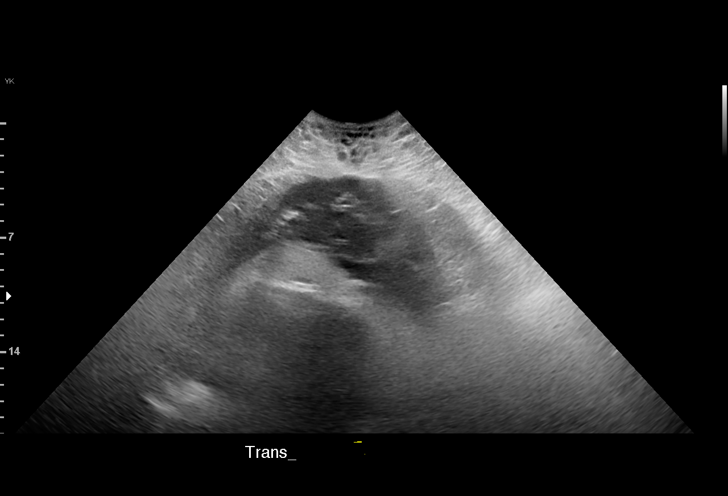
[im 32/38]
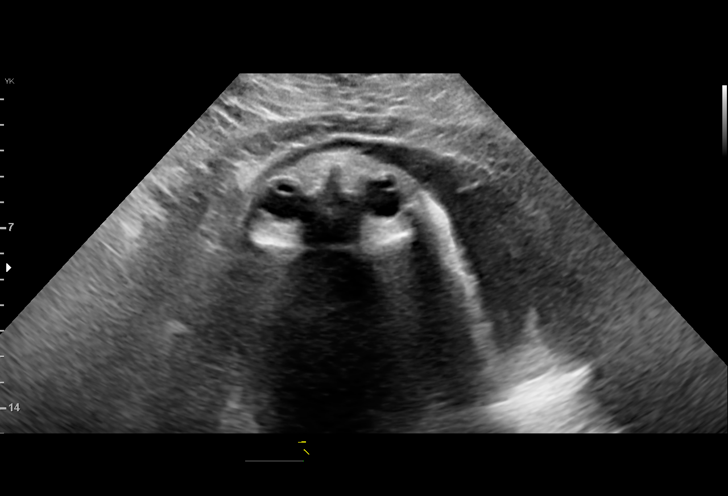
[im 35/38]
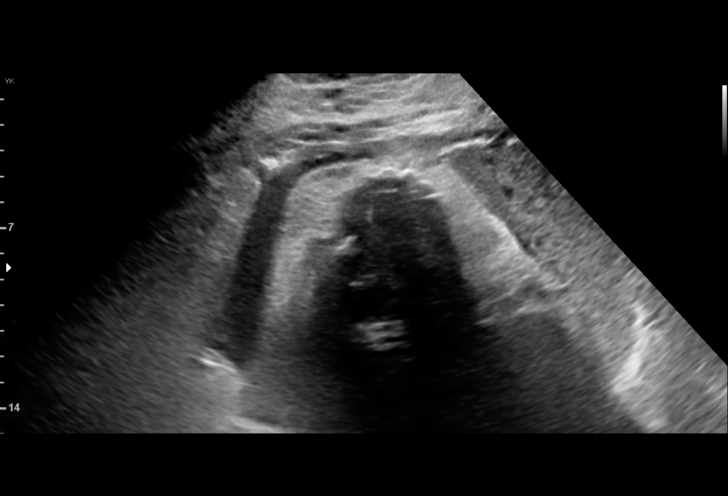
[im 38/38]
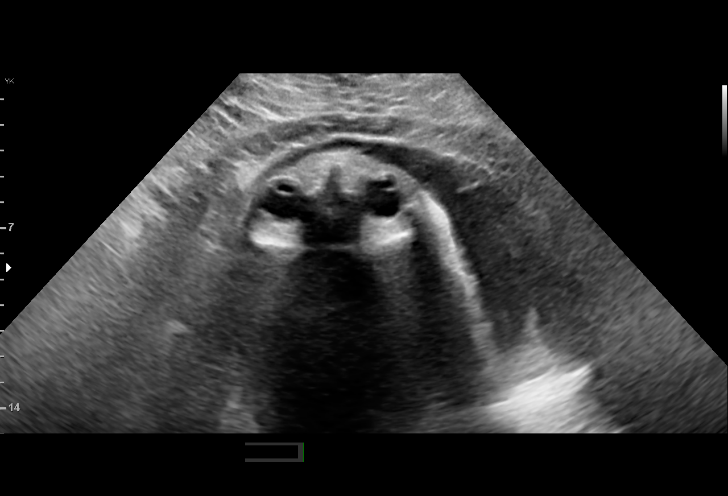

[14 of 28 positions shown; findings below may reference images not displayed]

Indications

 Obesity complicating pregnancy, third
 trimester
 Gestational diabetes in pregnancy, diet
 controlled
 Fetal abnormality - other known or
 suspected (absent NB)
 Polyhydramnios, third trimester, antepartum
 condition or complication, unspecified fetus
 (resolved)
 38 weeks gestation of pregnancy
Fetal Evaluation

 Num Of Fetuses:         1
 Fetal Heart Rate(bpm):  136
 Cardiac Activity:       Observed
 Presentation:           Cephalic
 Placenta:               Anterior
 P. Cord Insertion:      Previously Visualized

 Amniotic Fluid
 AFI FV:      Within normal limits

 AFI Sum(cm)     %Tile       Largest Pocket(cm)
 19.91           81

 RUQ(cm)       RLQ(cm)       LUQ(cm)        LLQ(cm)

Biophysical Evaluation
 Amniotic F.V:   Within normal limits       F. Tone:        Observed
 F. Movement:    Observed                   Score:          [DATE]
 F. Breathing:   Observed
Biometry

 BPD:      97.1  mm     G. Age:  39w 5d         94  %    CI:        77.25   %    70 - 86
                                                         FL/HC:      21.6   %    20.6 -
 HC:      349.8  mm     G. Age:  40w 5d         82  %    HC/AC:      0.94        0.87 -
 AC:      370.6  mm     G. Age:  41w 0d         99  %    FL/BPD:     77.9   %    71 - 87
 FL:       75.6  mm     G. Age:  38w 5d         58  %    FL/AC:      20.4   %    20 - 24

 Est. FW:    9962  gm    8 lb 15 oz      95  %
OB History

 Gravidity:    1         Term:   0        Prem:   0        SAB:   0
 TOP:          0       Ectopic:  0        Living: 0
Gestational Age

 LMP:           38w 0d        Date:  01/24/19                 EDD:   10/31/19
 U/S Today:     40w 0d                                        EDD:   10/17/19
 Best:          38w 4d     Det. By:  Early Ultrasound         EDD:   10/27/19
                                     (03/02/19)
Anatomy

 Cranium:               Appears normal         LVOT:                   Previously seen
 Cavum:                 Previously seen        Aortic Arch:            Previously seen
 Ventricles:            Appears normal         Ductal Arch:            Previously seen
 Choroid Plexus:        Previously seen        Diaphragm:              Previously seen
 Cerebellum:            Previously seen        Stomach:                Appears normal, left
                                                                       sided
 Posterior Fossa:       Previously seen        Abdomen:                Previously seen
 Nuchal Fold:           Previously seen        Abdominal Wall:         Previously seen
 Face:                  Absent nasal           Cord Vessels:           Previously seen
                        bone, pev seen
 Lips:                  Appears normal         Kidneys:                Appear normal
 Palate:                Appears normal         Bladder:                Appears normal
 Thoracic:              Previously seen        Spine:                  Previously seen
 Heart:                 Previously seen        Upper Extremities:      Previously seen
 RVOT:                  Appears normal         Lower Extremities:      Previously seen

 Other:  Technicallly difficult due to advanced GA and maternal habitus.
Impression

 Gestational diabetes. Well-controlled on diet .
 On ultrasound, the estimated fetal weight is at the 95th
 percentile.  Amniotic fluid is normal and good fetal activity
 seen.  Antenatal testing is reassuring.  BPP [DATE].
 Blood pressure today at her office is 135/78 mmHg.
Recommendations

 Follow-up scans as clinically indicated.
 Recommend delivery at not later than 40 weeks gestation.
                 Ewers, Migge

## 2022-01-03 ENCOUNTER — Emergency Department (HOSPITAL_COMMUNITY)
Admission: EM | Admit: 2022-01-03 | Discharge: 2022-01-03 | Disposition: A | Discharge status: Home / Self Care | Payer: Medicaid Other | Attending: Emergency Medicine | Admitting: Emergency Medicine

## 2022-01-03 ENCOUNTER — Other Ambulatory Visit: Payer: Self-pay

## 2022-01-03 DIAGNOSIS — N390 Urinary tract infection, site not specified: Secondary | ICD-10-CM | POA: Diagnosis not present

## 2022-01-03 DIAGNOSIS — B9689 Other specified bacterial agents as the cause of diseases classified elsewhere: Secondary | ICD-10-CM | POA: Insufficient documentation

## 2022-01-03 DIAGNOSIS — R300 Dysuria: Secondary | ICD-10-CM | POA: Diagnosis present

## 2022-01-03 LAB — URINALYSIS, ROUTINE W REFLEX MICROSCOPIC
Bilirubin Urine: NEGATIVE
Glucose, UA: NEGATIVE mg/dL
Ketones, ur: NEGATIVE mg/dL
Nitrite: POSITIVE — AB
Protein, ur: NEGATIVE mg/dL
Specific Gravity, Urine: 1.017 (ref 1.005–1.030)
WBC, UA: >50 WBC/hpf — ABNORMAL HIGH (ref 0–5)
pH: 6 (ref 5.0–8.0)

## 2022-01-03 LAB — PREGNANCY, URINE: Preg Test, Ur: NEGATIVE

## 2022-01-03 MED ORDER — CEPHALEXIN 250 MG PO CAPS: 250.0000 mg | ORAL_CAPSULE | Freq: Four times a day (QID) | ORAL | 0 refills | Status: DC

## 2022-01-03 NOTE — ED Triage Notes (Signed)
Patient report dysuria x1 week. Pt denies N/V/D. Pt denies fever.

## 2022-01-03 NOTE — ED Notes (Signed)
Husbands visual test accidentally placed on wifes chart   it has also been added to his chart

## 2022-01-03 NOTE — ED Provider Triage Note (Signed)
Emergency Medicine Provider Triage Evaluation Note  Kim Crosby , a 21 y.o. female  was evaluated in triage.  Pt complains of dysuria in the last 7 days.  Patient reports occasional lower abdominal pain with urination.  Denies blood in her stool or urine.  Denies increased urinary frequency or urgency.  Patient is sexually active.  Denies abnormal vaginal discharge.  No other symptoms.  Review of Systems  Positive: As above Negative: As above  Physical Exam  BP 121/78   Pulse 77   Temp 98.8 F (37.1 C)   Resp 16   LMP 04/10/2021   SpO2 99%  Gen:   Awake, no distress   Resp:  Normal effort  MSK:   Moves extremities without difficulty  Other:    Medical Decision Making  Medically screening exam initiated at 5:33 PM.  Appropriate orders placed.  Kim Crosby was informed that the remainder of the evaluation will be completed by another provider, this initial triage assessment does not replace that evaluation, and the importance of remaining in the ED until their evaluation is complete.     Rex Kras, [place] 01/03/22 1734

## 2022-01-03 NOTE — Discharge Instructions (Addendum)
Please take your medications as prescribed. I recommend close follow-up with PCP for reevaluation.  Please do not hesitate to return to emergency department if worrisome signs symptoms we discussed become apparent.  

## 2022-01-03 NOTE — ED Provider Notes (Signed)
[facility] DEPT Provider Note   CSN: [id number] Arrival date & time: 01/03/22  1631     History  Chief Complaint  Patient presents with   Dysuria    Kim Crosby is a 21 y.o. female past medical history of anemia, anxiety presenting to the emergency department for evaluation of dysuria for the last 7 days.  Patient reports occasional lower abdominal pain with urination.  Denies blood in her stool or urine.  Denies increased urinary frequency or urgency.  Patient is sexually active.  Denies abnormal vaginal discharge.  No fever, chest pain, shortness of breath, headache, dizziness, bowel changes, rash.     Dysuria     Past Medical History:  Diagnosis Date   Anemia    Anxiety    Bipolar 2 disorder ([place])    Depression    Gestational diabetes    Medical history non-contributory    Retinitis    Past Surgical History:  Procedure Laterality Date   REFRACTIVE SURGERY Bilateral      Home Medications Prior to Admission medications   Medication Sig Start Date End Date Taking? Authorizing Provider  acetaminophen (TYLENOL) 325 MG tablet Take 2 tablets (650 mg total) by mouth every 4 (four) hours as needed (for pain scale < 4). Patient not taking: Reported on 11/08/2019 85/88/50   Arrie Senate, MD  amLODipine (NORVASC) 5 MG tablet Take 1 tablet (5 mg total) by mouth daily. Patient not taking: Reported on 01/18/2020 27/74/12   Arrie Senate, MD  Blood Glucose Monitoring Suppl (ACCU-CHEK NANO SMARTVIEW) w/Device KIT 1 kit by Subdermal route as directed. Check blood sugars for fasting, and two hours after breakfast, lunch and dinner (4 checks daily) Patient not taking: Reported on 11/08/2019 09/09/19   Gavin Pound, CNM  cephALEXin (KEFLEX) 250 MG capsule Take 1 capsule (250 mg total) by mouth 4 (four) times daily. 01/03/22   Rex Kras, PA  ferrous sulfate 325 (65 FE) MG EC tablet Take 1 tablet (325 mg total) by mouth daily with  breakfast. Patient not taking: Reported on 11/08/2019 08/13/19   Gavin Pound, CNM      Allergies    Pork-derived products    Review of Systems   Review of Systems  Genitourinary:  Positive for dysuria.    Physical Exam Updated Vital Signs BP 121/78   Pulse 77   Temp 98.8 F (37.1 C)   Resp 16   LMP 04/10/2021   SpO2 99%  Physical Exam Vitals and nursing note reviewed.  Constitutional:      Appearance: Normal appearance.  HENT:     Head: Normocephalic and atraumatic.     Mouth/Throat:     Mouth: Mucous membranes are moist.  Eyes:     General: No scleral icterus. Cardiovascular:     Rate and Rhythm: Normal rate and regular rhythm.     Pulses: Normal pulses.     Heart sounds: Normal heart sounds.  Pulmonary:     Effort: Pulmonary effort is normal.     Breath sounds: Normal breath sounds.  Abdominal:     General: Abdomen is flat.     Palpations: Abdomen is soft.     Tenderness: There is no abdominal tenderness.  Musculoskeletal:        General: No deformity.  Skin:    General: Skin is warm.     Findings: No rash.  Neurological:     General: No focal deficit present.     Mental Status: She  is alert.  Psychiatric:        Mood and Affect: Mood normal.     ED Results / Procedures / Treatments   Labs (all labs ordered are listed, but only abnormal results are displayed) Labs Reviewed  URINALYSIS, ROUTINE W REFLEX MICROSCOPIC - Abnormal; Notable for the following components:      Result Value   APPearance CLOUDY (*)    Hgb urine dipstick SMALL (*)    Nitrite POSITIVE (*)    Leukocytes,Ua MODERATE (*)    WBC, UA >50 (*)    Bacteria, UA FEW (*)    Non Squamous Epithelial 0-5 (*)    All other components within normal limits  PREGNANCY, URINE  GC/CHLAMYDIA PROBE AMP () NOT AT [facility]    EKG None  Radiology No results found.  Procedures Procedures    Medications Ordered in ED Medications - No data to display  ED Course/ Medical Decision  Making/ A&P                           Medical Decision Making Amount and/or Complexity of Data Reviewed Labs: ordered.   This patient presents to the ED for dysuria, this involves an extensive number of treatment options, and is a complaint that carries with a high risk of complications and morbidity.  The differential diagnosis includes UTI, STI, pyelonephritis, cystitis, kidney stone, appendicitis, ovarian torsion, ectopic pregnancy, SBO, infectious etiology.  This is not an exhaustive list.  Lab tests: I ordered and personally interpreted labs.  The pertinent results include: Urinalysis positive for nitrite and leukocytes with >150 white blood cell.  Imaging studies:   Problem list/ ED course/ Critical interventions/ Medical management: HPI: See above Vital signs within normal range and stable throughout visit. Laboratory/imaging studies significant for: See above. On physical examination, patient is afebrile and appears in no acute distress. This patient presents with symptoms consistent with acute uncomplicated cystitis. No systemic symptoms. Not septic. Well appearing. Low suspicion for acute pyelonephritis given lack of fever, CVAT, or systemic features. Low suspicion for kidney stone or infected stone. Upreg negative so doubt ectopic pregnancy. Low suspicion for ovarian torsion, PID, or appendicitis.  I will send an Rx of Keflex.  Advised patient to take her medication as prescribed.  Follow-up with primary care physician for further evaluation and management.  Return to the ER if new or worsening symptoms.   I have reviewed the patient home medicines and have made adjustments as needed.  Cardiac monitoring/EKG: The patient was maintained on a cardiac monitor.  I personally reviewed and interpreted the cardiac monitor which showed an underlying rhythm of: sinus rhythm.  Additional history obtained: External records from outside source obtained and reviewed including: Chart review  including previous notes, labs, imaging.  Consultations obtained:  Disposition Continued outpatient therapy. Follow-up with PCP recommended for reevaluation of symptoms. Treatment plan discussed with patient.  Pt acknowledged understanding was agreeable to the plan. Worrisome signs and symptoms were discussed with patient, and patient acknowledged understanding to return to the ED if they noticed these signs and symptoms. Patient was stable upon discharge.   This chart was dictated using voice recognition software.  Despite best efforts to proofread,  errors can occur which can change the documentation meaning.          Final Clinical Impression(s) / ED Diagnoses Final diagnoses:  Lower urinary tract infectious disease    Rx / DC Orders ED Discharge Orders  Ordered    cephALEXin (KEFLEX) 250 MG capsule  4 times daily,   Status:  Discontinued        01/03/22 1838    cephALEXin (KEFLEX) 250 MG capsule  4 times daily        01/03/22 1918              Rex Kras, [place] 01/03/22 1941    Drenda Freeze, MD 01/03/22 2352

## 2022-01-07 LAB — GC/CHLAMYDIA PROBE AMP (~~LOC~~) NOT AT ARMC
Chlamydia: NEGATIVE
Comment: NEGATIVE
Comment: NORMAL
Neisseria Gonorrhea: NEGATIVE

## 2022-01-27 ENCOUNTER — Encounter: Payer: Self-pay | Admitting: Obstetrics & Gynecology

## 2022-01-27 ENCOUNTER — Ambulatory Visit (INDEPENDENT_AMBULATORY_CARE_PROVIDER_SITE_OTHER): Payer: Medicaid Other | Admitting: Obstetrics & Gynecology

## 2022-01-27 VITALS — BP 116/85 | HR 82

## 2022-01-27 DIAGNOSIS — Z30011 Encounter for initial prescription of contraceptive pills: Secondary | ICD-10-CM | POA: Diagnosis not present

## 2022-01-27 DIAGNOSIS — Z332 Encounter for elective termination of pregnancy: Secondary | ICD-10-CM

## 2022-01-27 DIAGNOSIS — N390 Urinary tract infection, site not specified: Secondary | ICD-10-CM | POA: Diagnosis not present

## 2022-01-27 MED ORDER — NORETHIN ACE-ETH ESTRAD-FE 1-20 MG-MCG(24) PO TABS: 1.0000 | ORAL_TABLET | Freq: Every day | ORAL | 11 refills | Status: DC

## 2022-01-27 NOTE — Progress Notes (Signed)
Patient ID: JHADA RISK, female   DOB: Nov 10, 2000, 22 y.o.   MRN: [id number]  Chief Complaint  Patient presents with   Contraception    HPI Kim Crosby is a 22 y.o. female.  [id number] 12 weeks post 20 week termination by Dr. Burnett Kanaris in [place] after which there was question of possible retained POC afterward. She is doing well now and has had 2 period. She wants to restart Lo Loestrin HPI  Past Medical History:  Diagnosis Date   Anemia    Anxiety    Bipolar 2 disorder ([place])    Depression    Gestational diabetes    Medical history non-contributory    Retinitis     Past Surgical History:  Procedure Laterality Date   REFRACTIVE SURGERY Bilateral     Family History  Problem Relation Age of Onset   Gestational diabetes Mother        At younger age   Asthma Father    Diabetes Maternal Grandmother    Kidney disease Maternal Grandmother    Gestational diabetes Maternal Grandmother        When younger    Social History Social History   Tobacco Use   Smoking status: Never   Smokeless tobacco: Never  Vaping Use   Vaping Use: Never used  Substance Use Topics   Alcohol use: No   Drug use: Not Currently    Types: Marijuana    Allergies  Allergen Reactions   Pork-Derived Products Other (See Comments)    Life style preference Other reaction(s): Other (See Comments) Life style preference    Current Outpatient Medications  Medication Sig Dispense Refill   cephALEXin (KEFLEX) 250 MG capsule Take 1 capsule (250 mg total) by mouth 4 (four) times daily. 28 capsule 0   Norethindrone Acetate-Ethinyl Estrad-FE (LOESTRIN 24 FE) 1-20 MG-MCG(24) tablet Take 1 tablet by mouth daily. 28 tablet 11   acetaminophen (TYLENOL) 325 MG tablet Take 2 tablets (650 mg total) by mouth every 4 (four) hours as needed (for pain scale < 4). (Patient not taking: Reported on 11/08/2019)     amLODipine (NORVASC) 5 MG tablet Take 1 tablet (5 mg total) by mouth daily. (Patient not taking:  Reported on 01/18/2020) 30 tablet 0   Blood Glucose Monitoring Suppl (ACCU-CHEK NANO SMARTVIEW) w/Device KIT 1 kit by Subdermal route as directed. Check blood sugars for fasting, and two hours after breakfast, lunch and dinner (4 checks daily) (Patient not taking: Reported on 11/08/2019) 1 kit 0   ferrous sulfate 325 (65 FE) MG EC tablet Take 1 tablet (325 mg total) by mouth daily with breakfast. (Patient not taking: Reported on 11/08/2019) 45 tablet 3   No current facility-administered medications for this visit.    Review of Systems Review of Systems  Constitutional: Negative.   Respiratory: Negative.    Cardiovascular: Negative.   Gastrointestinal: Negative.   Genitourinary:  Positive for frequency.    Blood pressure 116/85, pulse 82, last menstrual period 04/10/2021, currently breastfeeding.  Physical Exam Physical Exam Vitals and nursing note reviewed. Exam conducted with a chaperone present.  Constitutional:      Appearance: Normal appearance.  Cardiovascular:     Rate and Rhythm: Normal rate.  Pulmonary:     Effort: Pulmonary effort is normal.  Abdominal:     General: Abdomen is flat.     Palpations: Abdomen is soft.  Neurological:     Mental Status: She is alert.  Psychiatric:        Mood  and Affect: Mood normal.        Behavior: Behavior normal.     Data Reviewed ED visit reviewed and [place] result  Assessment Abortion, therapeutic, second trimester - Plan: Norethindrone Acetate-Ethinyl Estrad-FE (LOESTRIN 24 FE) 1-20 MG-MCG(24) tablet, US PELVIC COMPLETE WITH TRANSVAGINAL  Oral contraception initial prescription - Plan: Norethindrone Acetate-Ethinyl Estrad-FE (LOESTRIN 24 FE) 1-20 MG-MCG(24) tablet  Urinary tract infection without hematuria, site unspecified - Plan: Urine Culture    Plan Pelvic US to reassure that no POC present Meds ordered this encounter  Medications   Norethindrone Acetate-Ethinyl Estrad-FE (LOESTRIN 24 FE) 1-20 MG-MCG(24) tablet    Sig:  Take 1 tablet by mouth daily.    Dispense:  28 tablet    Refill:  11       Emeterio Reeve 01/27/2022, 10:41 AM

## 2022-01-28 LAB — URINE CULTURE

## 2022-02-10 ENCOUNTER — Inpatient Hospital Stay: Admission: RE | Admit: 2022-02-10 | Payer: Medicaid Other | Source: Ambulatory Visit

## 2022-02-10 LAB — POCT URINALYSIS DIP (DEVICE)
Bilirubin Urine: NEGATIVE
Glucose, UA: NEGATIVE mg/dL
Hgb urine dipstick: NEGATIVE
Ketones, ur: NEGATIVE mg/dL
Nitrite: NEGATIVE
Protein, ur: NEGATIVE mg/dL
Specific Gravity, Urine: >=1.03 (ref 1.005–1.030)
Urobilinogen, UA: 0.2 mg/dL (ref 0.0–1.0)
pH: 6 (ref 5.0–8.0)

## 2022-02-23 DIAGNOSIS — F322 Major depressive disorder, single episode, severe without psychotic features: Secondary | ICD-10-CM | POA: Insufficient documentation

## 2022-03-16 DIAGNOSIS — R87610 Atypical squamous cells of undetermined significance on cytologic smear of cervix (ASC-US): Secondary | ICD-10-CM

## 2022-03-16 HISTORY — DX: Atypical squamous cells of undetermined significance on cytologic smear of cervix (ASC-US): R87.610

## 2022-03-16 NOTE — Progress Notes (Unsigned)
   GYNECOLOGY OFFICE VISIT NOTE  History:   Kim Crosby is a 22 y.o. [phone] here today for nexplanon insertion ***. She denies any abnormal vaginal discharge, bleeding, pelvic pain or other concerns.    Past Medical History:  Diagnosis Date   Anemia    Anxiety    Bipolar 2 disorder (HCC)    Depression    Gestational diabetes    Medical history non-contributory    Retinitis     Past Surgical History:  Procedure Laterality Date   REFRACTIVE SURGERY Bilateral     The following portions of the patient's history were reviewed and updated as appropriate: allergies, current medications, past family history, past medical history, past social history, past surgical history and problem list.   Health Maintenance:  pap-=- ASCUS HRHPV POS on 01/18/20.   Review of Systems:  Pertinent items noted in HPI and remainder of comprehensive ROS otherwise negative.  Physical Exam:  There were no vitals taken for this visit. CONSTITUTIONAL: Well-developed, well-nourished female in no acute distress.  HEENT:  Normocephalic, atraumatic. External right and left ear normal. No scleral icterus.  NECK: Normal range of motion, supple, no masses noted on observation SKIN: No rash noted. Not diaphoretic. No erythema. No pallor. MUSCULOSKELETAL: Normal range of motion. No edema noted. NEUROLOGIC: Alert and oriented to person, place, and time. Normal muscle tone coordination.  PSYCHIATRIC: Normal mood and affect. Normal behavior. Normal judgment and thought content. CARDIOVASCULAR: Normal heart rate noted RESPIRATORY: Effort and breath sounds normal, no problems with respiration noted ABDOMEN: No masses noted. No other overt distention noted.   PELVIC: {Blank single:19197::"Deferred","Normal appearing external genitalia; normal urethral meatus; normal appearing vaginal mucosa and cervix.  No abnormal discharge noted.  Normal uterine size, no other palpable masses, no uterine or adnexal tenderness. Performed  in the presence of a chaperone"}  Labs and Imaging No results found for this or any previous visit (from the past 168 hour(s)). No results found.    Assessment and Plan:      1. Encounter for counseling regarding contraception  2. Cervical cancer screening     Routine preventative health maintenance measures emphasized. Please refer to After Visit Summary for other counseling recommendations.   No follow-ups on file.     Myrtie Hawk, DO OB Fellow, Faculty [facility], Center for [facility] 03/16/2022 4:25 PM

## 2022-03-17 ENCOUNTER — Encounter: Payer: Medicaid Other | Admitting: Family Medicine

## 2022-03-17 DIAGNOSIS — Z124 Encounter for screening for malignant neoplasm of cervix: Secondary | ICD-10-CM

## 2022-03-17 DIAGNOSIS — Z3009 Encounter for other general counseling and advice on contraception: Secondary | ICD-10-CM

## 2022-03-17 DIAGNOSIS — R87610 Atypical squamous cells of undetermined significance on cytologic smear of cervix (ASC-US): Secondary | ICD-10-CM

## 2022-04-06 ENCOUNTER — Encounter (HOSPITAL_COMMUNITY): Payer: Self-pay | Admitting: Obstetrics & Gynecology

## 2022-04-06 ENCOUNTER — Inpatient Hospital Stay (HOSPITAL_COMMUNITY): Payer: Medicaid Other

## 2022-04-06 ENCOUNTER — Inpatient Hospital Stay (HOSPITAL_COMMUNITY)
Admission: AD | Admit: 2022-04-06 | Discharge: 2022-04-06 | Disposition: A | Discharge status: Home / Self Care | Payer: Medicaid Other | Attending: Obstetrics & Gynecology | Admitting: Obstetrics & Gynecology

## 2022-04-06 ENCOUNTER — Other Ambulatory Visit: Payer: Self-pay

## 2022-04-06 DIAGNOSIS — O99282 Endocrine, nutritional and metabolic diseases complicating pregnancy, second trimester: Secondary | ICD-10-CM | POA: Insufficient documentation

## 2022-04-06 DIAGNOSIS — R12 Heartburn: Secondary | ICD-10-CM | POA: Diagnosis not present

## 2022-04-06 DIAGNOSIS — O210 Mild hyperemesis gravidarum: Secondary | ICD-10-CM | POA: Diagnosis present

## 2022-04-06 DIAGNOSIS — Z3A01 Less than 8 weeks gestation of pregnancy: Secondary | ICD-10-CM

## 2022-04-06 DIAGNOSIS — Z9181 History of falling: Secondary | ICD-10-CM | POA: Diagnosis not present

## 2022-04-06 DIAGNOSIS — E860 Dehydration: Secondary | ICD-10-CM | POA: Diagnosis not present

## 2022-04-06 LAB — CBC
HCT: 35.1 % — ABNORMAL LOW (ref 36.0–46.0)
Hemoglobin: 11.3 g/dL — ABNORMAL LOW (ref 12.0–15.0)
MCH: 25.5 pg — ABNORMAL LOW (ref 26.0–34.0)
MCHC: 32.2 g/dL (ref 30.0–36.0)
MCV: 79.2 fL — ABNORMAL LOW (ref 80.0–100.0)
Platelets: 380 10*3/uL (ref 150–400)
RBC: 4.43 MIL/uL (ref 3.87–5.11)
RDW: 19 % — ABNORMAL HIGH (ref 11.5–15.5)
WBC: 10.5 10*3/uL (ref 4.0–10.5)
nRBC: 0 % (ref 0.0–0.2)

## 2022-04-06 LAB — POCT PREGNANCY, URINE: Preg Test, Ur: POSITIVE — AB

## 2022-04-06 LAB — HCG, QUANTITATIVE, PREGNANCY: hCG, Beta Chain, Quant, S: 49074 m[IU]/mL — ABNORMAL HIGH (ref <5)

## 2022-04-06 LAB — WET PREP, GENITAL
Sperm: NONE SEEN
Trich, Wet Prep: NONE SEEN
WBC, Wet Prep HPF POC: <10 (ref <10)
Yeast Wet Prep HPF POC: NONE SEEN

## 2022-04-06 LAB — URINALYSIS, ROUTINE W REFLEX MICROSCOPIC
Bilirubin Urine: NEGATIVE
Glucose, UA: NEGATIVE mg/dL
Hgb urine dipstick: NEGATIVE
Ketones, ur: NEGATIVE mg/dL
Leukocytes,Ua: NEGATIVE
Nitrite: NEGATIVE
Protein, ur: NEGATIVE mg/dL
Specific Gravity, Urine: 1.012 (ref 1.005–1.030)
pH: 6 (ref 5.0–8.0)

## 2022-04-06 MED ORDER — PROMETHAZINE HCL 25 MG PO TABS: 12.5000 mg | ORAL_TABLET | Freq: Four times a day (QID) | ORAL | 1 refills | Status: DC | PRN

## 2022-04-06 MED ORDER — SCOPOLAMINE 1 MG/3DAYS TD PT72: 1.0000 | MEDICATED_PATCH | TRANSDERMAL | 1 refills | Status: DC

## 2022-04-06 MED ORDER — FAMOTIDINE 20 MG PO TABS: 20.0000 mg | ORAL_TABLET | Freq: Every day | ORAL | 1 refills | Status: DC

## 2022-04-06 MED ORDER — SCOPOLAMINE 1 MG/3DAYS TD PT72
1.0000 | MEDICATED_PATCH | TRANSDERMAL | Status: DC
  Administered 2022-04-06: 1.5 mg via TRANSDERMAL
  Filled 2022-04-06: qty 1

## 2022-04-06 NOTE — Discharge Instructions (Signed)

## 2022-04-06 NOTE — MAU Note (Signed)
.  Kim Crosby is a 22 y.o. at Unknown here in MAU reporting: "I'm pregnant and I haven't been feeling well" - positive test last weekend. Lightheaded, nauseous. [place] before finding out she was pregnant "healed now" - worried if the fall hurt the pregnancy. Denies VB, abnormal discharge, or pain.   LMP: February 9th - approximate  Onset of complaint: 1-2 weeks  Pain score: 0 Vitals:   04/06/22 1936  BP: 129/69  Pulse: 95  Resp: 19  Temp: 98.8 F (37.1 C)  SpO2: 100%     FHT:NA Lab orders placed from triage: urine pregnancy

## 2022-04-06 NOTE — MAU Provider Note (Signed)
History     CSN: [id number]  Arrival date and time: 04/06/22 1912   Event Date/Time   First Provider Initiated Contact with Patient 04/06/22 2142      Chief Complaint  Patient presents with   Nausea   22 y.o. [id number] @[redacted]w[redacted]d  by LMP presenting with nausea, lightheaded and LAP. Reports onset of sx a few days ago. Denies vomiting. Reports frequent heartburn. Denies syncope. LAP is intermittent cramping in her RLQ. Has not treated it. Also reports tripping and falling hard last week and concerned it may have harmed her pregnancy. Denies VB.    OB History     Gravida  4   Para  1   Term  1   Preterm      AB  2   Living  1      SAB  1   IAB  1   Ectopic      Multiple  0   Live Births  1           Past Medical History:  Diagnosis Date   Anemia    Anxiety    Bipolar 2 disorder ([place])    Depression    Gestational diabetes    Medical history non-contributory    Retinitis     Past Surgical History:  Procedure Laterality Date   REFRACTIVE SURGERY Bilateral     Family History  Problem Relation Age of Onset   Gestational diabetes Mother        At younger age   Asthma Father    Diabetes Maternal Grandmother    Kidney disease Maternal Grandmother    Gestational diabetes Maternal Grandmother        When younger    Social History   Tobacco Use   Smoking status: Never   Smokeless tobacco: Never  Vaping Use   Vaping Use: Never used  Substance Use Topics   Alcohol use: No   Drug use: Not Currently    Types: Marijuana    Allergies:  Allergies  Allergen Reactions   Pork-Derived Products Other (See Comments)    Life style preference Other reaction(s): Other (See Comments) Life style preference    Medications Prior to Admission  Medication Sig Dispense Refill Last Dose   acetaminophen (TYLENOL) 325 MG tablet Take 2 tablets (650 mg total) by mouth every 4 (four) hours as needed (for pain scale < 4). (Patient not taking: Reported on 11/08/2019)       amLODipine (NORVASC) 5 MG tablet Take 1 tablet (5 mg total) by mouth daily. (Patient not taking: Reported on 01/18/2020) 30 tablet 0    Blood Glucose Monitoring Suppl (ACCU-CHEK NANO SMARTVIEW) w/Device KIT 1 kit by Subdermal route as directed. Check blood sugars for fasting, and two hours after breakfast, lunch and dinner (4 checks daily) (Patient not taking: Reported on 11/08/2019) 1 kit 0    cephALEXin (KEFLEX) 250 MG capsule Take 1 capsule (250 mg total) by mouth 4 (four) times daily. 28 capsule 0    ferrous sulfate 325 (65 FE) MG EC tablet Take 1 tablet (325 mg total) by mouth daily with breakfast. (Patient not taking: Reported on 11/08/2019) 45 tablet 3    Norethindrone Acetate-Ethinyl Estrad-FE (LOESTRIN 24 FE) 1-20 MG-MCG(24) tablet Take 1 tablet by mouth daily. 28 tablet 11     Review of Systems  Gastrointestinal:  Positive for abdominal pain and nausea. Negative for vomiting.  Genitourinary:  Negative for vaginal bleeding.  Neurological:  Positive for dizziness. Negative for syncope.  Physical Exam   Blood pressure 129/69, pulse 95, temperature 98.4 F (36.9 C), resp. rate 20, height 5' 3.5" (1.613 m), weight 101.9 kg, last menstrual period 02/21/2022, SpO2 99 %, currently breastfeeding.  Physical Exam Vitals and nursing note reviewed.  Constitutional:      Appearance: Normal appearance.  HENT:     Head: Normocephalic and atraumatic.  Pulmonary:     Effort: Pulmonary effort is normal. No respiratory distress.  Musculoskeletal:        General: Normal range of motion.     Cervical back: Normal range of motion.  Neurological:     General: No focal deficit present.     Mental Status: She is alert and oriented to person, place, and time.  Psychiatric:        Mood and Affect: Mood is anxious.        Behavior: Behavior normal.    Results for orders placed or performed during the hospital encounter of 04/06/22 (from the past 24 hour(s))  Urinalysis, Routine w reflex  microscopic -Urine, Clean Catch     Status: None   Collection Time: 04/06/22  7:31 PM  Result Value Ref Range   Color, Urine YELLOW YELLOW   APPearance CLEAR CLEAR   Specific Gravity, Urine 1.012 1.005 - 1.030   pH 6.0 5.0 - 8.0   Glucose, UA NEGATIVE NEGATIVE mg/dL   Hgb urine dipstick NEGATIVE NEGATIVE   Bilirubin Urine NEGATIVE NEGATIVE   Ketones, ur NEGATIVE NEGATIVE mg/dL   Protein, ur NEGATIVE NEGATIVE mg/dL   Nitrite NEGATIVE NEGATIVE   Leukocytes,Ua NEGATIVE NEGATIVE  Pregnancy, urine POC     Status: Abnormal   Collection Time: 04/06/22  7:44 PM  Result Value Ref Range   Preg Test, Ur POSITIVE (A) NEGATIVE  CBC     Status: Abnormal   Collection Time: 04/06/22  9:00 PM  Result Value Ref Range   WBC 10.5 4.0 - 10.5 K/uL   RBC 4.43 3.87 - 5.11 MIL/uL   Hemoglobin 11.3 (L) 12.0 - 15.0 g/dL   HCT 35.1 (L) 36.0 - 46.0 %   MCV 79.2 (L) 80.0 - 100.0 fL   MCH 25.5 (L) 26.0 - 34.0 pg   MCHC 32.2 30.0 - 36.0 g/dL   RDW 19.0 (H) 11.5 - 15.5 %   Platelets 380 150 - 400 K/uL   nRBC 0.0 0.0 - 0.2 %  hCG, quantitative, pregnancy     Status: Abnormal   Collection Time: 04/06/22  9:00 PM  Result Value Ref Range   hCG, Beta Chain, Quant, S 49,074 (H) <5 mIU/mL  Wet prep, genital     Status: Abnormal   Collection Time: 04/06/22  9:52 PM   Specimen: PATH Cytology Cervicovaginal Ancillary Only  Result Value Ref Range   Yeast Wet Prep HPF POC NONE SEEN NONE SEEN   Trich, Wet Prep NONE SEEN NONE SEEN   Clue Cells Wet Prep HPF POC PRESENT (A) NONE SEEN   WBC, Wet Prep HPF POC <10 <10   Sperm NONE SEEN    US OB Comp Less 14 Wks  Result Date: 04/06/2022 CLINICAL DATA:  Abdominal cramping. LMP: 02/21/2022 corresponding to an estimated gestational age of [redacted] weeks, 2 days. EXAM: OBSTETRIC <14 WK ULTRASOUND TECHNIQUE: Transabdominal ultrasound was performed for evaluation of the gestation as well as the maternal uterus and adnexal regions. COMPARISON:  None Available. FINDINGS: Intrauterine  gestational sac: Single intrauterine gestational sac. Yolk sac:  Seen Embryo:  May present Cardiac Activity: Detected Heart  Rate: 129 bpm CRL:   8 mm   6 w 5  d                  [place] EDC: 11/25/2022 Subchorionic hemorrhage:  None visualized. Maternal uterus/adnexae: The maternal ovaries are unremarkable. There is a corpus luteum in the left ovary. IMPRESSION: Single live intrauterine pregnancy with an estimated gestational age of [redacted] weeks, 5 days. Electronically Signed   By: Anner Crete M.D.   On: 04/06/2022 22:35    MAU Course  Procedures Scopolamine  MDM Labs and [place] ordered and reviewed. No signs of acute process. Viable IUP on [place]. Will treat reflux and morning sickness. Stable for discharge home.   Assessment and Plan   1. [redacted] weeks gestation of pregnancy   2. Morning sickness   3. Dehydration    Discharge home Follow up at [facility] to start care Rx Pepcid Rx Phenergan SAB precautions  Allergies as of 04/06/2022       Reactions   Pork-derived Products Other (See Comments)   Life style preference Other reaction(s): Other (See Comments) Life style preference        Medication List     STOP taking these medications    Accu-Chek Nano SmartView w/Device Kit   amLODipine 5 MG tablet Commonly known as: NORVASC   cephALEXin 250 MG capsule Commonly known as: KEFLEX   ferrous sulfate 325 (65 FE) MG EC tablet   Norethindrone Acetate-Ethinyl Estrad-FE 1-20 MG-MCG(24) tablet Commonly known as: LOESTRIN 24 FE       TAKE these medications    acetaminophen 325 MG tablet Commonly known as: Tylenol Take 2 tablets (650 mg total) by mouth every 4 (four) hours as needed (for pain scale < 4).   famotidine 20 MG tablet Commonly known as: PEPCID Take 1 tablet (20 mg total) by mouth at bedtime.   promethazine 25 MG tablet Commonly known as: PHENERGAN Take 0.5-1 tablets (12.5-25 mg total) by mouth every 6 (six) hours as needed for nausea or vomiting.   scopolamine 1  MG/3DAYS Commonly known as: TRANSDERM-SCOP Place 1 patch (1.5 mg total) onto the skin every 3 (three) days. Start taking on: April 09, 2022        Julianne Handler, [place] 04/06/2022, 10:55 PM

## 2022-04-07 LAB — GC/CHLAMYDIA PROBE AMP (~~LOC~~) NOT AT ARMC
Chlamydia: NEGATIVE
Comment: NEGATIVE
Comment: NORMAL
Neisseria Gonorrhea: NEGATIVE

## 2022-04-24 ENCOUNTER — Ambulatory Visit: Payer: Medicaid Other | Admitting: Obstetrics and Gynecology

## 2022-07-27 ENCOUNTER — Emergency Department (HOSPITAL_COMMUNITY)
Admission: EM | Admit: 2022-07-27 | Discharge: 2022-07-28 | Discharge status: Against Medical Advice | Payer: MEDICAID | Attending: Emergency Medicine | Admitting: Emergency Medicine

## 2022-07-27 ENCOUNTER — Encounter (HOSPITAL_COMMUNITY): Payer: Self-pay | Admitting: Emergency Medicine

## 2022-07-27 ENCOUNTER — Other Ambulatory Visit: Payer: Self-pay

## 2022-07-27 DIAGNOSIS — Z5321 Procedure and treatment not carried out due to patient leaving prior to being seen by health care provider: Secondary | ICD-10-CM | POA: Insufficient documentation

## 2022-07-27 DIAGNOSIS — H43391 Other vitreous opacities, right eye: Secondary | ICD-10-CM | POA: Insufficient documentation

## 2022-07-27 NOTE — ED Triage Notes (Addendum)
Pt c/o right eye floaters intermittently since April. 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 Denies pain, states vision is the same.

## 2022-07-28 ENCOUNTER — Encounter: Payer: Self-pay | Admitting: Obstetrics and Gynecology

## 2022-07-28 ENCOUNTER — Other Ambulatory Visit (HOSPITAL_COMMUNITY)
Admission: RE | Admit: 2022-07-28 | Discharge: 2022-07-28 | Disposition: A | Discharge status: Home / Self Care | Payer: MEDICAID | Source: Ambulatory Visit | Attending: Obstetrics and Gynecology | Admitting: Obstetrics and Gynecology

## 2022-07-28 ENCOUNTER — Ambulatory Visit (INDEPENDENT_AMBULATORY_CARE_PROVIDER_SITE_OTHER): Payer: No Typology Code available for payment source | Admitting: Obstetrics and Gynecology

## 2022-07-28 VITALS — BP 101/68 | HR 73 | Wt 218.4 lb

## 2022-07-28 DIAGNOSIS — Z113 Encounter for screening for infections with a predominantly sexual mode of transmission: Secondary | ICD-10-CM | POA: Diagnosis not present

## 2022-07-28 DIAGNOSIS — Z1331 Encounter for screening for depression: Secondary | ICD-10-CM | POA: Diagnosis not present

## 2022-07-28 DIAGNOSIS — Z124 Encounter for screening for malignant neoplasm of cervix: Secondary | ICD-10-CM

## 2022-07-28 DIAGNOSIS — Z30017 Encounter for initial prescription of implantable subdermal contraceptive: Secondary | ICD-10-CM

## 2022-07-28 DIAGNOSIS — N898 Other specified noninflammatory disorders of vagina: Secondary | ICD-10-CM | POA: Diagnosis not present

## 2022-07-28 DIAGNOSIS — F322 Major depressive disorder, single episode, severe without psychotic features: Secondary | ICD-10-CM

## 2022-07-28 DIAGNOSIS — R6882 Decreased libido: Secondary | ICD-10-CM

## 2022-07-28 LAB — POCT PREGNANCY, URINE: Preg Test, Ur: NEGATIVE

## 2022-07-28 MED ORDER — ETONOGESTREL 68 MG ~~LOC~~ IMPL
68.0000 mg | DRUG_IMPLANT | Freq: Once | SUBCUTANEOUS | Status: AC
Start: 2022-07-28 — End: 2022-07-28
  Administered 2022-07-28: 68 mg via SUBCUTANEOUS

## 2022-07-28 NOTE — Progress Notes (Signed)
GYNECOLOGY VISIT  Patient name: Kim Crosby MRN [id number]  Date of birth: 2000/03/02 Chief Complaint:   Abnormal Pap Smear and Contraception  History:  Kim Crosby is a 22 y.o. [phone] being seen today for nexplanon and pap smear. Has had unintended pregnancies w/ subsequent TABS.  Has had nexplanon before with initial irregular bleeding then amenorrhea  Also had abnormal pap in 2021 and did not have repeat thereafter  Thinks her last menses was about 2 weeks ago and had intercourse maybe 4-5 days ago, unprotected with coitus interruptus method.   At close of visit notes having decreased libido. Not currently on depression medications and does not currently have a therapist. Notes having life stressors including financial hardship. Has not had interest in sex more recently and not sure why.   Past Medical History:  Diagnosis Date   Anemia    Anxiety    Bipolar 2 disorder (HCC)    Depression    Gestational diabetes    Medical history non-contributory    Retinitis     Past Surgical History:  Procedure Laterality Date   REFRACTIVE SURGERY Bilateral     The following portions of the patient's history were reviewed and updated as appropriate: allergies, current medications, past family history, past medical history, past social history, past surgical history and problem list.   Health Maintenance:   Last pap     Component Value Date/Time   DIAGPAP (A) 01/18/2020 1308    - Atypical squamous cells of undetermined significance (ASC-US)   HPVHIGH Positive (A) 01/18/2020 0852   ADEQPAP  01/18/2020 0852    Satisfactory for evaluation; transformation zone component PRESENT.    High Risk HPV: Positive  Adequacy:  Satisfactory for evaluation, transformation zone component PRESENT  Diagnosis:  Atypical squamous cells of undetermined significance (ASC-US)  Last mammogram: n/a   Review of Systems:  Pertinent items are noted in HPI. Comprehensive review of systems was  otherwise negative.   Objective:  Physical Exam BP 101/68   Pulse 73   Wt 218 lb 6.4 oz (99.1 kg)   LMP  (LMP Unknown)   BMI 38.08 kg/m    Physical Exam Vitals and nursing note reviewed. Exam conducted with a chaperone present.  Constitutional:      Appearance: Normal appearance.  HENT:     Head: Normocephalic and atraumatic.  Pulmonary:     Effort: Pulmonary effort is normal.     Breath sounds: Normal breath sounds.  Genitourinary:    General: Normal vulva.     Exam position: Lithotomy position.     Vagina: Normal.     Cervix: Normal.  Skin:    General: Skin is warm and dry.  Neurological:     General: No focal deficit present.     Mental Status: She is alert.  Psychiatric:        Mood and Affect: Mood normal.        Behavior: Behavior normal.        Thought Content: Thought content normal.        Judgment: Judgment normal.     Nexplanon Insertion Procedure Patient identified, informed consent performed, consent signed.   Patient does understand that irregular bleeding is a very common side effect of this medication. She was advised to have backup contraception for one week after placement. Pregnancy test in clinic today was negative.  Appropriate time out taken.  Patient's left arm was prepped and draped in the usual sterile fashion. The area  of insertion was noted in the upper arm.  Patient was prepped with alcohol swab and then injected with 3 ml of 1% lidocaine.  She was prepped with betadine, Nexplanon removed from packaging,  Device confirmed in needle, then inserted full length of needle and withdrawn per handbook instructions. Nexplanon was able to palpated in the patient's arm; patient  palpated the insert herself. There was minimal blood loss.  Patient insertion site covered with guaze and a pressure bandage to reduce any bruising.  The patient tolerated the procedure well and was given post procedure instructions.       Assessment & Plan:   1. Screening for  cervical cancer Pap collected given no follow up after last abnormal pap  - Cytology - PAP - Cervicovaginal ancillary only - RPR - HIV Antibody (routine testing w rflx) - Hepatitis B Surface AntiGEN - Hepatitis C Antibody - etonogestrel (NEXPLANON) implant 68 mg  2. Routine screening for STI (sexually transmitted infection)  - Cytology - PAP - Cervicovaginal ancillary only - RPR - HIV Antibody (routine testing w rflx) - Hepatitis B Surface AntiGEN - Hepatitis C Antibody - etonogestrel (NEXPLANON) implant 68 mg  3. Nexplanon insertion Now s/p uncomplicated nexplanon insertion  - etonogestrel (NEXPLANON) implant 68 mg  4. Vaginal discharge - Cytology - PAP - Cervicovaginal ancillary only - RPR - HIV Antibody (routine testing w rflx) - Hepatitis B Surface AntiGEN - Hepatitis C Antibody - etonogestrel (NEXPLANON) implant 68 mg  5. Positive depression screening  - Amb ref to [facility]  6. MDD (major depressive disorder), severe (HCC) - Amb ref to [facility]  7. Low libido - Amb ref to [facility]    Lorriane Shire, MD Minimally Invasive Gynecologic Surgery Center for [facility], [facility]

## 2022-07-28 NOTE — ED Notes (Signed)
Pt name called for room, no response 

## 2022-07-29 LAB — RPR: RPR Ser Ql: NONREACTIVE

## 2022-07-29 LAB — HIV ANTIBODY (ROUTINE TESTING W REFLEX): HIV Screen 4th Generation wRfx: NONREACTIVE

## 2022-07-29 LAB — HEPATITIS C ANTIBODY: Hep C Virus Ab: NONREACTIVE

## 2022-07-29 LAB — HEPATITIS B SURFACE ANTIGEN: Hepatitis B Surface Ag: NEGATIVE

## 2022-07-30 ENCOUNTER — Telehealth: Payer: Self-pay | Admitting: General Practice

## 2022-07-30 LAB — CERVICOVAGINAL ANCILLARY ONLY
Bacterial Vaginitis (gardnerella): POSITIVE — AB
Candida Glabrata: NEGATIVE
Candida Vaginitis: NEGATIVE
Chlamydia: NEGATIVE
Comment: NEGATIVE
Comment: NEGATIVE
Comment: NEGATIVE
Comment: NEGATIVE
Comment: NEGATIVE
Comment: NORMAL
Neisseria Gonorrhea: NEGATIVE
Trichomonas: NEGATIVE

## 2022-07-30 NOTE — Telephone Encounter (Signed)
Patient called into front office stating she had nexplanon inserted on Monday. She stated she felt fine in the office but once she got home she noticed the bandage was too tight so she took it off. She noticed a lot of bruising and soreness. Patient states the next morning it was still really bruised, sore, & her shoulder was even hurting. She took a dose of tylenol which helped some. Patient reports this morning it is a little better and currently rates pain at a 4. Recommended she try ibuprofen or aleve and if she isn't feeling better to give [place] a call back this afternoon around 1 and we can bring her in to be seen. Patient verbalized understanding.

## 2022-07-31 LAB — CYTOLOGY - PAP: Diagnosis: NEGATIVE

## 2022-08-01 ENCOUNTER — Other Ambulatory Visit: Payer: Self-pay | Admitting: Obstetrics and Gynecology

## 2022-08-01 DIAGNOSIS — B9689 Other specified bacterial agents as the cause of diseases classified elsewhere: Secondary | ICD-10-CM

## 2022-08-01 MED ORDER — METRONIDAZOLE 500 MG PO TABS
500.0000 mg | ORAL_TABLET | Freq: Two times a day (BID) | ORAL | 0 refills | Status: AC
Start: 2022-08-01 — End: 2022-08-08

## 2022-08-07 ENCOUNTER — Ambulatory Visit: Payer: MEDICAID

## 2022-09-08 ENCOUNTER — Ambulatory Visit (INDEPENDENT_AMBULATORY_CARE_PROVIDER_SITE_OTHER): Payer: No Typology Code available for payment source | Admitting: *Deleted

## 2022-09-08 VITALS — BP 123/66 | HR 86 | Ht 63.5 in | Wt 223.1 lb

## 2022-09-08 DIAGNOSIS — Z3A01 Less than 8 weeks gestation of pregnancy: Secondary | ICD-10-CM

## 2022-09-08 DIAGNOSIS — Z3490 Encounter for supervision of normal pregnancy, unspecified, unspecified trimester: Secondary | ICD-10-CM

## 2022-09-08 DIAGNOSIS — Z3A00 Weeks of gestation of pregnancy not specified: Secondary | ICD-10-CM

## 2022-09-08 DIAGNOSIS — Z3201 Encounter for pregnancy test, result positive: Secondary | ICD-10-CM

## 2022-09-08 DIAGNOSIS — Z3200 Encounter for pregnancy test, result unknown: Secondary | ICD-10-CM

## 2022-09-08 DIAGNOSIS — Z3687 Encounter for antenatal screening for uncertain dates: Secondary | ICD-10-CM

## 2022-09-08 DIAGNOSIS — O219 Vomiting of pregnancy, unspecified: Secondary | ICD-10-CM

## 2022-09-08 LAB — POCT PREGNANCY, URINE: Preg Test, Ur: POSITIVE — AB

## 2022-09-08 MED ORDER — PROMETHAZINE HCL 25 MG PO TABS
25.0000 mg | ORAL_TABLET | Freq: Four times a day (QID) | ORAL | 0 refills | Status: DC | PRN
Start: 2022-09-08 — End: 2022-10-09

## 2022-09-08 MED ORDER — PRENATAL 27-1 MG PO TABS
1.0000 | ORAL_TABLET | Freq: Every day | ORAL | 11 refills | Status: DC
Start: 2022-09-08 — End: 2023-03-30

## 2022-09-08 NOTE — Progress Notes (Signed)
Patient dropped off urine for pregnancy test which was positive. I called [name] and left a  message I am calling with results and will call again in a few minutes, please be available to answer my call. Kim Crosby Patient was still in office because she was waiting for her ride. I informed her upt was pregnant. She states she is not sure when LMP was. She also informed me she has a nexplanon. I discussed with Dr Donavan Foil and he states nexplanon can be removed at new ob visit. I informed patient of this. I also informed her we recommend dating [place], scheduled for 09/10/22. I also advised she start PNV. She asked for PNV and nausea med prescriptions. Phenergan RX  and PNV sent in per protocol. Kim Crosby

## 2022-09-10 ENCOUNTER — Other Ambulatory Visit: Payer: MEDICAID

## 2022-09-11 ENCOUNTER — Ambulatory Visit: Payer: MEDICAID

## 2022-09-11 ENCOUNTER — Other Ambulatory Visit: Payer: Self-pay

## 2022-09-16 ENCOUNTER — Other Ambulatory Visit: Payer: Self-pay

## 2022-09-17 ENCOUNTER — Ambulatory Visit (INDEPENDENT_AMBULATORY_CARE_PROVIDER_SITE_OTHER): Payer: No Typology Code available for payment source

## 2022-09-17 ENCOUNTER — Other Ambulatory Visit: Payer: Self-pay

## 2022-09-17 DIAGNOSIS — Z3A09 9 weeks gestation of pregnancy: Secondary | ICD-10-CM

## 2022-09-17 DIAGNOSIS — Z3687 Encounter for antenatal screening for uncertain dates: Secondary | ICD-10-CM

## 2022-10-02 ENCOUNTER — Telehealth: Payer: MEDICAID

## 2022-10-02 DIAGNOSIS — Z3689 Encounter for other specified antenatal screening: Secondary | ICD-10-CM

## 2022-10-02 DIAGNOSIS — Z3480 Encounter for supervision of other normal pregnancy, unspecified trimester: Secondary | ICD-10-CM | POA: Insufficient documentation

## 2022-10-02 NOTE — Progress Notes (Signed)
New OB Intake  I connected with Kim Crosby  on 10/02/22 at 10:15 AM EDT by MyChart Video Visit and verified that I am speaking with the correct person using two identifiers. Nurse is located at [facility] and pt is located at home.  I discussed the limitations, risks, security and privacy concerns of performing an evaluation and management service by telephone and the availability of in person appointments. I also discussed with the patient that there may be a patient responsible charge related to this service. The patient expressed understanding and agreed to proceed.  I explained I am completing New OB Intake today. We discussed EDD of Not found.. Pt is [id number]. I reviewed her allergies, medications and Medical/Surgical/OB history.    Patient Active Problem List   Diagnosis Date Noted   Supervision of other normal pregnancy, antepartum 10/02/2022   ASCUS with positive high risk HPV cervical 03/16/2022   MDD (major depressive disorder), severe (HCC) 02/23/2022   Excess weight gain in pregnancy, second trimester 07/27/2019    Concerns addressed today  Delivery Plans Plans to deliver at [facility]. Discussed the nature of our practice with multiple providers including residents and students. Due to the size of the practice, the delivering provider may not be the same as those providing prenatal care.   Patient is not interested in water birth. Offered upcoming OB visit with CNM to discuss further.  MyChart/Babyscripts MyChart access verified. I explained pt will have some visits in office and some virtually. Babyscripts instructions given and order placed. Patient verifies receipt of registration text/e-mail. Account successfully created and app downloaded.  Blood Pressure Cuff/Weight Scale Patient has private insurance; instructed to purchase blood pressure cuff and bring to first prenatal appt. Explained after first prenatal appt pt will check weekly and document in Babyscripts. Patient  does have weight scale.  Anatomy US Explained first scheduled [place] will be around 19 weeks. Anatomy US scheduled for 11/24/22 at 12:15p.  Is patient a CenteringPregnancy candidate?  Accepted   If accepted,    Is patient a Mom+Baby Combined Care candidate?  Not a candidate   If accepted, confirm patient does not intend to move from the area for at least 12 months, then notify Mom+Baby staff  Interested in [place]? If yes, send referral and doula dot phrase.   Is patient a candidate for Babyscripts Optimization? No - Centering  First visit review I reviewed new OB appt with patient. Explained pt will be seen by Dr. Alysia Penna at first visit. Discussed Avelina Laine genetic screening with patient. needs Panorama Only.. Routine prenatal labs  needed at [facility] OB visit.    Last Pap Diagnosis  Date Value Ref Range Status  07/28/2022   Final   - Negative for intraepithelial lesion or malignancy (NILM)    Henrietta Dine, CMA 10/02/2022  11:04 AM

## 2022-10-09 ENCOUNTER — Encounter: Payer: Self-pay | Admitting: Obstetrics and Gynecology

## 2022-10-09 ENCOUNTER — Ambulatory Visit (INDEPENDENT_AMBULATORY_CARE_PROVIDER_SITE_OTHER): Payer: MEDICAID | Admitting: Obstetrics and Gynecology

## 2022-10-09 ENCOUNTER — Other Ambulatory Visit: Payer: Self-pay

## 2022-10-09 ENCOUNTER — Other Ambulatory Visit (HOSPITAL_COMMUNITY)
Admission: RE | Admit: 2022-10-09 | Discharge: 2022-10-09 | Disposition: A | Discharge status: Home / Self Care | Payer: No Typology Code available for payment source | Source: Ambulatory Visit | Attending: Obstetrics and Gynecology | Admitting: Obstetrics and Gynecology

## 2022-10-09 VITALS — BP 131/82 | HR 84 | Wt 227.3 lb

## 2022-10-09 DIAGNOSIS — Z3A12 12 weeks gestation of pregnancy: Secondary | ICD-10-CM | POA: Insufficient documentation

## 2022-10-09 DIAGNOSIS — Z3481 Encounter for supervision of other normal pregnancy, first trimester: Secondary | ICD-10-CM

## 2022-10-09 DIAGNOSIS — O98811 Other maternal infectious and parasitic diseases complicating pregnancy, first trimester: Secondary | ICD-10-CM | POA: Insufficient documentation

## 2022-10-09 DIAGNOSIS — B3731 Acute candidiasis of vulva and vagina: Secondary | ICD-10-CM | POA: Insufficient documentation

## 2022-10-09 DIAGNOSIS — Z3480 Encounter for supervision of other normal pregnancy, unspecified trimester: Secondary | ICD-10-CM | POA: Diagnosis present

## 2022-10-09 DIAGNOSIS — Z3046 Encounter for surveillance of implantable subdermal contraceptive: Secondary | ICD-10-CM

## 2022-10-09 DIAGNOSIS — Z8632 Personal history of gestational diabetes: Secondary | ICD-10-CM | POA: Diagnosis not present

## 2022-10-09 NOTE — Progress Notes (Signed)
Subjective:  Kim Crosby is a 22 y.o. [phone] at [redacted]w[redacted]d being seen today for her first OB visit. EDD by first trimester U/S. H/O Gestation DM ( diet control) Has Implanon in place.  She is currently monitored for the following issues for this low-risk pregnancy and has MDD (major depressive disorder), severe (HCC); Supervision of other normal pregnancy, antepartum; and History of gestational diabetes on their problem list.  Patient reports no complaints.  Contractions: Not present. Vag. Bleeding: None.  Movement: Absent. Denies leaking of fluid.   The following portions of the patient's history were reviewed and updated as appropriate: allergies, current medications, past family history, past medical history, past social history, past surgical history and problem list. Problem list updated.  Objective:   Vitals:   10/09/22 1452  BP: 131/82  Pulse: 84  Weight: 227 lb 4.8 oz (103.1 kg)    Fetal Status: Fetal Heart Rate (bpm): 161   Movement: Absent     General:  Alert, oriented and cooperative. Patient is in no acute distress.  Skin: Skin is warm and dry. No rash noted.   Cardiovascular: Normal heart rate noted  Respiratory: Normal respiratory effort, no problems with respiration noted  Abdomen: Soft, gravid, appropriate for gestational age. Pain/Pressure: Present     Pelvic:  Cervical exam deferred        Extremities: Normal range of motion.  Edema: None  Mental Status: Normal mood and affect. Normal behavior. Normal judgment and thought content.   Urinalysis:      Assessment and Plan:  Pregnancy: G5P1031 at [redacted]w[redacted]d  1. Supervision of other normal pregnancy, antepartum Prenatal labs and care reviewed with pt Genetic testing discussed - Culture, OB Urine - CBC/D/Plt+RPR+Rh+ABO+RubIgG... - Hemoglobin A1c - Protein / creatinine ratio, urine - TSH - Comprehensive metabolic panel - PANORAMA PRENATAL TEST - Cervicovaginal ancillary only( )  2. History of gestational  diabetes A1c today  Implanon removed. See separate procedure note  Preterm labor symptoms and general obstetric precautions including but not limited to vaginal bleeding, contractions, leaking of fluid and fetal movement were reviewed in detail with the patient. Please refer to After Visit Summary for other counseling recommendations.  Return in about 4 weeks (around 11/06/2022) for OB visit, face to face, any provider.   Hermina Staggers, MD

## 2022-10-09 NOTE — Progress Notes (Signed)
Patient ID: [name], female   DOB: 15-Aug-2000, 22 y.o.   MRN: [id number]     GYNECOLOGY CLINIC PROCEDURE NOTE  Kim Crosby is a 22 y.o. [phone] here for Nexplanon removal   Nexplanon Removal Patient identified, informed consent performed, consent signed.   Appropriate time out taken. Nexplanon site identified.  Area prepped in usual sterile fashon. One ml of 1% lidocaine was used to anesthetize the area at the distal end of the implant. A small stab incision was made right beside the implant on the distal portion.  The Nexplanon rod was grasped using hemostats and removed without difficulty.  There was minimal blood loss. There were no complications.  3 ml of 1% lidocaine was injected around the incision for post-procedure analgesia.  Steri-strips were applied over the small incision.  A pressure bandage was applied to reduce any bruising.  The patient tolerated the procedure well and was given post procedure instructions.     [name], MD Attending Obstetrician & Gynecologist Center for [facility], [facility]

## 2022-10-10 LAB — COMPREHENSIVE METABOLIC PANEL
ALT: 17 [IU]/L (ref 0–32)
AST: 16 [IU]/L (ref 0–40)
Albumin: 3.8 g/dL — ABNORMAL LOW (ref 4.0–5.0)
Alkaline Phosphatase: 53 [IU]/L (ref 44–121)
BUN/Creatinine Ratio: 17 (ref 9–23)
BUN: 9 mg/dL (ref 6–20)
Bilirubin Total: <0.2 mg/dL (ref 0.0–1.2)
CO2: 20 mmol/L (ref 20–29)
Calcium: 9.7 mg/dL (ref 8.7–10.2)
Chloride: 104 mmol/L (ref 96–106)
Creatinine, Ser: 0.52 mg/dL — ABNORMAL LOW (ref 0.57–1.00)
Globulin, Total: 2.6 g/dL (ref 1.5–4.5)
Glucose: 81 mg/dL (ref 70–99)
Potassium: 4.2 mmol/L (ref 3.5–5.2)
Sodium: 137 mmol/L (ref 134–144)
Total Protein: 6.4 g/dL (ref 6.0–8.5)
eGFR: 133 mL/min/{1.73_m2} (ref >59)

## 2022-10-10 LAB — CBC/D/PLT+RPR+RH+ABO+RUBIGG...
Antibody Screen: NEGATIVE
Basophils Absolute: 0 10*3/uL (ref 0.0–0.2)
Basos: 0 %
EOS (ABSOLUTE): 0.1 10*3/uL (ref 0.0–0.4)
Eos: 1 %
HCV Ab: NONREACTIVE
HIV Screen 4th Generation wRfx: NONREACTIVE
Hematocrit: 35.5 % (ref 34.0–46.6)
Hemoglobin: 11.2 g/dL (ref 11.1–15.9)
Hepatitis B Surface Ag: NEGATIVE
Immature Grans (Abs): 0.1 10*3/uL (ref 0.0–0.1)
Immature Granulocytes: 1 %
Lymphocytes Absolute: 2.9 10*3/uL (ref 0.7–3.1)
Lymphs: 29 %
MCH: 25.1 pg — ABNORMAL LOW (ref 26.6–33.0)
MCHC: 31.5 g/dL (ref 31.5–35.7)
MCV: 80 fL (ref 79–97)
Monocytes Absolute: 0.7 10*3/uL (ref 0.1–0.9)
Monocytes: 7 %
Neutrophils Absolute: 6.3 10*3/uL (ref 1.4–7.0)
Neutrophils: 62 %
Platelets: 366 10*3/uL (ref 150–450)
RBC: 4.46 x10E6/uL (ref 3.77–5.28)
RDW: 19.4 % — ABNORMAL HIGH (ref 11.7–15.4)
RPR Ser Ql: NONREACTIVE
Rh Factor: POSITIVE
Rubella Antibodies, IGG: 6.16 {index} (ref >0.99)
WBC: 10.1 10*3/uL (ref 3.4–10.8)

## 2022-10-10 LAB — CERVICOVAGINAL ANCILLARY ONLY
Bacterial Vaginitis (gardnerella): NEGATIVE
Candida Glabrata: NEGATIVE
Candida Vaginitis: POSITIVE — AB
Chlamydia: NEGATIVE
Comment: NEGATIVE
Comment: NEGATIVE
Comment: NEGATIVE
Comment: NEGATIVE
Comment: NEGATIVE
Comment: NORMAL
Neisseria Gonorrhea: NEGATIVE
Trichomonas: NEGATIVE

## 2022-10-10 LAB — PROTEIN / CREATININE RATIO, URINE
Creatinine, Urine: 98.9 mg/dL
Protein, Ur: 8.6 mg/dL
Protein/Creat Ratio: 87 mg/g{creat} (ref 0–200)

## 2022-10-10 LAB — TSH: TSH: 1.28 u[IU]/mL (ref 0.450–4.500)

## 2022-10-10 LAB — HEMOGLOBIN A1C
Est. average glucose Bld gHb Est-mCnc: 111 mg/dL
Hgb A1c MFr Bld: 5.5 % (ref 4.8–5.6)

## 2022-10-10 LAB — HCV INTERPRETATION

## 2022-10-11 LAB — CULTURE, OB URINE

## 2022-10-11 LAB — URINE CULTURE, OB REFLEX

## 2022-10-13 ENCOUNTER — Telehealth: Payer: Self-pay | Admitting: *Deleted

## 2022-10-13 NOTE — Telephone Encounter (Addendum)
-----   Message from Hermina Staggers sent at 10/13/2022  9:09 AM EDT ----- Please let pt know that her vaginal swab was positive for yeast. Please send in Tx Thanks Michal  9/30  1530  Called pt and informed her of test results. Per standing order, pt was recommended to use OTC Monistat vaginal cream @ bedtime x7 nights.  She voiced understanding.

## 2022-10-17 LAB — PANORAMA PRENATAL TEST FULL PANEL:PANORAMA TEST PLUS 5 ADDITIONAL MICRODELETIONS: FETAL FRACTION: 5.8

## 2022-10-23 ENCOUNTER — Telehealth: Payer: Self-pay | Admitting: Family Medicine

## 2022-10-23 NOTE — Telephone Encounter (Signed)
Patient is calling in today because she woke up with some blood on tissue when she wipes, As per patient her 24 year old son jumped on her stomach yesterday and now she is worried that is the reason for the blood today. Patient is 14 wks, she said everything else is normal she does feel pokey feelings but no concerns.

## 2022-10-23 NOTE — Telephone Encounter (Signed)
Called pt and informed her that light spotting is not worrisome. She denies having sharp pain, however has occasional light abdominal discomfort. Pt was instructed to go to MAU if she develops heavy vaginal bleeding or sharp or worsening abdominal pain.

## 2022-10-27 ENCOUNTER — Telehealth: Payer: Self-pay | Admitting: Family Medicine

## 2022-10-27 NOTE — Telephone Encounter (Signed)
Patient has been bleeding more brown and a lot more than last week as far as heavier. Would like a nurse to call back, she spoke to a nurse last week but bleeding has changed

## 2022-10-27 NOTE — Telephone Encounter (Signed)
WGNFAO called front office and call transferred to nurse. Kim Crosby reports since 10/10 that she only noticed dark brown/dark red spotting when she wiped after restroom but  then today when she got up noticed dark brown on her underwear and everytime she wipes. She c/o mild cramps, not severe and no bright red bleeding. We discussed if her bleeding changes to bright red and becomes heavier like a period or she has severe cramps/ pain to go to MAU for evaluation. I also reviewed her last wet prep was negative for infection except showed yeast She reports she has not gotten the monistat for yeast yet because it wasn't bothering her but now is itching so plans to get monistat today. She also c/o feeling like pressure but then confirms she is constipated and this has been a problem before pregnancy but now worse. I reviewed that she can take otc Colace BID and metamucil daily for constipation in pregnancy along with being sure she is drinking enough water/ fluids. She voices understanding. [name]

## 2022-10-31 ENCOUNTER — Telehealth: Payer: Self-pay | Admitting: Family Medicine

## 2022-10-31 DIAGNOSIS — Z3480 Encounter for supervision of other normal pregnancy, unspecified trimester: Secondary | ICD-10-CM

## 2022-10-31 DIAGNOSIS — Z20818 Contact with and (suspected) exposure to other bacterial communicable diseases: Secondary | ICD-10-CM

## 2022-10-31 NOTE — Telephone Encounter (Signed)
Patient called in concerned about maybe being exposed to the listeria outbreak.

## 2022-11-03 ENCOUNTER — Encounter: Payer: Self-pay | Admitting: *Deleted

## 2022-11-03 NOTE — Telephone Encounter (Signed)
I called patient again, but no answer but she has read my message. I will send another message with more information. Kim Crosby

## 2022-11-03 NOTE — Addendum Note (Signed)
Addended by: Gerome Apley on: 11/03/2022 01:42 PM   Modules accepted: Orders

## 2022-11-03 NOTE — Addendum Note (Signed)
Addended by: Gerome Apley on: 11/03/2022 12:36 PM   Modules accepted: Orders

## 2022-11-03 NOTE — Telephone Encounter (Signed)
Patient called again to speak with nurse. I called her and she reports she heard about an outbreak of Listeria from [employer]. She states she had eaten 2 of the foods on the list: salad with chicken and waffles around 10/09/22. She states she has fatigue which she knows can be from pregnancy and occasional headaches. She wants to have a blood test. I informed her I will need to talk with a provider and call her back. I discussed with Dr. Shawnie Pons who ordered blood culture x2 looking for listeria and check with our lab to see if they can do here. I talked with lab and it cannot be done here, but can sent to patient service center on [address]. I called Jewelle and left a message on her voice mail that I was returning her call and will send detailed MyChart message for her to read.  [name]

## 2022-11-06 ENCOUNTER — Ambulatory Visit (INDEPENDENT_AMBULATORY_CARE_PROVIDER_SITE_OTHER): Payer: MEDICAID | Admitting: Obstetrics and Gynecology

## 2022-11-06 ENCOUNTER — Other Ambulatory Visit: Payer: Self-pay

## 2022-11-06 VITALS — BP 133/76 | HR 96 | Wt 230.7 lb

## 2022-11-06 DIAGNOSIS — Z8632 Personal history of gestational diabetes: Secondary | ICD-10-CM

## 2022-11-06 DIAGNOSIS — Z3A16 16 weeks gestation of pregnancy: Secondary | ICD-10-CM

## 2022-11-06 DIAGNOSIS — Z3482 Encounter for supervision of other normal pregnancy, second trimester: Secondary | ICD-10-CM

## 2022-11-06 DIAGNOSIS — Z3480 Encounter for supervision of other normal pregnancy, unspecified trimester: Secondary | ICD-10-CM

## 2022-11-06 DIAGNOSIS — Z20818 Contact with and (suspected) exposure to other bacterial communicable diseases: Secondary | ICD-10-CM

## 2022-11-06 NOTE — Progress Notes (Signed)
   PRENATAL VISIT NOTE  Subjective:  Kim Crosby is a 22 y.o. [phone] at [redacted]w[redacted]d being seen today for ongoing prenatal care.  She is currently monitored for the following issues for this low-risk pregnancy and has MDD (major depressive disorder), severe (HCC); Supervision of other normal pregnancy, antepartum; and History of gestational diabetes on their problem list.  Patient reports 9/25 potential listeria exposure, no symptoms. Had called and spoke with someone, needs lab form to take with her Has been exerperiencing extreme, fatigue and occasional headache.  Feels how she did when she got dx with GDM last pregnancy  . Vag. Bleeding: None.  Movement: Absent. Denies leaking of fluid.   The following portions of the patient's history were reviewed and updated as appropriate: allergies, current medications, past family history, past medical history, past social history, past surgical history and problem list.   Objective:   Vitals:   11/06/22 1115  BP: 133/76  Pulse: 96  Weight: 230 lb 11.2 oz (104.6 kg)    Fetal Status: Fetal Heart Rate (bpm):  (confirmed fetal activity on bedside u/s)   Movement: Absent     General:  Alert, oriented and cooperative. Patient is in no acute distress.  Skin: Skin is warm and dry. No rash noted.   Cardiovascular: Normal heart rate noted  Respiratory: Normal respiratory effort, no problems with respiration noted  Abdomen: Soft, gravid, appropriate for gestational age.  Pain/Pressure: Present     Pelvic: Cervical exam deferred        Extremities: Normal range of motion.     Mental Status: Normal mood and affect. Normal behavior. Normal judgment and thought content.   Assessment and Plan:  Pregnancy: G5P1031 at [redacted]w[redacted]d 1. Supervision of other normal pregnancy, antepartum BP and FHR normal (conducted w/ bedside u/s)  2. History of gestational diabetes GDM previous preg, A1c 5.5, will do early gtt  3. 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 [redacted] weeks gestation of pregnancy    Preterm labor symptoms and general obstetric precautions including but not limited to vaginal bleeding, contractions, leaking of fluid and fetal movement were reviewed in detail with the patient. Please refer to After Visit Summary for other counseling recommendations.   Return in one week for early GTT and two weeks for centering  Future Appointments  Date Time Provider Department Center  11/11/2022  8:50 AM [facility] LAB [facility]  11/17/2022  9:00 AM CENTERING PROVIDER [place] [facility]  11/24/2022 12:15 PM [facility] NURSE [facility]  11/24/2022 12:30 PM [facility] US1 [facility]  12/15/2022  9:00 AM CENTERING PROVIDER [facility]  01/12/2023  9:00 AM CENTERING PROVIDER [facility]  01/26/2023  9:00 AM CENTERING PROVIDER [facility]  02/09/2023  9:00 AM CENTERING PROVIDER [facility]  02/23/2023  9:00 AM CENTERING PROVIDER [facility]  03/09/2023  9:00 AM CENTERING PROVIDER [facility]  03/23/2023  9:00 AM CENTERING PROVIDER [facility]  04/06/2023  9:00 AM CENTERING PROVIDER [facility]  04/20/2023  9:00 AM CENTERING PROVIDER [facility]     Albertine Grates, FNP

## 2022-11-10 ENCOUNTER — Other Ambulatory Visit: Payer: Self-pay | Admitting: *Deleted

## 2022-11-10 DIAGNOSIS — Z3480 Encounter for supervision of other normal pregnancy, unspecified trimester: Secondary | ICD-10-CM

## 2022-11-10 DIAGNOSIS — O99210 Obesity complicating pregnancy, unspecified trimester: Secondary | ICD-10-CM | POA: Insufficient documentation

## 2022-11-11 ENCOUNTER — Other Ambulatory Visit: Payer: No Typology Code available for payment source

## 2022-11-17 ENCOUNTER — Ambulatory Visit (INDEPENDENT_AMBULATORY_CARE_PROVIDER_SITE_OTHER): Payer: MEDICAID | Admitting: Advanced Practice Midwife

## 2022-11-17 ENCOUNTER — Other Ambulatory Visit: Payer: Self-pay

## 2022-11-17 VITALS — BP 131/83 | HR 96 | Wt 233.2 lb

## 2022-11-17 DIAGNOSIS — Z8632 Personal history of gestational diabetes: Secondary | ICD-10-CM

## 2022-11-17 DIAGNOSIS — Z3480 Encounter for supervision of other normal pregnancy, unspecified trimester: Secondary | ICD-10-CM

## 2022-11-17 DIAGNOSIS — Z3A18 18 weeks gestation of pregnancy: Secondary | ICD-10-CM

## 2022-11-17 NOTE — Progress Notes (Signed)
       PRENATAL VISIT NOTE- Centering Pregnancy Cycle 16 , Session # 1  Subjective:  Kim Crosby is a 22 y.o. [id number] at [redacted]w[redacted]d being seen today for ongoing prenatal care through Centering Pregnancy.  She is currently monitored for the following issues for this low-risk pregnancy and has MDD (major depressive disorder), severe (HCC); Supervision of other normal pregnancy, antepartum; History of gestational diabetes; and Obesity affecting pregnancy, antepartum on their problem list.  Patient reports no complaints.  Contractions: Not present. Vag. Bleeding: None.  Movement: Absent. Denies leaking of fluid/ROM.   The following portions of the patient's history were reviewed and updated as appropriate: allergies, current medications, past family history, past medical history, past social history, past surgical history and problem list. Problem list updated.  Objective:   Vitals:   11/17/22 0918  BP: 131/83  Pulse: 96  Weight: 233 lb 3.2 oz (105.8 kg)    Fetal Status: Fetal Heart Rate (bpm): 147   Movement: Absent     General:  Alert, oriented and cooperative. Patient is in no acute distress.  Skin: Skin is warm and dry. No rash noted.   Cardiovascular: Normal heart rate noted  Respiratory: Normal respiratory effort, no problems with respiration noted  Abdomen: Soft, gravid, appropriate for gestational age.  Pain/Pressure: Absent     Pelvic: Cervical exam deferred        Extremities: Normal range of motion.  Edema: None  Mental Status: Normal mood and affect. Normal behavior. Normal judgment and thought content.   Assessment and Plan:  Pregnancy: G5P1031 at [redacted]w[redacted]d  1. Supervision of other normal pregnancy, antepartum --Anticipatory guidance about next visits/weeks of pregnancy given.    Centering Pregnancy, Session#1: Introduction to model of care. Group determined rules for self-governance and closing phrase. Oriented group to space and mother's notebook.   Facilitated  discussion today:  common discomforts, When to call practice  Mindfulness activity completed as well as introduction to deep breathing for childbirth preparation- Centering 3 Breaths  Fundal height and FHR appropriate today unless noted otherwise in plan of care. Patient to continue group care.    - AFP, Serum, Open Spina Bifida  2. [redacted] weeks gestation of pregnancy   3. History of gestational diabetes --A1C wnl, retest at 26-28 weeks   Preterm labor symptoms and general obstetric precautions including but not limited to vaginal bleeding, contractions, leaking of fluid and fetal movement were reviewed in detail with the patient. Please refer to After Visit Summary for other counseling recommendations.    Future Appointments  Date Time Provider Department Center  11/24/2022 12:15 PM [facility] NURSE [facility]  11/24/2022 12:30 PM [facility] US1 [facility]  12/15/2022  9:00 AM CENTERING PROVIDER [facility]  01/12/2023  9:00 AM CENTERING PROVIDER [facility]  01/26/2023  9:00 AM CENTERING PROVIDER [facility]  02/09/2023  9:00 AM CENTERING PROVIDER [facility]  02/23/2023  9:00 AM CENTERING PROVIDER [facility]  03/09/2023  9:00 AM CENTERING PROVIDER [facility]  03/23/2023  9:00 AM CENTERING PROVIDER [facility]  04/06/2023  9:00 AM CENTERING PROVIDER [facility]  04/20/2023  9:00 AM CENTERING PROVIDER [facility]    Sharen Counter, CNM

## 2022-11-19 LAB — AFP, SERUM, OPEN SPINA BIFIDA
AFP MoM: 1.13
AFP Value: 42.7 ng/mL
Gest. Age on Collection Date: 18.3 wk
Maternal Age At EDD: 24.7 a
OSBR Risk 1 IN: 10000
Test Results:: NEGATIVE
Weight: 233 [lb_av]

## 2022-11-24 ENCOUNTER — Ambulatory Visit: Payer: No Typology Code available for payment source

## 2022-11-24 DIAGNOSIS — O99210 Obesity complicating pregnancy, unspecified trimester: Secondary | ICD-10-CM

## 2022-12-09 ENCOUNTER — Telehealth: Payer: Self-pay

## 2022-12-09 NOTE — Telephone Encounter (Signed)
Patient called office. Call transferred to clinical staff. Reports generalized abdominal pain since waking up this morning. She felt that this was related to need to have BM. Felt a similar pain last night and this was relieved by BM. Reports after having a BM this morning, she laid down and felt relief. Concerned that the pain may come back when she has to get up for work. Offered for patient to trial Tylenol, heating pad, or warm bath. Pt does feel the stool is hard to pass and she may be constipated. Reviewed options such as Miralax, increased walking, increased hydration to promote regular BM. Pt denies any bleeding today-- reports spotting a few days ago after wiping. She feels this was related to wiping too hard. Reviewed she should present to MAU for eval if pain does not improve with relief measures, worsens, or if she has any vaginal bleeding today. Patient may call back with further concerns.

## 2022-12-17 ENCOUNTER — Telehealth: Payer: Self-pay | Admitting: Family Medicine

## 2022-12-17 DIAGNOSIS — Z3480 Encounter for supervision of other normal pregnancy, unspecified trimester: Secondary | ICD-10-CM

## 2022-12-17 NOTE — Telephone Encounter (Signed)
Patient is interested in doula program that we offer here, would like a call from a nurse to talk about it

## 2022-12-18 NOTE — Telephone Encounter (Signed)
Left message stating that a referral has been placed for her request if she could f/u at appt on 01/12/23 and if she has any questions to please give the office a call.   Alana Dayton,RN  12/18/22

## 2022-12-19 ENCOUNTER — Telehealth: Payer: Self-pay | Admitting: Family Medicine

## 2022-12-19 NOTE — Telephone Encounter (Signed)
I called Kim Crosby and she states she was wondering if the doula can be someone she used before and it be free. I explained it is a new service and they should call her in the next 2 weeks or so and they will be able to answer her questions. I also explained I do not know the name of the company or doulas they use but it is her choice if she uses this service or someone of her own choice that she pays. I asked her to let us know if she hasn't heard from the doula service by her next ob visit 01/12/23. She voices understanding. [name]

## 2022-12-19 NOTE — Telephone Encounter (Signed)
Patient received a message from Nurse about a referral we are sending for a doula program. Patient was told to call the office if she has any questions. I told patient that there is no nurse available to speak now but I will leave a note for someone to give her a call back so that she can get a better understanding of the situation.

## 2022-12-26 ENCOUNTER — Ambulatory Visit: Payer: No Typology Code available for payment source | Admitting: *Deleted

## 2022-12-26 ENCOUNTER — Ambulatory Visit: Payer: No Typology Code available for payment source

## 2022-12-26 ENCOUNTER — Ambulatory Visit: Payer: No Typology Code available for payment source | Attending: Obstetrics and Gynecology

## 2022-12-26 ENCOUNTER — Other Ambulatory Visit: Payer: Self-pay | Admitting: *Deleted

## 2022-12-26 VITALS — BP 121/74 | HR 123

## 2022-12-26 DIAGNOSIS — O09292 Supervision of pregnancy with other poor reproductive or obstetric history, second trimester: Secondary | ICD-10-CM | POA: Insufficient documentation

## 2022-12-26 DIAGNOSIS — Z3480 Encounter for supervision of other normal pregnancy, unspecified trimester: Secondary | ICD-10-CM

## 2022-12-26 DIAGNOSIS — O99212 Obesity complicating pregnancy, second trimester: Secondary | ICD-10-CM

## 2022-12-26 DIAGNOSIS — Z363 Encounter for antenatal screening for malformations: Secondary | ICD-10-CM | POA: Insufficient documentation

## 2022-12-26 DIAGNOSIS — Z3A23 23 weeks gestation of pregnancy: Secondary | ICD-10-CM | POA: Diagnosis not present

## 2022-12-26 DIAGNOSIS — O24112 Pre-existing diabetes mellitus, type 2, in pregnancy, second trimester: Secondary | ICD-10-CM | POA: Insufficient documentation

## 2022-12-26 DIAGNOSIS — Z362 Encounter for other antenatal screening follow-up: Secondary | ICD-10-CM

## 2023-01-11 ENCOUNTER — Inpatient Hospital Stay (HOSPITAL_COMMUNITY)
Admission: AD | Admit: 2023-01-11 | Discharge: 2023-01-11 | Disposition: A | Discharge status: Home / Self Care | Payer: No Typology Code available for payment source | Attending: Obstetrics & Gynecology | Admitting: Obstetrics & Gynecology

## 2023-01-11 ENCOUNTER — Other Ambulatory Visit: Payer: Self-pay

## 2023-01-11 ENCOUNTER — Encounter (HOSPITAL_COMMUNITY): Payer: Self-pay | Admitting: Obstetrics & Gynecology

## 2023-01-11 ENCOUNTER — Inpatient Hospital Stay (HOSPITAL_COMMUNITY): Payer: No Typology Code available for payment source

## 2023-01-11 DIAGNOSIS — K2100 Gastro-esophageal reflux disease with esophagitis, without bleeding: Secondary | ICD-10-CM | POA: Insufficient documentation

## 2023-01-11 DIAGNOSIS — O26892 Other specified pregnancy related conditions, second trimester: Secondary | ICD-10-CM | POA: Diagnosis present

## 2023-01-11 DIAGNOSIS — R079 Chest pain, unspecified: Secondary | ICD-10-CM | POA: Diagnosis not present

## 2023-01-11 DIAGNOSIS — Z3A26 26 weeks gestation of pregnancy: Secondary | ICD-10-CM | POA: Diagnosis not present

## 2023-01-11 DIAGNOSIS — R0602 Shortness of breath: Secondary | ICD-10-CM | POA: Insufficient documentation

## 2023-01-11 DIAGNOSIS — O99612 Diseases of the digestive system complicating pregnancy, second trimester: Secondary | ICD-10-CM | POA: Insufficient documentation

## 2023-01-11 LAB — URINALYSIS, ROUTINE W REFLEX MICROSCOPIC
Bacteria, UA: NONE SEEN
Bilirubin Urine: NEGATIVE
Glucose, UA: NEGATIVE mg/dL
Hgb urine dipstick: NEGATIVE
Ketones, ur: NEGATIVE mg/dL
Nitrite: NEGATIVE
Protein, ur: NEGATIVE mg/dL
Specific Gravity, Urine: 1.012 (ref 1.005–1.030)
pH: 6 (ref 5.0–8.0)

## 2023-01-11 LAB — COMPREHENSIVE METABOLIC PANEL
ALT: 16 U/L (ref 0–44)
AST: 15 U/L (ref 15–41)
Albumin: 2.7 g/dL — ABNORMAL LOW (ref 3.5–5.0)
Alkaline Phosphatase: 68 U/L (ref 38–126)
Anion gap: 10 (ref 5–15)
BUN: 5 mg/dL — ABNORMAL LOW (ref 6–20)
CO2: 20 mmol/L — ABNORMAL LOW (ref 22–32)
Calcium: 9.4 mg/dL (ref 8.9–10.3)
Chloride: 104 mmol/L (ref 98–111)
Creatinine, Ser: 0.52 mg/dL (ref 0.44–1.00)
GFR, Estimated: >60 mL/min (ref >60)
Glucose, Bld: 76 mg/dL (ref 70–99)
Potassium: 3.7 mmol/L (ref 3.5–5.1)
Sodium: 134 mmol/L — ABNORMAL LOW (ref 135–145)
Total Bilirubin: 0.2 mg/dL (ref <1.2)
Total Protein: 6.4 g/dL — ABNORMAL LOW (ref 6.5–8.1)

## 2023-01-11 LAB — C-REACTIVE PROTEIN: CRP: 2.3 mg/dL — ABNORMAL HIGH (ref <1.0)

## 2023-01-11 LAB — CBC WITH DIFFERENTIAL/PLATELET
Abs Immature Granulocytes: 0.11 10*3/uL — ABNORMAL HIGH (ref 0.00–0.07)
Basophils Absolute: 0 10*3/uL (ref 0.0–0.1)
Basophils Relative: 0 %
Eosinophils Absolute: 0.2 10*3/uL (ref 0.0–0.5)
Eosinophils Relative: 1 %
HCT: 34.6 % — ABNORMAL LOW (ref 36.0–46.0)
Hemoglobin: 11.5 g/dL — ABNORMAL LOW (ref 12.0–15.0)
Immature Granulocytes: 1 %
Lymphocytes Relative: 28 %
Lymphs Abs: 3.4 10*3/uL (ref 0.7–4.0)
MCH: 27.2 pg (ref 26.0–34.0)
MCHC: 33.2 g/dL (ref 30.0–36.0)
MCV: 81.8 fL (ref 80.0–100.0)
Monocytes Absolute: 1 10*3/uL (ref 0.1–1.0)
Monocytes Relative: 9 %
Neutro Abs: 7.3 10*3/uL (ref 1.7–7.7)
Neutrophils Relative %: 61 %
Platelets: 318 10*3/uL (ref 150–400)
RBC: 4.23 MIL/uL (ref 3.87–5.11)
RDW: 15.7 % — ABNORMAL HIGH (ref 11.5–15.5)
WBC: 12 10*3/uL — ABNORMAL HIGH (ref 4.0–10.5)
nRBC: 0 % (ref 0.0–0.2)

## 2023-01-11 LAB — MAGNESIUM: Magnesium: 1.6 mg/dL — ABNORMAL LOW (ref 1.7–2.4)

## 2023-01-11 LAB — BRAIN NATRIURETIC PEPTIDE: B Natriuretic Peptide: 11.7 pg/mL (ref 0.0–100.0)

## 2023-01-11 LAB — PHOSPHORUS: Phosphorus: 5 mg/dL — ABNORMAL HIGH (ref 2.5–4.6)

## 2023-01-11 LAB — TROPONIN I (HIGH SENSITIVITY): Troponin I (High Sensitivity): 3 ng/L (ref <18)

## 2023-01-11 MED ORDER — LIDOCAINE VISCOUS HCL 2 % MT SOLN: 15.0000 mL | Freq: Once | OROMUCOSAL | Status: DC

## 2023-01-11 MED ORDER — SIMETHICONE 80 MG PO CHEW
160.0000 mg | CHEWABLE_TABLET | Freq: Once | ORAL | Status: AC
  Administered 2023-01-11: 160 mg via ORAL
  Filled 2023-01-11: qty 2

## 2023-01-11 MED ORDER — ALUM & MAG HYDROXIDE-SIMETH 200-200-20 MG/5ML PO SUSP: 30.0000 mL | Freq: Once | ORAL | Status: DC

## 2023-01-11 MED ORDER — PANTOPRAZOLE SODIUM 20 MG PO TBEC: 40.0000 mg | DELAYED_RELEASE_TABLET | Freq: Every day | ORAL | 3 refills | Status: DC

## 2023-01-11 MED ORDER — PANTOPRAZOLE SODIUM 20 MG PO TBEC
20.0000 mg | DELAYED_RELEASE_TABLET | Freq: Every day | ORAL | Status: DC
  Filled 2023-01-11: qty 1

## 2023-01-11 NOTE — MAU Note (Signed)
Pt remains in no acute distress-respirations are even and unlabored. O2 saturations are WNL. Pain does not radiate. Pt is A&O x4

## 2023-01-11 NOTE — MAU Note (Signed)
.  Kim Crosby is a 22 y.o. at [redacted]w[redacted]d here in MAU reporting: having chest pain - "feels kind of like soreness" coming and going throughout the day. States it's just uncomfortable and sore in the middle of chest "feels like someone's punching me". Denies SOB. +FM. Denies VB, LOF, or ctx.   Onset of complaint: all day  Pain score: 7 Vitals:   01/11/23 0214  BP: 133/81  Pulse: 88  Resp: 16  Temp: 98 F (36.7 C)  SpO2: 100%     FHT:150 Lab orders placed from triage: UA

## 2023-01-11 NOTE — MAU Provider Note (Signed)
Chief Complaint:  Chest Pain   HPI   None     Kim Crosby is a 22 y.o. [phone] at [redacted]w[redacted]d who presents to maternity admissions reporting chest pain that is central and aching in character, not constant. Took her bra off, otherwise has not tried anything. Nothing makes it better or worse. Reports that she has had heartburn this pregnancy and this pain is different. Reports that she is having shortness of breath mostly at night, but also could be due to congestion.    Pregnancy Course: Benign  Past Medical History:  Diagnosis Date   Anemia    Anxiety    Bipolar 2 disorder (HCC)    Depression    Gestational diabetes    Retinitis    OB History  Gravida Para Term Preterm AB Living  5 1 1  3 1   SAB IAB Ectopic Multiple Live Births  1 2  0 1    # Outcome Date GA Lbr Len/2nd Weight Sex Type Anes PTL Lv  5 Current           4 IAB 11/08/21          3 SAB 04/13/21          2 Term 10/31/19 [redacted]w[redacted]d 23:28 / 01:32 3824 g M Vag-Spont EPI  LIV  1 IAB            Past Surgical History:  Procedure Laterality Date   INDUCED ABORTION     REFRACTIVE SURGERY Bilateral    Family History  Problem Relation Age of Onset   Diabetes Mother    Gestational diabetes Mother        At younger age   Asthma Father    Diabetes Maternal Grandmother    Kidney disease Maternal Grandmother    Gestational diabetes Maternal Grandmother        When younger   Hypertension Paternal Grandfather    Cancer Neg Hx    Social History   Tobacco Use   Smoking status: Never   Smokeless tobacco: Never  Vaping Use   Vaping status: Never Used  Substance Use Topics   Alcohol use: No   Drug use: Not Currently    Types: Marijuana    Comment: last use Aug 2024   Allergies  Allergen Reactions   Pork-Derived Products Other (See Comments)    Life style preference Other reaction(s): Other (See Comments) Life style preference   Medications Prior to Admission  Medication Sig Dispense Refill Last Dose/Taking    Prenatal 27-1 MG TABS Take 1 tablet by mouth daily. 30 tablet 11 Past Week   acetaminophen (TYLENOL) 325 MG tablet Take 2 tablets (650 mg total) by mouth every 4 (four) hours as needed (for pain scale < 4).       I have reviewed patient's Past Medical Hx, Surgical Hx, Family Hx, Social Hx, medications and allergies.   ROS  Pertinent items noted in HPI and remainder of comprehensive ROS otherwise negative.   PHYSICAL EXAM  Patient Vitals for the past 24 hrs:  BP Temp Temp src Pulse Resp SpO2 Height Weight  01/11/23 0415 -- -- -- -- -- 99 % -- --  01/11/23 0405 -- -- -- -- -- 98 % -- --  01/11/23 0400 -- -- -- -- -- 99 % -- --  01/11/23 0355 -- -- -- -- -- 99 % -- --  01/11/23 0331 -- -- -- -- -- 100 % -- --  01/11/23 0330 -- -- -- -- --  100 % -- --  01/11/23 0310 -- -- -- -- -- 99 % -- --  01/11/23 0305 -- -- -- -- -- 100 % -- --  01/11/23 0300 -- -- -- -- -- 99 % -- --  01/11/23 0255 -- -- -- -- -- 100 % -- --  01/11/23 0250 -- -- -- -- -- 99 % -- --  01/11/23 0245 -- -- -- -- -- 99 % -- --  01/11/23 0244 127/78 -- -- 99 -- -- -- --  01/11/23 0214 133/81 98 F (36.7 C) Oral 88 16 100 % 5' 3.5" (1.613 m) 115.2 kg    Constitutional: Well-developed, well-nourished female in no acute distress.  Cardiovascular: normal rate & rhythm, warm and well-perfused Respiratory: normal effort, no problems with respiration noted GI: Abd soft, non-tender, non-distended MS: Extremities nontender, no edema, normal ROM Neurologic: Alert and oriented x 4.  GU: no CVA tenderness Pelvic: NEFG, physiologic discharge, no blood, cervix clean.      Fetal Tracing: Baseline: 135 Variability: mod Accelerations: 15x15 Decelerations: Toco: quiet   Labs: Results for orders placed or performed during the hospital encounter of 01/11/23 (from the past 24 hours)  Urinalysis, Routine w reflex microscopic -Urine, Clean Catch     Status: Abnormal   Collection Time: 01/11/23  2:20 AM  Result Value Ref Range    Color, Urine YELLOW YELLOW   APPearance HAZY (A) CLEAR   Specific Gravity, Urine 1.012 1.005 - 1.030   pH 6.0 5.0 - 8.0   Glucose, UA NEGATIVE NEGATIVE mg/dL   Hgb urine dipstick NEGATIVE NEGATIVE   Bilirubin Urine NEGATIVE NEGATIVE   Ketones, ur NEGATIVE NEGATIVE mg/dL   Protein, ur NEGATIVE NEGATIVE mg/dL   Nitrite NEGATIVE NEGATIVE   Leukocytes,Ua TRACE (A) NEGATIVE   RBC / HPF 0-5 0 - 5 RBC/hpf   WBC, UA 0-5 0 - 5 WBC/hpf   Bacteria, UA NONE SEEN NONE SEEN   Squamous Epithelial / HPF 0-5 0 - 5 /HPF   Mucus PRESENT   Magnesium     Status: Abnormal   Collection Time: 01/11/23  3:41 AM  Result Value Ref Range   Magnesium 1.6 (L) 1.7 - 2.4 mg/dL  Phosphorus     Status: Abnormal   Collection Time: 01/11/23  3:41 AM  Result Value Ref Range   Phosphorus 5.0 (H) 2.5 - 4.6 mg/dL  CBC with Differential/Platelet     Status: Abnormal   Collection Time: 01/11/23  3:41 AM  Result Value Ref Range   WBC 12.0 (H) 4.0 - 10.5 K/uL   RBC 4.23 3.87 - 5.11 MIL/uL   Hemoglobin 11.5 (L) 12.0 - 15.0 g/dL   HCT 69.6 (L) 29.5 - 28.4 %   MCV 81.8 80.0 - 100.0 fL   MCH 27.2 26.0 - 34.0 pg   MCHC 33.2 30.0 - 36.0 g/dL   RDW 13.2 (H) 44.0 - 10.2 %   Platelets 318 150 - 400 K/uL   nRBC 0.0 0.0 - 0.2 %   Neutrophils Relative % 61 %   Neutro Abs 7.3 1.7 - 7.7 K/uL   Lymphocytes Relative 28 %   Lymphs Abs 3.4 0.7 - 4.0 K/uL   Monocytes Relative 9 %   Monocytes Absolute 1.0 0.1 - 1.0 K/uL   Eosinophils Relative 1 %   Eosinophils Absolute 0.2 0.0 - 0.5 K/uL   Basophils Relative 0 %   Basophils Absolute 0.0 0.0 - 0.1 K/uL   Immature Granulocytes 1 %   Abs Immature Granulocytes  0.11 (H) 0.00 - 0.07 K/uL  Brain natriuretic peptide     Status: None   Collection Time: 01/11/23  3:41 AM  Result Value Ref Range   B Natriuretic Peptide 11.7 0.0 - 100.0 pg/mL  Troponin I (High Sensitivity)     Status: None   Collection Time: 01/11/23  3:41 AM  Result Value Ref Range   Troponin I (High Sensitivity) 3  <18 ng/L  C-reactive protein     Status: Abnormal   Collection Time: 01/11/23  3:41 AM  Result Value Ref Range   CRP 2.3 (H) <1.0 mg/dL  Comprehensive metabolic panel     Status: Abnormal   Collection Time: 01/11/23  3:41 AM  Result Value Ref Range   Sodium 134 (L) 135 - 145 mmol/L   Potassium 3.7 3.5 - 5.1 mmol/L   Chloride 104 98 - 111 mmol/L   CO2 20 (L) 22 - 32 mmol/L   Glucose, Bld 76 70 - 99 mg/dL   BUN 5 (L) 6 - 20 mg/dL   Creatinine, Ser 4.09 0.44 - 1.00 mg/dL   Calcium 9.4 8.9 - 81.1 mg/dL   Total Protein 6.4 (L) 6.5 - 8.1 g/dL   Albumin 2.7 (L) 3.5 - 5.0 g/dL   AST 15 15 - 41 U/L   ALT 16 0 - 44 U/L   Alkaline Phosphatase 68 38 - 126 U/L   Total Bilirubin 0.2 <1.2 mg/dL   GFR, Estimated >91 >47 mL/min   Anion gap 10 5 - 15    Imaging:  DG CHEST PORT 1 VIEW Result Date: 01/11/2023 CLINICAL DATA:  Chest pain. EXAM: PORTABLE CHEST 1 VIEW COMPARISON:  None Available. FINDINGS: Lung volumes are low.The cardiomediastinal contours are normal. The lungs are clear. Pulmonary vasculature is normal. No consolidation, pleural effusion, or pneumothorax. No acute osseous abnormalities are seen. IMPRESSION: Low lung volumes without acute chest finding. Electronically Signed   By: Narda Rutherford M.D.   On: 01/11/2023 03:17    MDM & MAU COURSE  MDM: Labs: UA, CXR, EKG, electrolyte and cardiac Negative for cardiac disease Consulted Dr. Despina Hidden who recommends GI cocktail and protonix Partial relief of symptoms following medication Dr. Despina Hidden recommends home prescription for protonix  MAU Course: Orders Placed This Encounter  Procedures   DG CHEST PORT 1 VIEW   Urinalysis, Routine w reflex microscopic -Urine, Clean Catch   Magnesium   Phosphorus   CBC with Differential/Platelet   Brain natriuretic peptide   C-reactive protein   Comprehensive metabolic panel   EKG 12-Lead   EKG 12-Lead   Discharge patient   Meds ordered this encounter  Medications   pantoprazole (PROTONIX)  EC tablet 20 mg   simethicone (MYLICON) chewable tablet 160 mg   AND Linked Order Group    alum & mag hydroxide-simeth (MAALOX/MYLANTA) 200-200-20 MG/5ML suspension 30 mL    lidocaine (XYLOCAINE) 2 % viscous mouth solution 15 mL   pantoprazole (PROTONIX) 20 MG tablet    Sig: Take 2 tablets (40 mg total) by mouth daily.    Dispense:  60 tablet    Refill:  3    Supervising Provider:   Reva Bores [[id number]]    ASSESSMENT   1. Gastroesophageal reflux disease with esophagitis without hemorrhage   Negative for cardiac disease, symptoms consistent with GERD Consulted Dr. Despina Hidden who recommends GI cocktail and protonix Partial relief of symptoms following medication Dr. Despina Hidden recommends home prescription for protonix  PLAN  Discharge home in stable condition with return precautions.  Prescriptions sent. Safe medications counseled. Counseled to keep all upcoming appointments.  Return to MAU PRN.   Allergies as of 01/11/2023       Reactions   Pork-derived Products Other (See Comments)   Life style preference Other reaction(s): Other (See Comments) Life style preference        Medication List     TAKE these medications    acetaminophen 325 MG tablet Commonly known as: Tylenol Take 2 tablets (650 mg total) by mouth every 4 (four) hours as needed (for pain scale < 4).   pantoprazole 20 MG tablet Commonly known as: PROTONIX Take 2 tablets (40 mg total) by mouth daily.   Prenatal 27-1 MG Tabs Take 1 tablet by mouth daily.        Lamont Snowball, MSN, CNM, RNC-OB Certified Nurse Midwife, [facility] 01/11/2023 6:30 AM

## 2023-01-14 NOTE — L&D Delivery Note (Signed)
 Faculty Note  In to room to check patient, having variable decels with each contraction, patient found to be 10/100/+1 on exam. Patient prepped for delivery, with next contraction, FHR went to 50s and did not recover. Counseled patient on need to push and have a baby, counseled regarding recommendation for vacuum assisted vaginal delivery to facilitate delivery given non-reassuring fetal tracing. Reviewed risks of vacuum assisted delivery including risk of cephalohematoma, subgaleal hemorrhage, failed vacuum or dystocia requiring emergency c-section, risks of prolonged hypoxia in fetus, risks of damage to maternal tissue. She verbalizes understanding of the above and is agreeable to a vacuum assisted delivery.  Kiwi suction cup applied to fetal head, 2 cm anterior to the posterior fontanelle and equidistant across the sagittal sutures. The cup was inspected to ensure to vaginal tissue free from cup. Suction increased to 450 mm Hg and gentle traction placed on vacuum device during contraction to assist maternal expulsive forces. Total applied pressure time aprox 30 seconds with 3 pulls. No popoffs occurred. Patient pushed very effectively. Suction released with delivery of fetal head. Infant delivered easily through double nuchal cord from LOA position and vigorous on delivery, nuchal cord reduced after delivery. Delayed cord clamping done.   Cord blood obtained. Placenta with avulsed cord and removed manually with trailing membranes delivered spontaneously. Manual sweep of uterus with no concern for remaining membranes, placenta appears intact. Very minor periurethral abrasion noted, not repaired. Weight 2740gms. Apgars 8/9. EBL: 200 mL.    Baldemar Lenis, MD, [facility] Attending Center for [employer] [facility])

## 2023-01-22 ENCOUNTER — Telehealth: Payer: Self-pay

## 2023-01-22 NOTE — Telephone Encounter (Signed)
 Left message to f/u with pt as she has missed Centering appt.  Requested pt to a return call back.  MyChart message sent.    Leonette Nutting  01/22/23

## 2023-01-27 ENCOUNTER — Ambulatory Visit: Payer: No Typology Code available for payment source | Attending: Maternal & Fetal Medicine

## 2023-01-27 ENCOUNTER — Ambulatory Visit: Payer: No Typology Code available for payment source

## 2023-01-29 NOTE — Telephone Encounter (Signed)
Attempted to contact x 2 and left message stating that we are calling to get her scheduled for an appt.  If she could please give [place] a call or respond to MyChart message.   Kim Crosby  01/29/23

## 2023-02-04 ENCOUNTER — Encounter (HOSPITAL_COMMUNITY): Payer: Self-pay | Admitting: Obstetrics and Gynecology

## 2023-02-04 ENCOUNTER — Inpatient Hospital Stay (HOSPITAL_COMMUNITY)
Admission: AD | Admit: 2023-02-04 | Discharge: 2023-02-04 | Disposition: A | Discharge status: Home / Self Care | Payer: No Typology Code available for payment source | Attending: Obstetrics and Gynecology | Admitting: Obstetrics and Gynecology

## 2023-02-04 ENCOUNTER — Inpatient Hospital Stay (HOSPITAL_BASED_OUTPATIENT_CLINIC_OR_DEPARTMENT_OTHER): Payer: No Typology Code available for payment source

## 2023-02-04 DIAGNOSIS — Z3A29 29 weeks gestation of pregnancy: Secondary | ICD-10-CM | POA: Diagnosis not present

## 2023-02-04 DIAGNOSIS — M7918 Myalgia, other site: Secondary | ICD-10-CM | POA: Insufficient documentation

## 2023-02-04 DIAGNOSIS — M549 Dorsalgia, unspecified: Secondary | ICD-10-CM | POA: Insufficient documentation

## 2023-02-04 DIAGNOSIS — O26893 Other specified pregnancy related conditions, third trimester: Secondary | ICD-10-CM | POA: Diagnosis present

## 2023-02-04 DIAGNOSIS — M79604 Pain in right leg: Secondary | ICD-10-CM | POA: Diagnosis not present

## 2023-02-04 DIAGNOSIS — M79661 Pain in right lower leg: Secondary | ICD-10-CM

## 2023-02-04 DIAGNOSIS — M79605 Pain in left leg: Secondary | ICD-10-CM | POA: Diagnosis not present

## 2023-02-04 DIAGNOSIS — M791 Myalgia, unspecified site: Secondary | ICD-10-CM

## 2023-02-04 LAB — CBC
HCT: 32.7 % — ABNORMAL LOW (ref 36.0–46.0)
Hemoglobin: 11 g/dL — ABNORMAL LOW (ref 12.0–15.0)
MCH: 27.5 pg (ref 26.0–34.0)
MCHC: 33.6 g/dL (ref 30.0–36.0)
MCV: 81.8 fL (ref 80.0–100.0)
Platelets: 289 10*3/uL (ref 150–400)
RBC: 4 MIL/uL (ref 3.87–5.11)
RDW: 14.9 % (ref 11.5–15.5)
WBC: 8.6 10*3/uL (ref 4.0–10.5)
nRBC: 0 % (ref 0.0–0.2)

## 2023-02-04 LAB — COMPREHENSIVE METABOLIC PANEL
ALT: 14 U/L (ref 0–44)
AST: 19 U/L (ref 15–41)
Albumin: 2.6 g/dL — ABNORMAL LOW (ref 3.5–5.0)
Alkaline Phosphatase: 67 U/L (ref 38–126)
Anion gap: 12 (ref 5–15)
BUN: 6 mg/dL (ref 6–20)
CO2: 21 mmol/L — ABNORMAL LOW (ref 22–32)
Calcium: 9.4 mg/dL (ref 8.9–10.3)
Chloride: 104 mmol/L (ref 98–111)
Creatinine, Ser: 0.5 mg/dL (ref 0.44–1.00)
GFR, Estimated: >60 mL/min (ref >60)
Glucose, Bld: 70 mg/dL (ref 70–99)
Potassium: 4.2 mmol/L (ref 3.5–5.1)
Sodium: 137 mmol/L (ref 135–145)
Total Bilirubin: 0.6 mg/dL (ref 0.0–1.2)
Total Protein: 6.3 g/dL — ABNORMAL LOW (ref 6.5–8.1)

## 2023-02-04 LAB — PROTEIN / CREATININE RATIO, URINE
Creatinine, Urine: 90 mg/dL
Protein Creatinine Ratio: 0.16 mg/mg{creat} — ABNORMAL HIGH (ref 0.00–0.15)
Total Protein, Urine: 14 mg/dL

## 2023-02-04 MED ORDER — ACETAMINOPHEN 500 MG PO TABS
1000.0000 mg | ORAL_TABLET | Freq: Once | ORAL | Status: AC
  Administered 2023-02-04: 1000 mg via ORAL
  Filled 2023-02-04: qty 2

## 2023-02-04 MED ORDER — CYCLOBENZAPRINE HCL 5 MG PO TABS
10.0000 mg | ORAL_TABLET | Freq: Once | ORAL | Status: AC
  Administered 2023-02-04: 10 mg via ORAL
  Filled 2023-02-04: qty 2

## 2023-02-04 MED ORDER — CYCLOBENZAPRINE HCL 10 MG PO TABS: 10.0000 mg | ORAL_TABLET | Freq: Three times a day (TID) | ORAL | 0 refills | Status: DC | PRN

## 2023-02-04 NOTE — Progress Notes (Signed)
Bilateral lower ext venous  has been completed. Refer to [facility] under chart review to view preliminary results.   02/04/2023  7:14 PM Leonilda Cozby, Gerarda Gunther

## 2023-02-04 NOTE — MAU Provider Note (Signed)
Chief Complaint:  Back Pain and Leg Pain   HPI    Kim Crosby is a 23 y.o. [id number] at [redacted]w[redacted]d who presents to maternity admissions reporting severe B/L leg pain and leg ramps 7/10 on pain scale. She reports that it's worse when she is ambulating and getting in and out of her car but they hurt regardless of weather she is walking/sitting/standing/lying down. Works from home and also c/o severe pain in her legs and she tries to stretch them out but has been unsuccessful. She states she has not taken anything for relief today. Her back pain is intermittent and she has not taken anything for her back pain either. She also is concerned about her elevated BP's with occasional visual changes and she "is worried" since she has goggled this.  Pregnancy Course: [facility] (missed her last set of appointments)  Past Medical History:  Diagnosis Date   Anemia    Anxiety    Bipolar 2 disorder (HCC)    Depression    Gestational diabetes    Retinitis    OB History  Gravida Para Term Preterm AB Living  5 1 1  3 1   SAB IAB Ectopic Multiple Live Births  1 2  0 1    # Outcome Date GA Lbr Len/2nd Weight Sex Type Anes PTL Lv  5 Current           4 IAB 11/08/21          3 SAB 04/13/21          2 Term 10/31/19 [redacted]w[redacted]d 23:28 / 01:32 3824 g M Vag-Spont EPI  LIV  1 IAB            Past Surgical History:  Procedure Laterality Date   INDUCED ABORTION     REFRACTIVE SURGERY Bilateral    Family History  Problem Relation Age of Onset   Diabetes Mother    Gestational diabetes Mother        At younger age   Asthma Father    Diabetes Maternal Grandmother    Kidney disease Maternal Grandmother    Gestational diabetes Maternal Grandmother        When younger   Hypertension Paternal Grandfather    Cancer Neg Hx    Social History   Tobacco Use   Smoking status: Never   Smokeless tobacco: Never  Vaping Use   Vaping status: Never Used  Substance Use Topics   Alcohol use: No   Drug use: Not Currently     Types: Marijuana    Comment: last use Aug 2024   Allergies  Allergen Reactions   Pork-Derived Products Other (See Comments)    Life style preference Other reaction(s): Other (See Comments) Life style preference   No medications prior to admission.    I have reviewed patient's Past Medical Hx, Surgical Hx, Family Hx, Social Hx, medications and allergies.   ROS  Pertinent items noted in HPI and remainder of comprehensive ROS otherwise negative.   PHYSICAL EXAM  Patient Vitals for the past 24 hrs:  BP Temp Temp src Pulse Resp SpO2 Height Weight  02/04/23 2012 (!) 136/91 98.4 F (36.9 C) Oral (!) 104 20 -- -- --  02/04/23 1931 130/79 -- -- (!) 106 16 -- -- --  02/04/23 1930 133/82 -- -- (!) 103 -- -- -- --  02/04/23 1828 (!) 142/80 98.3 F (36.8 C) Oral (!) 101 20 96 % -- --  02/04/23 1820 -- -- -- -- -- --  5\' 3"  (1.6 m) 119.9 kg    Constitutional: Well-developed, well-nourished female who appears uncomfortable Cardiovascular: normal rate & rhythm Respiratory: normal effort, no problems with respiration noted GI: Abd soft, non-tender, non-distended, difficult exam secondary to increased body habitus MS: Extremities  Neurologic: Alert and oriented x 4.  GU: no CVA tenderness Pelvic: Deferred     Fetal Tracing: CAT 1 for GA  Baseline: 145 Variability: moderate  Accelerations: present Decelerations: absent Toco: no ctx   Labs: Reviewed    CMP, CBC, PC Ratio WNL for pregnancy    Imaging:  VAS [place] LOWER EXTREMITY VENOUS (DVT) (ONLY [facility] & [facility]) Result Date: 02/04/2023  Lower Venous DVT Study Patient Name:  Kim Crosby  Date of Exam:   02/04/2023 Medical Rec #: [id number]       Accession #:    [id number] Date of Birth: [birth year]       Patient Gender: F Patient Age:   32 years Exam Location:  [facility] Procedure:      VAS [place] LOWER EXTREMITY VENOUS (DVT) Referring Phys: Misty Stanley Darletta Noblett --------------------------------------------------------------------------------   Indications: Pain, and [redacted] weeks pregnant.  Comparison Study: No priors. Performing Technologist: Marilynne Halsted RDMS, RVT  Examination Guidelines: A complete evaluation includes B-mode imaging, spectral Doppler, color Doppler, and power Doppler as needed of all accessible portions of each vessel. Bilateral testing is considered an integral part of a complete examination. Limited examinations for reoccurring indications may be performed as noted. The reflux portion of the exam is performed with the patient in reverse Trendelenburg.  +---------+---------------+---------+-----------+----------+--------------+ RIGHT    CompressibilityPhasicitySpontaneityPropertiesThrombus Aging +---------+---------------+---------+-----------+----------+--------------+ CFV      Full           Yes      Yes                                 +---------+---------------+---------+-----------+----------+--------------+ SFJ      Full                                                        +---------+---------------+---------+-----------+----------+--------------+ FV Prox  Full                                                        +---------+---------------+---------+-----------+----------+--------------+ FV Mid   Full                                                        +---------+---------------+---------+-----------+----------+--------------+ FV DistalFull                                                        +---------+---------------+---------+-----------+----------+--------------+ PFV      Full                                                        +---------+---------------+---------+-----------+----------+--------------+  POP      Full           Yes      Yes                                 +---------+---------------+---------+-----------+----------+--------------+ PTV      Full                                                         +---------+---------------+---------+-----------+----------+--------------+ PERO     Full                                                        +---------+---------------+---------+-----------+----------+--------------+   +---------+---------------+---------+-----------+----------+--------------+ LEFT     CompressibilityPhasicitySpontaneityPropertiesThrombus Aging +---------+---------------+---------+-----------+----------+--------------+ CFV      Full           Yes      Yes                                 +---------+---------------+---------+-----------+----------+--------------+ SFJ      Full                                                        +---------+---------------+---------+-----------+----------+--------------+ FV Prox  Full                                                        +---------+---------------+---------+-----------+----------+--------------+ FV Mid   Full                                                        +---------+---------------+---------+-----------+----------+--------------+ FV DistalFull                                                        +---------+---------------+---------+-----------+----------+--------------+ PFV      Full                                                        +---------+---------------+---------+-----------+----------+--------------+ POP      Full           Yes      Yes                                 +---------+---------------+---------+-----------+----------+--------------+  PTV      Full                                                        +---------+---------------+---------+-----------+----------+--------------+ PERO     Full                                                        +---------+---------------+---------+-----------+----------+--------------+     Summary: BILATERAL: - No evidence of deep vein thrombosis seen in the lower extremities, bilaterally. -No evidence of  popliteal cyst, bilaterally.   *See table(s) above for measurements and observations. Electronically signed by Coral Else MD on 02/04/2023 at 7:27:50 PM.    Final     MDM & MAU COURSE  MDM:  HIGH  I have reviewed the patient chart and performed the physical exam . I have ordered & interpreted the lab results and reviewed and interpreted the imaging results. Medications ordered as stated below.  A/P as described below.  Counseling and education provided and patient agreeable  with plan as described below. Verbalized understanding.  MAU Course: Orders Placed This Encounter  Procedures   Comprehensive metabolic panel   Protein / creatinine ratio, urine   CBC   Discharge patient   VAS [place] LOWER EXTREMITY VENOUS (DVT) (ONLY [facility] & [facility])   Meds ordered this encounter  Medications   acetaminophen (TYLENOL) tablet 1,000 mg   cyclobenzaprine (FLEXERIL) tablet 10 mg   cyclobenzaprine (FLEXERIL) 10 MG tablet    Sig: Take 1 tablet (10 mg total) by mouth 3 (three) times daily as needed for muscle spasms.    Dispense:  30 tablet    Refill:  0    Supervising Provider:   Reva Bores [[id number]]    ASSESSMENT   1. Muscular pain   2. Leg pain, bilateral   3. [redacted] weeks gestation of pregnancy    Muscular skeletal pain associated with pregnancy. No acute findings at this time  PLAN  Discharge home in stable condition with return precautions.  Patient encouraged to keep her prenatal appointments and to use a belly band for support     Follow-up Information     [facility] Follow up.   Specialty: Obstetrics and Gynecology Contact information: [address] [place] [address] [phone]                Allergies as of 02/04/2023       Reactions   Pork-derived Products Other (See Comments)   Life style preference Other reaction(s): Other (See Comments) Life style preference        Medication List      TAKE these medications    acetaminophen 325 MG tablet Commonly known as: Tylenol Take 2 tablets (650 mg total) by mouth every 4 (four) hours as needed (for pain scale < 4).   cyclobenzaprine 10 MG tablet Commonly known as: FLEXERIL Take 1 tablet (10 mg total) by mouth 3 (three) times daily as needed for muscle spasms.   pantoprazole 20 MG tablet Commonly known as: PROTONIX Take 2 tablets (40 mg total) by mouth daily.   Prenatal  27-1 MG Tabs Take 1 tablet by mouth daily.        Marcell Barlow, MSN, [facility], Center for [employer]

## 2023-02-04 NOTE — MAU Note (Signed)
Kim Crosby is a 23 y.o. at [redacted]w[redacted]d here in MAU reporting: excruciating leg pain that feels unbearable. Reports legs hurt sitting, lying, walking, or standing. Denies any abnormal swelling, redness or irritation. States her legs feel itchy at times and is scratching every day. Reports lower back pain that is intermittent. States it sometimes is correlated with the leg pain but not always. States nothing makes it better. Does not have pregnancy support band. Has taken any meds for the pain.  Reports she is concerned about her BP and blurry vision. Reports she wears contacts but not glasses. Pt states google told her blurry vision can mean high BP so she is worried.   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   Denies any abdominal pain, LOF or VB. +FM   Onset of complaint: ongoing  Pain score: leg bilaterally 7   back 6 Vitals:   02/04/23 1828  BP: (!) 142/80  Pulse: (!) 101  Resp: 20  Temp: 98.3 F (36.8 C)  SpO2: 96%

## 2023-02-05 ENCOUNTER — Encounter: Payer: Self-pay | Admitting: Obstetrics and Gynecology

## 2023-02-05 DIAGNOSIS — O163 Unspecified maternal hypertension, third trimester: Secondary | ICD-10-CM | POA: Insufficient documentation

## 2023-02-09 ENCOUNTER — Encounter: Payer: Self-pay | Admitting: Advanced Practice Midwife

## 2023-02-09 ENCOUNTER — Ambulatory Visit: Payer: No Typology Code available for payment source | Admitting: Advanced Practice Midwife

## 2023-02-09 ENCOUNTER — Other Ambulatory Visit: Payer: Self-pay

## 2023-02-09 VITALS — BP 117/80 | HR 124 | Wt 266.0 lb

## 2023-02-09 DIAGNOSIS — O26843 Uterine size-date discrepancy, third trimester: Secondary | ICD-10-CM

## 2023-02-09 DIAGNOSIS — Z23 Encounter for immunization: Secondary | ICD-10-CM

## 2023-02-09 DIAGNOSIS — Z3A30 30 weeks gestation of pregnancy: Secondary | ICD-10-CM

## 2023-02-09 DIAGNOSIS — Z3480 Encounter for supervision of other normal pregnancy, unspecified trimester: Secondary | ICD-10-CM

## 2023-02-09 DIAGNOSIS — N949 Unspecified condition associated with female genital organs and menstrual cycle: Secondary | ICD-10-CM

## 2023-02-09 DIAGNOSIS — R030 Elevated blood-pressure reading, without diagnosis of hypertension: Secondary | ICD-10-CM

## 2023-02-09 LAB — CBC
Hematocrit: 36.1 % (ref 34.0–46.6)
Hemoglobin: 11.6 g/dL (ref 11.1–15.9)
MCH: 26.7 pg (ref 26.6–33.0)
MCHC: 32.1 g/dL (ref 31.5–35.7)
MCV: 83 fL (ref 79–97)
Platelets: 295 10*3/uL (ref 150–450)
RBC: 4.35 x10E6/uL (ref 3.77–5.28)
RDW: 14.6 % (ref 11.7–15.4)
WBC: 8.1 10*3/uL (ref 3.4–10.8)

## 2023-02-09 NOTE — Progress Notes (Unsigned)
       PRENATAL VISIT NOTE- Centering Pregnancy Cycle 16, Session # 5  Subjective:  Kim Crosby is a 23 y.o. [id number] at [redacted]w[redacted]d being seen today for ongoing prenatal care through Centering Pregnancy.  She is currently monitored for the following issues for this low-risk pregnancy and has MDD (major depressive disorder), severe (HCC); Supervision of other normal pregnancy, antepartum; History of gestational diabetes; Obesity affecting pregnancy, antepartum; and Elevated blood pressure affecting pregnancy in third trimester, antepartum on their problem list.  Patient reports  lower abdominal sharp bilateral pain, intermittent .  Contractions: Irritability. Vag. Bleeding: None.  Movement: Present. Denies leaking of fluid/ROM.   The following portions of the patient's history were reviewed and updated as appropriate: allergies, current medications, past family history, past medical history, past social history, past surgical history and problem list. Problem list updated.  Objective:   Vitals:   02/09/23 0920 02/09/23 1116  BP: (!) 123/91 117/80  Pulse: (!) 101 (!) 124  Weight: 266 lb (120.7 kg)     Fetal Status: Fetal Heart Rate (bpm): 143 Fundal Height: 33 cm Movement: Present     General:  Alert, oriented and cooperative. Patient is in no acute distress.  Skin: Skin is warm and dry. No rash noted.   Cardiovascular: Normal heart rate noted  Respiratory: Normal respiratory effort, no problems with respiration noted  Abdomen: Soft, gravid, appropriate for gestational age.  Pain/Pressure: Present     Pelvic: Cervical exam deferred        Extremities: Normal range of motion.  Edema: None  Mental Status: Normal mood and affect. Normal behavior. Normal judgment and thought content.   Assessment and Plan:  Pregnancy: G5P1031 at [redacted]w[redacted]d  1. Supervision of other normal pregnancy, antepartum --Anticipatory guidance about next visits/weeks of pregnancy given.  --Pt missed last 2 Centering  appts, so 28 week labs scheduled, [place] rescheduled, pt to keep future appts  - CBC; Future - Glucose Tolerance, 2 Hours w/1 Hour; Future - HIV Antibody (routine testing w rflx); Future - RPR; Future - Protein / creatinine ratio, urine; Future  2. [redacted] weeks gestation of pregnancy   3. Elevated blood pressure reading without diagnosis of hypertension (Primary) --Elevated BP in MAU on 1/22, retake wnl, initial BP elevated today but retake grossly normal --Pt with some intermittent blurry vision but no scotoma, no epigastric pain --PEC labs today, repeat BP in 3-4 days  - CBC - Comprehensive metabolic panel - Protein / creatinine ratio, urine; Future  4. Uterine size-date discrepancy in third trimester --FH 33 cm today, anatomy US with EFW 45%  --Rescheduled f/u US with MFM  5. Round ligament pain --Rest/ice/heat/warm bath/increase PO fluids/Tylenol/pregnancy support belt    Preterm labor symptoms and general obstetric precautions including but not limited to vaginal bleeding, contractions, leaking of fluid and fetal movement were reviewed in detail with the patient. Please refer to After Visit Summary for other counseling recommendations.  Return for Centering Sessions as scheduled.  Future Appointments  Date Time Provider Department Center  02/12/2023  8:20 AM [facility] LAB [facility]  02/20/2023  1:15 PM [facility] NURSE [facility]  02/20/2023  1:30 PM [facility] US2 [facility]  02/23/2023  9:00 AM CENTERING PROVIDER [facility]  03/09/2023  9:00 AM CENTERING PROVIDER [facility]  03/23/2023  9:00 AM CENTERING PROVIDER [facility]  04/06/2023  9:00 AM CENTERING PROVIDER [facility]  04/20/2023  9:00 AM CENTERING PROVIDER [facility]    Sharen Counter, CNM

## 2023-02-09 NOTE — Progress Notes (Unsigned)
Rescheduled [place] for February 7th at 1315.  Pt notified.

## 2023-02-10 ENCOUNTER — Encounter: Payer: Self-pay | Admitting: Advanced Practice Midwife

## 2023-02-11 ENCOUNTER — Encounter: Payer: Self-pay | Admitting: Lactation Services

## 2023-02-12 ENCOUNTER — Ambulatory Visit (INDEPENDENT_AMBULATORY_CARE_PROVIDER_SITE_OTHER): Payer: No Typology Code available for payment source

## 2023-02-12 ENCOUNTER — Other Ambulatory Visit: Payer: No Typology Code available for payment source

## 2023-02-12 ENCOUNTER — Other Ambulatory Visit: Payer: Self-pay

## 2023-02-12 VITALS — BP 138/86 | HR 105 | Ht 63.0 in | Wt 263.0 lb

## 2023-02-12 DIAGNOSIS — Z0130 Encounter for examination of blood pressure without abnormal findings: Secondary | ICD-10-CM

## 2023-02-12 DIAGNOSIS — Z3480 Encounter for supervision of other normal pregnancy, unspecified trimester: Secondary | ICD-10-CM

## 2023-02-12 DIAGNOSIS — R030 Elevated blood-pressure reading, without diagnosis of hypertension: Secondary | ICD-10-CM

## 2023-02-12 NOTE — Progress Notes (Signed)
Blood Pressure Check Visit  Kim Crosby is here for blood pressure check following her centering session on 02/09/23. BP today is 138/86. Patient denies any dizziness, vision disturbances, headaches, or edema today. Patient is also completing her gtt today. Urine collected for protein/creatinine ratio. Patient has Centering prenatal appointment on 02/23/23. Patient confirmed scheduled appointment.   Quintella Reichert, RN 02/12/2023  11:02 AM

## 2023-02-13 LAB — GLUCOSE TOLERANCE, 2 HOURS W/ 1HR
Glucose, 1 hour: 183 mg/dL — ABNORMAL HIGH (ref 70–179)
Glucose, 2 hour: 181 mg/dL — ABNORMAL HIGH (ref 70–152)
Glucose, Fasting: 94 mg/dL — ABNORMAL HIGH (ref 70–91)

## 2023-02-13 LAB — CBC
Hematocrit: 36.5 % (ref 34.0–46.6)
Hemoglobin: 11.9 g/dL (ref 11.1–15.9)
MCH: 27 pg (ref 26.6–33.0)
MCHC: 32.6 g/dL (ref 31.5–35.7)
MCV: 83 fL (ref 79–97)
NRBC: 1 % — ABNORMAL HIGH (ref 0–0)
Platelets: 328 10*3/uL (ref 150–450)
RBC: 4.41 x10E6/uL (ref 3.77–5.28)
RDW: 14.8 % (ref 11.7–15.4)
WBC: 6.5 10*3/uL (ref 3.4–10.8)

## 2023-02-13 LAB — PROTEIN / CREATININE RATIO, URINE
Creatinine, Urine: 100.1 mg/dL
Protein, Ur: 25.2 mg/dL
Protein/Creat Ratio: 252 mg/g{creat} — ABNORMAL HIGH (ref 0–200)

## 2023-02-13 LAB — RPR: RPR Ser Ql: NONREACTIVE

## 2023-02-13 LAB — HIV ANTIBODY (ROUTINE TESTING W REFLEX): HIV Screen 4th Generation wRfx: NONREACTIVE

## 2023-02-17 ENCOUNTER — Encounter: Payer: Self-pay | Admitting: Advanced Practice Midwife

## 2023-02-17 ENCOUNTER — Other Ambulatory Visit: Payer: Self-pay | Admitting: Advanced Practice Midwife

## 2023-02-17 ENCOUNTER — Ambulatory Visit: Payer: Self-pay

## 2023-02-17 DIAGNOSIS — Z8669 Personal history of other diseases of the nervous system and sense organs: Secondary | ICD-10-CM

## 2023-02-17 DIAGNOSIS — H43393 Other vitreous opacities, bilateral: Secondary | ICD-10-CM

## 2023-02-17 NOTE — Progress Notes (Signed)
 I called Ms. Fossett regarding her 2 hour GTT results and her recent phone call with vision changes.  Pt reports h/a yesterday resolved with Tylenol , but no h/a today.  Vision changes started yesterday with floaters, moving spots in her field of vision. She has hx retinal detachment and surgery.  She was encouraged by RN call service to go to the regular ED for eye evaluation. Pt reports the floaters are stable right now.  I agree with the recommendation for swift evaluation of the eye changes.  Pt did have elevated BP at last prenatal visit with Centering, but retake was normal, and office visit 2 days later with BP check was normal.  PEC labs normal, with P/C ratio below threshold for PEC but slightly elevated.  If pt h/a returns and is not resolved with Tylenol , or if she has scotoma or similar vision changes, or epigastric pain, I recommend she present to the MAU.  If the floaters worsen, or other vision changes occur that are less consistent with PEC, I agree with the plan for the main [facility].  Alternatively, the patient could go to a walk in optometrist for an eye exam tomorrow morning and have someone look at her retina.  I will put in a referral for ophthalmology now as well so she has specialist follow up.  I will also have the office sent a prescription for BP cuff since pt now has Medicaid coverage, and send the appropriate meter for her GDM.  PEC/emergency precautions/reasons to seek care reviewed.   Pt has Centering prenatal appt on Monday 02/23/23 and is to keep this appt.

## 2023-02-17 NOTE — Telephone Encounter (Signed)
 Chief Complaint: Eye floaters Symptoms: Eye floaters Frequency: Off and on/daily Pertinent Negatives: Patient denies eye pain, blurry vision, injury, numbness/weakness, headache, fever, HTN Disposition: [x] ED /[] Urgent Care (no appt availability in office) / [] Appointment(In office/virtual)/ []  [facility]/ [] Home Care/ [x] Refused Recommended Disposition /[] [facility]/ []  Follow-up with PCP Additional Notes: Patient called with complaints of eye floaters that started about two days ago. 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 Patient states that vision is clear except for the small black and blue spots. Blinking or rubbing of the eyes help sometimes to temporarily relieve symptoms, but floaters eventually return. Patient denies injury, foreign object, eye pain, numbness/weakness, HTN, headache, light flashes, and dry eye. Patient wears contacts and states floaters are present with or without contacts and are worse in the left eye. Patient advised by this RN to go to ED per protocol to which patient refused at this time due to not wanting to wait in the waiting room or be sent to LD. Patient advised by this RN to go to ED or call back with worsening symptoms. Patient verbalized understanding   Reason for Disposition  Many floaters in the eye  Answer Assessment - Initial Assessment Questions 1. DESCRIPTION: How has your vision changed? (e.g., complete vision loss, blurred vision, double vision, floaters, etc.)     Eye floaters 2. LOCATION: One or both eyes? If one, ask: Which eye?     Both eyes, worse in left eye. 3. SEVERITY: Can you see anything? If Yes, ask: What can you see? (e.g., fine print)     I can see fine I just have floaters in my eyes that come and go multiple times a day. The floaters look like black and blue spots  mostly. 4. ONSET: When did this begin? Did it start suddenly (minutes, hours) or has this been gradual (weeks, months)?     A couple of days ago 5. PATTERN: Does this come and go, or has it been constant since it started?     Off and on  6. PAIN: Is there any pain in your eye(s)?  (Scale 1-10; or mild, moderate, severe)   - NONE (0): No pain.   - MILD (1-3): Doesn't interfere with normal activities.   - MODERATE (4-7): Interferes with normal activities or awakens from sleep.    - SEVERE (8-10): Excruciating pain, unable to do any normal activities.     Denies 7. CONTACTS-GLASSES: Do you wear contacts or glasses?     Contacts 8. CAUSE: What do you think is causing this visual problem?     I have retinal issues, I've had them in the past. I also have gestational prediabetes and the doctor thought I had preeclampsia but my blood pressure has gone down. 9. OTHER SYMPTOMS: Do you have any other symptoms? (e.g., arm or leg weakness, dry eyes, headache)     I had headaches in the past like for the past couple weeks; yesterday morning I had a really bad headache for like a few hours and I woke up with floaters but the headache is gone. 10. PREGNANCY: How many weeks pregnant are you?       31 weeks 11. EDD: What date are you expecting to deliver?       04/18/23  Protocols used: Pregnancy - Vision Loss or Change-A-AH

## 2023-02-18 NOTE — BH Specialist Note (Signed)
Integrated Behavioral Health via Telemedicine Visit  02/26/2023 [name] [id number]  Number of Integrated Behavioral Health Clinician visits: 1- Initial Visit  Session Start time: [phone]   Session End time: 0949  Total time in minutes: 33   Referring Provider: Lorriane Shire, MD Patient/Family location: Home [facility] Provider location: Center for [facility]  All persons participating in visit: Patient Kim Crosby and [facility] Alexya Mcdaris   Types of Service: Individual psychotherapy and Video visit  I connected with Shakedra M Mcinroy and/or Myrl M Lubben's  n/a  via  Telephone or Video Enabled Telemedicine Application  (Video is Caregility application) and verified that I am speaking with the correct person using two identifiers. Discussed confidentiality: Yes   I discussed the limitations of telemedicine and the availability of in person appointments.  Discussed there is a possibility of technology failure and discussed alternative modes of communication if that failure occurs.  I discussed that engaging in this telemedicine visit, they consent to the provision of behavioral healthcare and the services will be billed under their insurance.  Patient and/or legal guardian expressed understanding and consented to Telemedicine visit: Yes   Presenting Concerns: Patient and/or family reports the following symptoms/concerns: Feeling overwhelmed and depressed; attributes to  working full-time, going to school full-time, while managing additional medical appointments in pregnancy and caring for 23yo. 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 Pt previously took Zoloft; interested in restarting; begins seeing new ongoing therapist  tomorrow. Duration of problem: Ongoing with increase perinatal; Severity of problem: moderate  Patient and/or Family's Strengths/Protective Factors: Social connections and Sense of purpose  Goals Addressed: Patient will:  Reduce symptoms of: anxiety, depression, and stress   Increase knowledge and/or ability of: healthy habits   Demonstrate ability to: Increase healthy adjustment to current life circumstances and Increase adequate support systems for patient/family  Progress towards Goals: Ongoing  Interventions: Interventions utilized:  Psychoeducation and/or Health Education, Link to [employer], and Supportive Reflection Standardized Assessments completed: Not Needed  Patient and/or Family Response: Patient agrees with treatment plan.   Assessment: Patient currently experiencing Major depressive disorder, recurrent, moderate.   Patient may benefit from psychoeducation and brief therapeutic interventions regarding coping with symptoms of depression, anxiety .  Plan: Follow up with behavioral health clinician on : Call Shahrzad Koble at [phone], as needed. Behavioral recommendations:  -Continue taking prenatal vitamin as recommended by medical provider -Discuss resuming Zoloft at upcoming medical appointment with medical provider -Continue prioritizing healthy self-care (regular meals, adequate rest; allowing practical help from supportive friends and family) through pregnancy and to at least postpartum medical appointment -Consider new mom support group as needed at either [email or web address] or [email or web address]  -Continue plan to set up discussion with August Saucer of [employer] at [employer] [facility] [employer], to come up with a workable plan to complete classes -Continue plan to attend initial therapy appointment with [facility] group -Read through information on After Visit Summary; use as needed and discussed  Referral(s): Integrated Art gallery manager (In Clinic) and [employer]:   Childcare; New parent support; Postpartum planner  I discussed the assessment and treatment plan with the patient and/or parent/guardian. They were provided an opportunity to ask questions and all were answered. They agreed with the plan and demonstrated an understanding of the instructions.  They were advised to call back or seek an in-person evaluation if the symptoms worsen or if the condition fails to improve as anticipated.  Rae Lips, LCSW     10/09/2022    2:45 PM 07/28/2022    2:08 PM 01/27/2022   11:01 AM 09/27/2019    3:32 PM 05/04/2019    8:47 AM  Depression screen PHQ 2/9  Decreased Interest 1 1 1  0 1  Down, Depressed, Hopeless 1 1 1 1 1   PHQ - 2 Score 2 2 2 1 2   Altered sleeping 3 2 2  2   Tired, decreased energy 3 2 2  2   Change in appetite 1 2 2   0  Feeling bad or failure about yourself  1 2 0  1  Trouble concentrating  0 1  1  Moving slowly or fidgety/restless 0 0 0  0  Suicidal thoughts 0 0 0  0  PHQ-9 Score 10 10 9  8   Difficult doing work/chores     Not difficult at all      10/09/2022    2:46 PM 07/28/2022    2:08 PM 01/27/2022   11:01 AM 05/04/2019    8:47 AM  GAD 7 : Generalized Anxiety Score  Nervous, Anxious, on Edge 1 3 1 1   Control/stop worrying 1 3 1 1   Worry too much - different things 1 3 1 1   Trouble relaxing 0 2 1 0  Restless 0 0 0 0  Easily annoyed or irritable 1 2 1 1   Afraid - awful might happen 0 0 0 1  Total GAD 7 Score 4 13 5 5   Anxiety Difficulty    Not difficult at all

## 2023-02-20 ENCOUNTER — Other Ambulatory Visit: Payer: Self-pay | Admitting: *Deleted

## 2023-02-20 ENCOUNTER — Ambulatory Visit (HOSPITAL_BASED_OUTPATIENT_CLINIC_OR_DEPARTMENT_OTHER): Payer: Medicaid Other | Admitting: Maternal & Fetal Medicine

## 2023-02-20 ENCOUNTER — Other Ambulatory Visit: Payer: Self-pay | Admitting: Maternal & Fetal Medicine

## 2023-02-20 ENCOUNTER — Ambulatory Visit: Payer: Medicaid Other | Attending: Maternal & Fetal Medicine | Admitting: *Deleted

## 2023-02-20 ENCOUNTER — Ambulatory Visit: Payer: Medicaid Other

## 2023-02-20 VITALS — BP 142/86 | HR 120

## 2023-02-20 DIAGNOSIS — O24419 Gestational diabetes mellitus in pregnancy, unspecified control: Secondary | ICD-10-CM

## 2023-02-20 DIAGNOSIS — Z3A31 31 weeks gestation of pregnancy: Secondary | ICD-10-CM | POA: Insufficient documentation

## 2023-02-20 DIAGNOSIS — O99213 Obesity complicating pregnancy, third trimester: Secondary | ICD-10-CM

## 2023-02-20 DIAGNOSIS — O402XX0 Polyhydramnios, second trimester, not applicable or unspecified: Secondary | ICD-10-CM | POA: Diagnosis not present

## 2023-02-20 DIAGNOSIS — Z3480 Encounter for supervision of other normal pregnancy, unspecified trimester: Secondary | ICD-10-CM

## 2023-02-20 DIAGNOSIS — O09293 Supervision of pregnancy with other poor reproductive or obstetric history, third trimester: Secondary | ICD-10-CM

## 2023-02-20 DIAGNOSIS — O99212 Obesity complicating pregnancy, second trimester: Secondary | ICD-10-CM

## 2023-02-20 DIAGNOSIS — O133 Gestational [pregnancy-induced] hypertension without significant proteinuria, third trimester: Secondary | ICD-10-CM

## 2023-02-20 DIAGNOSIS — O403XX0 Polyhydramnios, third trimester, not applicable or unspecified: Secondary | ICD-10-CM | POA: Diagnosis not present

## 2023-02-20 DIAGNOSIS — Z362 Encounter for other antenatal screening follow-up: Secondary | ICD-10-CM

## 2023-02-20 NOTE — Progress Notes (Signed)
 Patient information  Patient Name: Kim Crosby  Patient MRN:   [id number]  Referring practice: MFM Referring Provider: [facility]  MFM CONSULT  Kim Crosby is a 23 y.o. [phone] at [redacted]w[redacted]d here for ultrasound and consultation. Patient Active Problem List   Diagnosis Date Noted   Elevated blood pressure affecting pregnancy in third trimester, antepartum 02/05/2023   Obesity affecting pregnancy, antepartum 11/10/2022   History of gestational diabetes 10/09/2022   Supervision of other normal pregnancy, antepartum 10/02/2022   MDD (major depressive disorder), severe (HCC) 02/23/2022   Polyhydramnios affecting pregnancy in third trimester 10/19/2019   Gestational diabetes mellitus in pregnancy, unspecified control 08/13/2019    Kim Crosby is doing well today with no acute concerns. She denies contractions, bleeding, or loss of fluid and reports good fetal movement.   RE diagnosed gestational diabetes: The patient was just diagnosed with gestational diabetes and is going to pick up her blood sugar supplies on the way home.  I reviewed the importance of proper glycemic control in pregnancy and reviewed the blood sugar goals during pregnancy.  Also reviewed the typical prenatal course includes serial growth ultrasounds as well as antenatal testing if medication is required.  Gestational timing of delivery is typically around 39 weeks or sooner if indicated.  Due to the presence of mild polyhydramnios today I recommend we did a biophysical profile which was normal.  RE polyhydramnios: I reviewed the diagnosis, etiology and management of polyhydramnios during pregnancy.  This is the most common cause is usually idiopathic followed by gestational diabetes.  There is no evidence of structural/anatomic abnormalities in the fetus although these can often be difficult to detect prenatally.  I discussed that this can be a sign of poor glucose control and she needs to  start checking her blood sugars this and she receives her supplies.   Sonographic findings Single intrauterine pregnancy. Fetal cardiac activity: Observed. Presentation: Cephalic. Interval fetal anatomy appears normal. Fetal biometry shows the estimated fetal weight at the 49 percentile.  Amniotic fluid: Subjectively upper-normal.  MVP: 9.61 cm. Placenta: Anterior. BPP: 8/8.   There are limitations of prenatal ultrasound such as the inability to detect certain abnormalities due to poor visualization. Various factors such as fetal position, gestational age and maternal body habitus may increase the difficulty in visualizing the fetal anatomy.    Recommendations -Serial ultrasounds every 4 to 6 weeks or sooner if indicated -Antenatal testing weekly as long as she has signs of uncontrolled diabetes such as polyhydramnios.  If in 1 week her blood sugars are well-controlled she can discontinue antenatal testing -Delivery timing pending clinical course but likely around 39 weeks or sooner if indicated.  Review of Systems: A review of systems was performed and was negative except per HPI   Vitals and Physical Exam    02/20/2023    2:33 PM 02/20/2023    1:47 PM 02/12/2023   10:54 AM  Vitals with BMI  Height   5' 3  Weight   263 lbs  BMI   46.6  Systolic 129 142 861  Diastolic 80 86 86  Pulse 121 120 105    Sitting comfortably on the sonogram table Nonlabored breathing Normal rate and rhythm Abdomen is nontender  Past pregnancies OB History  Gravida Para Term Preterm AB Living  5 1 1  3 1   SAB IAB Ectopic Multiple Live Births  1 2  0 1    # Outcome Date  GA Lbr Len/2nd Weight Sex Type Anes PTL Lv  5 Current           4 IAB 11/08/21          3 SAB 04/13/21          2 Term 10/31/19 [redacted]w[redacted]d 23:28 / 01:32 8 lb 6.9 oz (3.824 kg) M Vag-Spont EPI  LIV  1 IAB              I spent 20 minutes reviewing the patients chart, including labs and images as well as counseling the patient about  her medical conditions. Greater than 50% of the time was spent in direct face-to-face patient counseling.  Kim Crosby  MFM, [place]   02/20/2023  4:11 PM

## 2023-02-23 ENCOUNTER — Encounter: Payer: No Typology Code available for payment source | Admitting: Advanced Practice Midwife

## 2023-02-23 ENCOUNTER — Telehealth: Payer: Self-pay

## 2023-02-23 MED ORDER — ACCU-CHEK GUIDE W/DEVICE KIT: 1.0000 | PACK | 0 refills | Status: DC | PRN

## 2023-02-23 MED ORDER — BLOOD PRESSURE KIT DEVI
1.0000 | Freq: Two times a day (BID) | 0 refills | Status: DC | PRN
Start: 2023-02-23 — End: 2023-03-30

## 2023-02-23 MED ORDER — ACCU-CHEK SOFTCLIX LANCETS MISC: 12 refills | Status: DC

## 2023-02-23 MED ORDER — ACCU-CHEK GUIDE TEST VI STRP: ORAL_STRIP | 12 refills | Status: DC

## 2023-02-23 NOTE — Telephone Encounter (Signed)
 Called pt and pt stated that she apologized that she missed Centering today as she was intending on calling to reschedule.  Pt reports that she is unable to attend Centering because of the hours being in the am.  Pt agrees to traditional scheduling and appt on 02/27/23 at 1015 for OB appt.     Garland Smouse,RN

## 2023-02-25 ENCOUNTER — Other Ambulatory Visit: Payer: Self-pay | Admitting: *Deleted

## 2023-02-25 DIAGNOSIS — O24419 Gestational diabetes mellitus in pregnancy, unspecified control: Secondary | ICD-10-CM

## 2023-02-26 ENCOUNTER — Ambulatory Visit (INDEPENDENT_AMBULATORY_CARE_PROVIDER_SITE_OTHER): Payer: Self-pay | Admitting: Clinical

## 2023-02-26 ENCOUNTER — Ambulatory Visit (HOSPITAL_BASED_OUTPATIENT_CLINIC_OR_DEPARTMENT_OTHER): Payer: Medicaid Other | Admitting: *Deleted

## 2023-02-26 ENCOUNTER — Other Ambulatory Visit: Payer: Self-pay

## 2023-02-26 ENCOUNTER — Ambulatory Visit: Payer: Medicaid Other | Attending: Obstetrics and Gynecology | Admitting: *Deleted

## 2023-02-26 ENCOUNTER — Encounter: Payer: Medicaid Other | Attending: Family Medicine | Admitting: Dietician

## 2023-02-26 ENCOUNTER — Ambulatory Visit: Payer: Medicaid Other

## 2023-02-26 VITALS — BP 149/90

## 2023-02-26 DIAGNOSIS — E669 Obesity, unspecified: Secondary | ICD-10-CM | POA: Diagnosis not present

## 2023-02-26 DIAGNOSIS — O24419 Gestational diabetes mellitus in pregnancy, unspecified control: Secondary | ICD-10-CM | POA: Diagnosis present

## 2023-02-26 DIAGNOSIS — Z3A32 32 weeks gestation of pregnancy: Secondary | ICD-10-CM

## 2023-02-26 DIAGNOSIS — Z713 Dietary counseling and surveillance: Secondary | ICD-10-CM | POA: Insufficient documentation

## 2023-02-26 DIAGNOSIS — Z3480 Encounter for supervision of other normal pregnancy, unspecified trimester: Secondary | ICD-10-CM

## 2023-02-26 DIAGNOSIS — F331 Major depressive disorder, recurrent, moderate: Secondary | ICD-10-CM

## 2023-02-26 NOTE — Progress Notes (Signed)
Patient was seen for Gestational Diabetes on 02/26/2023  Start time 1120 and End time 1205   Estimated due date: 04/18/2023; [redacted]w[redacted]d  Clinical: Medications:  Current Outpatient Medications:    Accu-Chek Softclix Lancets lancets, Use as instructed, Disp: 100 each, Rfl: 12   acetaminophen (TYLENOL) 325 MG tablet, Take 2 tablets (650 mg total) by mouth every 4 (four) hours as needed (for pain scale < 4)., Disp: , Rfl:    Blood Glucose Monitoring Suppl (ACCU-CHEK GUIDE) w/Device KIT, 1 Device by Does not apply route as needed., Disp: 1 kit, Rfl: 0   glucose blood (ACCU-CHEK GUIDE TEST) test strip, Use as instructed, Disp: 100 each, Rfl: 12   Prenatal 27-1 MG TABS, Take 1 tablet by mouth daily., Disp: 30 tablet, Rfl: 11   Blood Pressure Monitoring (BLOOD PRESSURE KIT) DEVI, 1 Device by Does not apply route 3 times/day as needed-between meals & bedtime. (Patient not taking: Reported on 02/26/2023), Disp: 1 each, Rfl: 0   cyclobenzaprine (FLEXERIL) 10 MG tablet, Take 1 tablet (10 mg total) by mouth 3 (three) times daily as needed for muscle spasms. (Patient not taking: Reported on 02/26/2023), Disp: 30 tablet, Rfl: 0   pantoprazole (PROTONIX) 20 MG tablet, Take 2 tablets (40 mg total) by mouth daily. (Patient not taking: Reported on 02/26/2023), Disp: 60 tablet, Rfl: 3  Medical History:  Past Medical History:  Diagnosis Date   Anemia    Anxiety    Bipolar 2 disorder (HCC)    Depression    Gestational diabetes    Retinitis     Labs: OGTT fasting 94, 1 hour 183, 2 181  Lab Results  Component Value Date   HGBA1C 5.5 10/09/2022   Dietary and Lifestyle History: Pt presents today with meter and supplies. Pt reports shared shopping and cooking with her boyfriend. Pt reports she avoids pork. 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 Pt reports milk causes he upset stomach including bowel movement and is open to trying almond milk. Pt reports she eating out frequently and would like to cook at home more often.  All Pt's questions were answered during this encounter.    Physical Activity: walking 10 minutes 4 days Stress: 7 out of 10 /self care includes potential ideas of hobbies Sleep: ok  24 hr Recall:  First Meal: jack dinner dinner beef, peppers, onions, egg omelet, large biscuit, un-sweet apple sauce (~10a) Snack: apple Second meal: [place] salad bowl with steak, cheese, corn, small amounts of bean and rice, water Snack: none Third meal: green beans, collard greens, meatloaf, sweet potatoes, water Snack: none Beverages: water, diet lemonade,    NUTRITION  INTERVENTION  Nutrition education (E-1) on the following topics:   Initial Follow-up  [x]  []  Definition of Gestational Diabetes [x]  []  Why dietary management is important in controlling blood glucose [x]  []  Effects each nutrient has on blood glucose levels [x]  []  Simple carbohydrates vs complex carbohydrates [x]  []  Fluid intake [x]  []  Creating a balanced meal plan [x]  []  Carbohydrate counting  [x]  []  When to check blood glucose levels [x]  []  Proper blood glucose monitoring techniques [x]  []  Effect of stress and stress reduction techniques  [x]  []  Exercise effect on blood glucose levels, appropriate exercise during pregnancy [x]  []  Importance of limiting caffeine and abstaining from alcohol and smoking [x]  []  Medications used for blood sugar control during pregnancy [x]  []  Hypoglycemia and rule of 15 [x]  []  Postpartum self care   Patient has a meter prior to visit. Patient is instructed  to begin testing pre breakfast and 2 hours after each meal. CBG: 136 mg/dL, reported as 2 hour post prandial per Pt  Patient instructed to monitor glucose levels: QID FBS: 60 - <= 95 mg/dL; 2 hour: <= 540 mg/dL  Patient received handouts: Nutrition Diabetes and Pregnancy Carbohydrate Counting List Blood glucose log Snack ideas for diabetes during pregnancy  Patient will be seen for follow-up as needed.

## 2023-02-26 NOTE — Procedures (Signed)
Kim Crosby 02/14/2000 [redacted]w[redacted]d  Fetus A Non-Stress Test Interpretation for 02/26/23   NST only  Indication: Diabetes  Fetal Heart Rate A Mode: External Baseline Rate (A): 140 bpm Variability: Moderate Accelerations: 15 x 15 Decelerations: None Multiple birth?: No  Uterine Activity Mode: Palpation, Toco Contraction Frequency (min): none Resting Tone Palpated: Relaxed  Interpretation (Fetal Testing) Nonstress Test Interpretation: Reactive Overall Impression: Reassuring for gestational age Comments: Dr. Darra Lis reviewed tracing

## 2023-02-26 NOTE — Patient Instructions (Addendum)
Center for [facility] at [facility] [address] [place], [place] [address] [phone] (main office) [phone] (Devota Viruet's office)  [place] Child Counsellor  (Childcare options, Early childcare development, etc.) DietDisorder.cz  [employer] Child Care Facility Search Engine  https://ncchildcare.http://cook.com/  New Parent Support Groups www.postpartum.net www.conehealthybaby.com     BRAINSTORMING  Develop a Plan Goals: Provide a way to start conversation about your new life with a baby Assist parents in recognizing and using resources within their reach Help pave the way before birth for an easier period of transition afterwards.  Make a list of the following information to keep in a central location: Full name of Mom and Partner: _____________________________________________ Baby's full name and Date of Birth: ___________________________________________ Home Address: ___________________________________________________________ ________________________________________________________________________ Home Phone: ____________________________________________________________ Parents' cell numbers: _____________________________________________________ ________________________________________________________________________ Name and contact info for OB: ______________________________________________ Name and contact info for Pediatrician:________________________________________ Contact info for Lactation Consultants: ________________________________________  REST and SLEEP *You each need at least 4-5 hours of uninterrupted sleep every day. Write specific names and contact information.* How are you going to rest in the postpartum period? While partner's home? When partner returns to work? When you both return to work? Where will your baby sleep? Who is available to help during the day? Evening? Night? Who could move in for a  period to help support you? What are some ideas to help you get enough sleep? __________________________________________________________________________________________________________________________________________________________________________________________________________________________________________ NUTRITIOUS FOOD AND DRINK *Plan for meals before your baby is born so you can have healthy food to eat during the immediate postpartum period.* Who will look after breakfast? Lunch? Dinner? List names and contact information. Brainstorm quick, healthy ideas for each meal. What can you do before baby is born to prepare meals for the postpartum period? How can others help you with meals? Which grocery stores provide online shopping and delivery? Which restaurants offer take-out or delivery options? ______________________________________________________________________________________________________________________________________________________________________________________________________________________________________________________________________________________________________________________________________________________________________________________________________  CARE FOR MOM *It's important that mom is cared for and pampered in the postpartum period. Remember, the most important ways new mothers need care are: sleep, nutrition, gentle exercise, and time off.* Who can come take care of mom during this period? Make a list of people with their contact information. List some activities that make you feel cared for, rested, and energized? Who can make sure you have opportunities to do these things? Does mom have a space of her very own within your home that's just for her? Make a "[facility]" where she can be comfortable, rest, and renew herself  daily. ______________________________________________________________________________________________________________________________________________________________________________________________________________________________________________________________________________________________________________________________________________________________________________________________________    CARE FOR AND FEEDING BABY *Knowledgeable and encouraging people will offer the best support with regard to feeding your baby.* Educate yourself and choose the best feeding option for your baby. Make a list of people who will guide, support, and be a resource for you as your care for and feed your baby. (Friends that have breastfed or are currently breastfeeding, lactation consultants, breastfeeding support groups, etc.) Consider a postpartum doula. (These websites can give you information: dona.org & https://shea.org/) Seek out local breastfeeding resources like the breastfeeding support group at [employer] or [employer]. ______________________________________________________________________________________________________________________________________________________________________________________________________________________________________________________________________________________________________________________________________________________________________________________________________  Judson Roch AND ERRANDS Who can help with a thorough cleaning before baby is born? Make a list of people who will help with housekeeping and chores, like laundry, light cleaning, dishes, bathrooms, etc. Who can run some errands for you? What can you do to make sure you are stocked with basic supplies before baby is born? Who is going to do the  shopping? ______________________________________________________________________________________________________________________________________________________________________________________________________________________________________________________________________________________________________________________________________________________________________________________________________  Family Adjustment *Nurture yourselves.it helps parents be more loving and allows for better bonding with their child.* What sorts of things do you and partner enjoy doing together? Which activities help you to connect and strengthen your relationship? Make a list of those things. Make a list of people whom you trust to care for your baby so you can have some time together as a couple. What types of things help partner feel connected to Mom? Make a list. What needs will partner have in order to bond with baby? Other children? Who will care for them when you go into labor and while you are in the hospital? Think about what the needs of your older children might be. Who can help you meet those needs? In what ways are you helping them prepare for bringing baby home? List some specific strategies you have for family adjustment. _______________________________________________________________________________________________________________________________________________________________________________________________________________________________________________________________________________________________________________________________________________  SUPPORT *Someone who can empathize with experiences normalizes your problems and makes them more bearable.* Make a list of other friends, neighbors, and/or co-workers you know with infants (and small children, if applicable) with whom you can connect. Make a list of local or online support groups, mom groups, etc. in which you can be  involved. ______________________________________________________________________________________________________________________________________________________________________________________________________________________________________________________________________________________________________________________________________________________________________________________________________  Childcare Plans Investigate and plan for childcare if mom is returning to work. Talk about mom's concerns about her transition back to work. Talk about partner's concerns regarding this transition.  Mental Health *Your mental health is one of the highest priorities for a pregnant or postpartum mom.* 1 in 5 women experience anxiety and/or depression from the time of conception through the first year after birth. Postpartum Mood Disorders are the #1 complication of pregnancy and childbirth and the suffering experienced by these mothers is not necessary! These illnesses are temporary and respond well to treatment, which often includes self-care, social support, talk therapy, and medication when needed. Women experiencing anxiety and depression often say things like: "I'm supposed to be happy.why do I feel so sad?", "Why can't I snap out of it?", "I'm having thoughts that scare me." There is no need to be embarrassed if you are feeling these symptoms: Overwhelmed, anxious, angry, sad, guilty, irritable, hopeless, exhausted but can't sleep You are NOT alone. You are NOT to blame. With help, you WILL be well. Where can I find help? Medical professionals such as your OB, midwife, gynecologist, family practitioner, primary care provider, pediatrician, or mental health providers; [facility] support groups: Feelings After Birth, Breastfeeding Support Group, Baby and Me Group, and Fit 4 Two exercise classes. You have permission to ask for help. It will confirm your feelings, validate your experiences,  share/learn coping strategies, and gain support and encouragement as you heal. You are important! BRAINSTORM Make a list of local resources, including resources for mom and for partner. Identify support groups. Identify people to call late at night - include names and contact info. Talk with partner about perinatal mood and anxiety disorders. Talk with your OB, midwife, and doula about baby blues and about perinatal mood and anxiety disorders. Talk with your pediatrician about perinatal mood and anxiety disorders.   Support & Sanity Savers   What do you really need?  Basics In preparing for a new baby, many expectant parents spend hours shopping for baby clothes, decorating the nursery, and deciding which car seat to buy. Yet most don't think much about what the reality of parenting a newborn will be like, and what they need to make it through that. So, here is the advice of  experienced parents. We know you'll read this, and think "they're exaggerating, I don't really need that." Just trust [place] on these, OK? Plan for all of this, and if it turns out you don't need it, come back and teach [place] how you did it!  Must-Haves (Once baby's survival needs are met, make sure you attend to your own survival needs!) Sleep An average newborn sleeps 16-18 hours per day, over 6-7 sleep periods, rarely more than three hours at a time. It is normal and healthy for a newborn to wake throughout the night... but really hard on parents!! Naps. Prioritize sleep above any responsibilities like: cleaning house, visiting friends, running errands, etc.  Sleep whenever baby sleeps. If you can't nap, at least have restful times when baby eats. The more rest you get, the more patient you will be, the more emotionally stable, and better at solving problems.  Food You may not have realized it would be difficult to eat when you have a newborn. Yet, when we talk to countless new parents, they say things like "it may be 2:00 pm  when I realize I haven't had breakfast yet." Or "every time we sit down to dinner, baby needs to eat, and my food gets cold, so I don't bother to eat it." Finger food. Before your baby is born, stock up with one months' worth of food that: 1) you can eat with one hand while holding a baby, 2) doesn't need to be prepped, 3) is good hot or cold, 4) doesn't spoil when left out for a few hours, and 5) you like to eat. Think about: nuts, dried fruit, Clif bars, pretzels, jerky, gogurt, baby carrots, apples, bananas, crackers, cheez-n-crackers, string cheese, hot pockets or frozen burritos to microwave, garden burgers and breakfast pastries to put in the toaster, yogurt drinks, etc. Restaurant Menus. Make lists of your favorite restaurants & menu items. When family/friends want to help, you can give specific information without much thought. They can either bring you the food or send gift cards for just the right meals. Freezer Meals.  Take some time to make a few meals to put in the freezer ahead of time.  Easy to freeze meals can be anything such as soup, lasagna, chicken pie, or spaghetti sauce. Set up a Meal Schedule.  Ask friends and family to sign up to bring you meals during the first few weeks of being home. (It can be passed around at baby showers!) You have no idea how helpful this will be until you are in the throes of parenting.  MachineLive.it is a great website to check out. Emotional Support Know who to call when you're stressed out. Parenting a newborn is very challenging work. There are times when it totally overwhelms your normal coping abilities. EVERY NEW PARENT NEEDS TO HAVE A PLAN FOR WHO TO CALL WHEN THEY JUST CAN'T COPE ANY MORE. (And it has to be someone other than the baby's other parent!) Before your baby is born, come up with at least one person you can call for support - write their phone number down and post it on the refrigerator. Anxiety & Sadness. Baby blues are normal after  pregnancy; however, there are more severe types of anxiety & sadness which can occur and should not be ignored.  They are always treatable, but you have to take the first step by reaching out for help. [facility] offers a "Mom Talk" group which meets every Tuesday from 10 am - 11 am.  This group is for new moms who need support and connection after their babies are born.  Call [phone].  Really, Really Helpful (Plan for them! Make sure these happen often!!) Physical Support with Taking Care of Yourselves Asking friends and family. Before your baby is born, set up a schedule of people who can come and visit and help out (or ask a friend to schedule for you). Any time someone says "let me know what I can do to help," sign them up for a day. When they get there, their job is not to take care of the baby (that's your job and your joy). Their job is to take care of you!  Postpartum doulas. If you don't have anyone you can call on for support, look into postpartum doulas:  professionals at helping parents with caring for baby, caring for themselves, getting breastfeeding started, and helping with household tasks. www.padanc.org is a helpful website for learning about doulas in our area. Peer Support / Parent Groups Why: One of the greatest ideas for new parents is to be around other new parents. Parent groups give you a chance to share and listen to others who are going through the same season of life, get a sense of what is normal infant development by watching several babies learn and grow, share your stories of triumph and struggles with empathetic ears, and forgive your own mistakes when you realize all parents are learning by trial and error. Where to find: There are many places you can meet other new parents throughout our community.  [facility] offers the following classes for new moms and their little ones:  Baby and Me (Birth to Crawling) and Breastfeeding Support Group. Go to  www.conehealthybaby.com  Time for your Relationship It's easy to get so caught up in meeting baby's immediate needs that it's hard to find time to connect with your partner, and meet the needs of your relationship. It's also easy to forget what "quality time with your partner" actually looks like. If you take your baby on a date, you'd be amazed how much of your couple time is spent feeding the baby, diapering the baby, admiring the baby, and talking about the baby. Dating: Try to take time for just the two of you. Babysitter tip: Sometimes when moms are breastfeeding a newborn, they find it hard to figure out how to schedule outings around baby's unpredictable feeding schedules. Have the babysitter come for a three hour period. When she comes over, if baby has just eaten, you can leave right away, and come back in two hours. If baby hasn't fed recently, you start the date at home. Once baby gets hungry and gets a good feeding in, you can head out for the rest of your date time. Date Nights at Home: If you can't get out, at least set aside one evening a week to prioritize your relationship: whenever baby dozes off or doesn't have any immediate needs, spend a little time focusing on each other. Potential conflicts: The main relationship conflicts that come up for new parents are: issues related to sexuality, financial stresses, a feeling of an unfair division of household tasks, and conflicts in parenting styles. The more you can work on these issues before baby arrives, the better!  Fun and Frills (Don't forget these. and don't feel guilty for indulging in them!) Everyone has something in life that is a fun little treat that they do just for themselves. It may be: reading the morning paper, or going  for a daily jog, or having coffee with a friend once a week, or going to a movie on Friday nights, or fine chocolates, or bubble baths, or curling up with a good book. Unless you do fun things for yourself every  now and then, it's hard to have the energy for fun with your baby. Whatever your "special" treats are, make sure you find a way to continue to indulge in them after your baby is born. These special moments can recharge you, and allow you to return to baby with a new joy   PERINATAL MOOD DISORDERS: MATERNAL MENTAL HEALTH FROM CONCEPTION THROUGH THE POSTPARTUM PERIOD   _________________________________________Emergency and Crisis Resources If you are an imminent risk to self or others, are experiencing intense personal distress, and/or have noticed significant changes in activities of daily living, call:  911 [facility]: [phone]  [address], [place], [place], [address] Mobile Crisis: [phone] National Suicide Hotline: 29 Or visit the following crisis centers: Local Emergency Departments [facility]:  [address] [place]  Mon-Friday 8am-3pm, [phone]                                                                                  ___________ Non-Crisis Resources To identify specific providers that are covered by your insurance, contact your insurance company or local agencies:   Postpartum Support International- Warm-line: (803)486-2465                                                      __Outpatient Therapy and Medication Management   Providers:  [facility]: [phone] Hours: 9AM-5PM  Insurance Accepted: Pilar Jarvis, BCBS, Crista Luria, [place], Medicare  [facility]): [phone] Hours: 8AM-5:30PM  nsurance Accepted: All insurances EXCEPT AARP, [place], [place], and [employer] of the [place]: [phone] Hours: 8AM-8PM Insurance Accepted: Ulla Gallo, Crista Luria, [place], Medicare, [phone] [facility]: [phone] Hours: 8:30AM-7PM Insurance Accepted: Ulla Gallo, Medicaid, Medicare, Tricare, [employer] [facility]:  [phone]   Hours:9AM-5PM Insurance Accepted:  Monia Pouch, Ezequiel Essex, [employer], [place], [employer] Care  [facility]: [phone] Hours: 9AM-5:30PM Insurance Accepted: Pilar Jarvis, Teodoro Spray, and Medicaid, Medicare, [facility]:  [phone] Hours: 9am-5pm Insurance Accepted: BCBS; they do not accept Medicaid/Medicare [facility]: [phone] Hours: 9am-9pm Insurance Accepted: All major insurance including Medicaid and Medicare [facility]: [phone] Hours: 9AM- 5PM Insurance Accepted: All insurances EXCEPT Medicaid and Medicare. [facility]: [phone]   ____________                                                                     Parenting Support Groups Head of the [facility] at [facility] :  [address] [place], [place], [address] [phone] [facility]:  [phone] Family Support Network: (support for children in the NICU and/or with special needs), [phone]     _____________                                                                                  Online Resources Postpartum Support International: SeekAlumni.co.za  800-944-4PPD Supporting Moms:  www.momssupportingmoms.net

## 2023-02-27 ENCOUNTER — Other Ambulatory Visit (HOSPITAL_COMMUNITY)
Admission: RE | Admit: 2023-02-27 | Discharge: 2023-02-27 | Disposition: A | Discharge status: Home / Self Care | Payer: Medicaid Other | Source: Ambulatory Visit | Attending: Family Medicine | Admitting: Family Medicine

## 2023-02-27 ENCOUNTER — Ambulatory Visit (INDEPENDENT_AMBULATORY_CARE_PROVIDER_SITE_OTHER): Payer: Medicaid Other | Admitting: Family Medicine

## 2023-02-27 VITALS — BP 134/76 | HR 128 | Wt 270.6 lb

## 2023-02-27 DIAGNOSIS — N898 Other specified noninflammatory disorders of vagina: Secondary | ICD-10-CM

## 2023-02-27 DIAGNOSIS — O403XX0 Polyhydramnios, third trimester, not applicable or unspecified: Secondary | ICD-10-CM

## 2023-02-27 DIAGNOSIS — F322 Major depressive disorder, single episode, severe without psychotic features: Secondary | ICD-10-CM

## 2023-02-27 DIAGNOSIS — Z3480 Encounter for supervision of other normal pregnancy, unspecified trimester: Secondary | ICD-10-CM | POA: Diagnosis present

## 2023-02-27 DIAGNOSIS — O26893 Other specified pregnancy related conditions, third trimester: Secondary | ICD-10-CM

## 2023-02-27 DIAGNOSIS — O99210 Obesity complicating pregnancy, unspecified trimester: Secondary | ICD-10-CM

## 2023-02-27 DIAGNOSIS — O99213 Obesity complicating pregnancy, third trimester: Secondary | ICD-10-CM

## 2023-02-27 DIAGNOSIS — O24410 Gestational diabetes mellitus in pregnancy, diet controlled: Secondary | ICD-10-CM | POA: Diagnosis not present

## 2023-02-27 DIAGNOSIS — O163 Unspecified maternal hypertension, third trimester: Secondary | ICD-10-CM

## 2023-02-27 DIAGNOSIS — Z2911 Encounter for prophylactic immunotherapy for respiratory syncytial virus (RSV): Secondary | ICD-10-CM | POA: Diagnosis not present

## 2023-02-27 DIAGNOSIS — Z3A32 32 weeks gestation of pregnancy: Secondary | ICD-10-CM | POA: Diagnosis not present

## 2023-02-27 MED ORDER — SERTRALINE HCL 25 MG PO TABS
25.0000 mg | ORAL_TABLET | Freq: Every day | ORAL | 3 refills | Status: AC
Start: 2023-02-27 — End: ?

## 2023-02-27 NOTE — Progress Notes (Signed)
PRENATAL VISIT NOTE  Subjective:  Kim Crosby is a 23 y.o. [phone] at [redacted]w[redacted]d being seen today for ongoing prenatal care.  She is currently monitored for the following issues for this high-risk pregnancy and has Gestational diabetes mellitus in pregnancy, unspecified control; Polyhydramnios affecting pregnancy in third trimester; MDD (major depressive disorder), severe (HCC); Supervision of other normal pregnancy, antepartum; History of gestational diabetes; Obesity affecting pregnancy, antepartum; and Elevated blood pressure affecting pregnancy in third trimester, antepartum on their problem list.  Patient reports no bleeding, no contractions, no cramping, no leaking, and vaginal irritation.  Contractions: Not present. Vag. Bleeding: None.  Movement: Present. Denies leaking of fluid.   The following portions of the patient's history were reviewed and updated as appropriate: allergies, current medications, past family history, past medical history, past social history, past surgical history and problem list.   Objective:   Vitals:   02/27/23 1043  BP: 134/76  Pulse: (!) 128  Weight: 270 lb 9 oz (122.7 kg)    Fetal Status: Fetal Heart Rate (bpm): 152   Movement: Present     General:  Alert, oriented and cooperative. Patient is in no acute distress.  Skin: Skin is warm and dry. No rash noted.   Cardiovascular: Normal heart rate noted  Respiratory: Normal respiratory effort, no problems with respiration noted  Abdomen: Soft, gravid, appropriate for gestational age.  Pain/Pressure: Absent     Pelvic: Cervical exam deferred        Extremities: Normal range of motion.  Edema: None  Mental Status: Normal mood and affect. Normal behavior. Normal judgment and thought content.   Assessment and Plan:  Pregnancy: [id number] at [redacted]w[redacted]d 1. Supervision of other normal pregnancy, antepartum (Primary) FHR and BP appropriate today - Respiratory syncytial virus vaccine, preF, subunit,  bivalent,(Abrysvo) - Cervicovaginal ancillary only( [place])  2. Diet controlled gestational diabetes mellitus (GDM) in third trimester Patient started checking blood glucoses yesterday.  Has 3 glucoses recorded.  Fasting this morning was elevated at 109, lunch postprandial was 116.  The breakfast postprandial was elevated at 130.  Given the few checks patient will check 4 times a day and upload log to her chart to determine need for medication.  3. Polyhydramnios affecting pregnancy in third trimester Continue to follow with MFM  4. MDD (major depressive disorder), severe (HCC) Patient is seeing therapist.  Is interested in starting back on her Zoloft which she reports good response from previously.  Zoloft 25 mg sent to patient's pharmacy.  Will titrate as needed  5. Obesity affecting pregnancy, antepartum, unspecified obesity type  6. Elevated blood pressure affecting pregnancy in third trimester, antepartum Patient had 1 elevated blood pressure in the MAU with a normal recheck.  She then had an elevated blood pressure at MFM yesterday but recheck was also within normal limits.  Blood pressure within normal limits today.  Labs were previously collected with PC ratio within normal limits  7. [redacted] weeks gestation of pregnancy  8. Vaginal discharge during pregnancy in third trimester Patient reports increased vaginal discharge with some spotting after intercourse a few days ago.  Wet prep collected today. - Cervicovaginal ancillary only( [place])  Preterm labor symptoms and general obstetric precautions including but not limited to vaginal bleeding, contractions, leaking of fluid and fetal movement were reviewed in detail with the patient. Please refer to After Visit Summary for other counseling recommendations.   No follow-ups on file.  Future Appointments  Date Time Provider Department Center  03/05/2023  2:00 PM [facility] NURSE [facility]  03/05/2023  2:15 PM [facility] NST [facility]  [facility]  03/12/2023 10:30 AM [facility] NURSE [facility]  03/12/2023 10:45 AM [facility] NST [facility]  03/12/2023  1:55 PM Warden Fillers, MD [facility]  03/19/2023  2:15 PM [facility] NURSE [facility]  03/19/2023  2:30 PM [facility] US1 [facility]  03/26/2023  3:15 PM Mora Appl [facility]  04/02/2023  1:15 PM Adam Phenix, MD [facility]    Celedonio Savage, MD

## 2023-03-02 ENCOUNTER — Encounter: Payer: Self-pay | Admitting: Family Medicine

## 2023-03-02 LAB — CERVICOVAGINAL ANCILLARY ONLY
Bacterial Vaginitis (gardnerella): NEGATIVE
Candida Glabrata: NEGATIVE
Candida Vaginitis: POSITIVE — AB
Chlamydia: NEGATIVE
Comment: NEGATIVE
Comment: NEGATIVE
Comment: NEGATIVE
Comment: NEGATIVE
Comment: NEGATIVE
Comment: NORMAL
Neisseria Gonorrhea: NEGATIVE
Trichomonas: NEGATIVE

## 2023-03-02 MED ORDER — FLUCONAZOLE 150 MG PO TABS: 150.0000 mg | ORAL_TABLET | Freq: Once | ORAL | 0 refills | Status: AC

## 2023-03-02 NOTE — Addendum Note (Signed)
Addended by: Celedonio Savage on: 03/02/2023 02:24 PM   Modules accepted: Orders

## 2023-03-05 ENCOUNTER — Ambulatory Visit: Payer: Medicaid Other | Attending: Maternal & Fetal Medicine

## 2023-03-05 ENCOUNTER — Ambulatory Visit: Payer: Medicaid Other

## 2023-03-12 ENCOUNTER — Ambulatory Visit: Payer: Medicaid Other | Attending: Obstetrics and Gynecology

## 2023-03-12 ENCOUNTER — Ambulatory Visit: Payer: Medicaid Other

## 2023-03-12 ENCOUNTER — Encounter: Payer: Medicaid Other | Admitting: Obstetrics and Gynecology

## 2023-03-16 ENCOUNTER — Encounter: Payer: Medicaid Other | Admitting: Obstetrics and Gynecology

## 2023-03-19 ENCOUNTER — Ambulatory Visit: Payer: Medicaid Other | Attending: Maternal & Fetal Medicine

## 2023-03-19 ENCOUNTER — Ambulatory Visit: Payer: Medicaid Other

## 2023-03-23 ENCOUNTER — Ambulatory Visit: Payer: Self-pay | Admitting: Family Medicine

## 2023-03-23 NOTE — Telephone Encounter (Signed)
 Chief Complaint: left side pelvic/abdominal pain Symptoms: left side pain Frequency: 5 days Pertinent Negatives: Patient denies vaginal bleeding, vaginal discharge, vomiting, contractions Disposition: [x] ED /[] Urgent Care (no appt availability in office) / [] Appointment(In office/virtual)/ []  [place] [facility]/ [] Home Care/ [] Refused Recommended Disposition /[] [place] [facility]/ []  Follow-up with PCP Additional Notes: Patient called in with no established PCP, stating she called [place] because the [facility] takes up to 24 hours to return calls and looking for medical advice for left side pelvic pain x 5 days. Patient states it changes in nature, sometimes very severe to where she yells out in pain. Patient states she has noticed mild change in movements from baby but unable to fully describe, but states baby is still kicking. Patient is 36 weeks 2 days pregnant, planned delivery date of April 18, 2023. Patient denies vaginal discharge, vaginal bleeding, vomiting, contractions. Advised patient to go to L&D Triage now for evaluation. Patient verbalized understanding.    Copied from CRM [id number]. Topic: Clinical - Red Word Triage >> Mar 23, 2023 12:45 PM Payton Doughty wrote: Red Word that prompted transfer to Nurse Triage: severe pain in (left inside) pelvic area. [redacted] weeks pregnant. Reason for Disposition  [1] MILD abdominal pain (e.g., doesn't interfere with normal activities) or tightening AND [2] constant AND [3] present > 2 hours  Answer Assessment - Initial Assessment Questions 1. LOCATION: "Where does it hurt?"      Left side pelvic pain 2. RADIATION: "Does the pain shoot anywhere else?" (e.g., lower back, groin, thighs)     Same area.. where leg connects to abdomen in pelvis 3. ONSET: "When did the pain begin?" (e.g., minutes, hours or days ago)      4-5 days  5. PATTERN "Does the pain come and go, or is it constant?"    - If constant: "Is it getting better, staying the same, or  worsening?"      (Note: Constant means the pain never goes away completely; most serious pain is constant and gets worse over time)     - If intermittent: "How long does it last?" "Do you have pain now?"     (Note: Intermittent means the pain goes away completely between bouts)     Comes and goes depending on position and how she is walking 6. SEVERITY: "How bad is the pain?"  (e.g., Scale 1-10; mild, moderate, or severe)   - MILD (1-3): doesn't interfere with normal activities, area soft and not tender to touch    - MODERATE (4-7): interferes with normal activities or awakens from sleep, abdomen tender to touch    - SEVERE (8-10): excruciating pain, doubled over, unable to do any normal activities      Differs - sometimes its severe 8. CAUSE: "What do you think is causing the pelvic pain?"     Not sure 9. RELIEVING/AGGRAVATING FACTORS: "What makes it better or worse?" (e.g., activity/rest, sexual intercourse, voiding, passing stool)     Aggravating when she moves certain positions 10. OTHER SYMPTOMS: "Has there been any other symptoms?" (e.g., fever, constipation, diarrhea, urine problems, vaginal bleeding, vaginal discharge, or vomiting?"       No  11. PREGNANCY: "Is there any chance you are pregnant?" "When was your last menstrual period?"       Yes - 36 weeks  Answer Assessment - Initial Assessment Questions 1. LOCATION: "Where does it hurt?"      Left pelvic pain 2. RADIATION: "Does the pain shoot anywhere else?" (e.g., chest,  back)     no 3. ONSET: "When did the pain begin?" (Minutes, hours or days ago)      4-5 days 4. SUDDEN: "Gradual or sudden onset?"     Sudden onset 5. PATTERN: "Does the pain come and go, or has it been constant since it started?"      Comes and goes based off of position/walking 6. SEVERITY: "How bad is the pain?" "What does it keep you from doing?"  (e.g., Scale 1-10; mild, moderate, or severe)   - MILD (1-3): Doesn't interfere with normal activities,  abdomen soft and not tender to touch.    - MODERATE (4-7): Interferes with normal activities or awakens from sleep, abdomen tender to touch.    - SEVERE (8-10): Excruciating pain, doubled over, unable to do any normal activities.     Changes in nature, sometimes severe 8. CAUSE: "What do you think is causing the stomach pain?     Not sure 9. RELIEVING/AGGRAVATING FACTORS: "What makes it better or worse?" (e.g., antacids, bowel movement, movement)     If patient lies down and is completely still 10. FETAL MOVEMENT: "Has the baby's movement decreased or changed significantly from normal?"      "Baby is still moving, but more subtle kicks" 11. OTHER SYMPTOMS: "Do you have any other symptoms?" (e.g., back pain, contractions, diarrhea, fever, headache, urination pain, vaginal bleeding, vaginal discharge, vomiting)       Back pain 12. EDD: "What date are you expecting to deliver?"       April 18, 2023  Protocols used: Pelvic Pain - Female-A-AH, Pregnancy - Abdominal Pain Greater Than [redacted] Weeks EGA-A-AH

## 2023-03-24 ENCOUNTER — Encounter: Payer: Medicaid Other | Admitting: Obstetrics and Gynecology

## 2023-03-26 ENCOUNTER — Inpatient Hospital Stay (HOSPITAL_COMMUNITY)
Admission: AD | Admit: 2023-03-26 | Discharge: 2023-03-30 | DRG: 806 | Disposition: A | Discharge status: Home / Self Care | Attending: Obstetrics and Gynecology | Admitting: Obstetrics and Gynecology

## 2023-03-26 ENCOUNTER — Ambulatory Visit (INDEPENDENT_AMBULATORY_CARE_PROVIDER_SITE_OTHER): Payer: Medicaid Other | Admitting: Advanced Practice Midwife

## 2023-03-26 ENCOUNTER — Other Ambulatory Visit (HOSPITAL_COMMUNITY)
Admission: RE | Admit: 2023-03-26 | Discharge: 2023-03-26 | Disposition: A | Discharge status: Home / Self Care | Source: Ambulatory Visit | Attending: Advanced Practice Midwife | Admitting: Advanced Practice Midwife

## 2023-03-26 ENCOUNTER — Encounter (HOSPITAL_COMMUNITY): Payer: Self-pay | Admitting: Obstetrics and Gynecology

## 2023-03-26 ENCOUNTER — Ambulatory Visit

## 2023-03-26 ENCOUNTER — Inpatient Hospital Stay (HOSPITAL_COMMUNITY)

## 2023-03-26 VITALS — BP 128/90 | HR 85 | Wt 272.0 lb

## 2023-03-26 DIAGNOSIS — O288 Other abnormal findings on antenatal screening of mother: Secondary | ICD-10-CM | POA: Diagnosis not present

## 2023-03-26 DIAGNOSIS — O133 Gestational [pregnancy-induced] hypertension without significant proteinuria, third trimester: Secondary | ICD-10-CM | POA: Diagnosis not present

## 2023-03-26 DIAGNOSIS — D62 Acute posthemorrhagic anemia: Secondary | ICD-10-CM | POA: Diagnosis not present

## 2023-03-26 DIAGNOSIS — Z8249 Family history of ischemic heart disease and other diseases of the circulatory system: Secondary | ICD-10-CM | POA: Diagnosis not present

## 2023-03-26 DIAGNOSIS — Z833 Family history of diabetes mellitus: Secondary | ICD-10-CM

## 2023-03-26 DIAGNOSIS — O1493 Unspecified pre-eclampsia, third trimester: Secondary | ICD-10-CM

## 2023-03-26 DIAGNOSIS — Z3A36 36 weeks gestation of pregnancy: Secondary | ICD-10-CM

## 2023-03-26 DIAGNOSIS — O99213 Obesity complicating pregnancy, third trimester: Secondary | ICD-10-CM

## 2023-03-26 DIAGNOSIS — O9081 Anemia of the puerperium: Secondary | ICD-10-CM | POA: Diagnosis not present

## 2023-03-26 DIAGNOSIS — F322 Major depressive disorder, single episode, severe without psychotic features: Secondary | ICD-10-CM | POA: Diagnosis present

## 2023-03-26 DIAGNOSIS — E66813 Obesity, class 3: Secondary | ICD-10-CM | POA: Diagnosis present

## 2023-03-26 DIAGNOSIS — O24429 Gestational diabetes mellitus in childbirth, unspecified control: Secondary | ICD-10-CM | POA: Diagnosis present

## 2023-03-26 DIAGNOSIS — O283 Abnormal ultrasonic finding on antenatal screening of mother: Secondary | ICD-10-CM

## 2023-03-26 DIAGNOSIS — O24419 Gestational diabetes mellitus in pregnancy, unspecified control: Secondary | ICD-10-CM

## 2023-03-26 DIAGNOSIS — Z3480 Encounter for supervision of other normal pregnancy, unspecified trimester: Secondary | ICD-10-CM | POA: Diagnosis present

## 2023-03-26 DIAGNOSIS — Z8759 Personal history of other complications of pregnancy, childbirth and the puerperium: Secondary | ICD-10-CM | POA: Diagnosis present

## 2023-03-26 DIAGNOSIS — O403XX0 Polyhydramnios, third trimester, not applicable or unspecified: Secondary | ICD-10-CM

## 2023-03-26 DIAGNOSIS — O99824 Streptococcus B carrier state complicating childbirth: Secondary | ICD-10-CM | POA: Diagnosis present

## 2023-03-26 DIAGNOSIS — O99214 Obesity complicating childbirth: Secondary | ICD-10-CM | POA: Diagnosis present

## 2023-03-26 DIAGNOSIS — O368330 Maternal care for abnormalities of the fetal heart rate or rhythm, third trimester, not applicable or unspecified: Secondary | ICD-10-CM | POA: Diagnosis not present

## 2023-03-26 DIAGNOSIS — E669 Obesity, unspecified: Secondary | ICD-10-CM

## 2023-03-26 DIAGNOSIS — O1404 Mild to moderate pre-eclampsia, complicating childbirth: Secondary | ICD-10-CM | POA: Diagnosis not present

## 2023-03-26 DIAGNOSIS — Z79899 Other long term (current) drug therapy: Secondary | ICD-10-CM | POA: Diagnosis not present

## 2023-03-26 DIAGNOSIS — O24415 Gestational diabetes mellitus in pregnancy, controlled by oral hypoglycemic drugs: Secondary | ICD-10-CM

## 2023-03-26 DIAGNOSIS — O09293 Supervision of pregnancy with other poor reproductive or obstetric history, third trimester: Secondary | ICD-10-CM

## 2023-03-26 DIAGNOSIS — O99344 Other mental disorders complicating childbirth: Secondary | ICD-10-CM | POA: Diagnosis present

## 2023-03-26 DIAGNOSIS — O289 Unspecified abnormal findings on antenatal screening of mother: Secondary | ICD-10-CM

## 2023-03-26 DIAGNOSIS — O24424 Gestational diabetes mellitus in childbirth, insulin controlled: Secondary | ICD-10-CM | POA: Diagnosis not present

## 2023-03-26 DIAGNOSIS — Z3490 Encounter for supervision of normal pregnancy, unspecified, unspecified trimester: Secondary | ICD-10-CM | POA: Diagnosis present

## 2023-03-26 DIAGNOSIS — O1490 Unspecified pre-eclampsia, unspecified trimester: Secondary | ICD-10-CM | POA: Diagnosis present

## 2023-03-26 LAB — GLUCOSE, CAPILLARY
Glucose-Capillary: 92 mg/dL (ref 70–99)
Glucose-Capillary: 78 mg/dL (ref 70–99)

## 2023-03-26 LAB — CBC
HCT: 35.1 % — ABNORMAL LOW (ref 36.0–46.0)
Hemoglobin: 11.5 g/dL — ABNORMAL LOW (ref 12.0–15.0)
MCH: 26.8 pg (ref 26.0–34.0)
MCHC: 32.8 g/dL (ref 30.0–36.0)
MCV: 81.8 fL (ref 80.0–100.0)
Platelets: 278 10*3/uL (ref 150–400)
RBC: 4.29 MIL/uL (ref 3.87–5.11)
RDW: 15.9 % — ABNORMAL HIGH (ref 11.5–15.5)
WBC: 8.6 10*3/uL (ref 4.0–10.5)
nRBC: 0.2 % (ref 0.0–0.2)

## 2023-03-26 LAB — COMPREHENSIVE METABOLIC PANEL
ALT: 13 U/L (ref 0–44)
AST: 17 U/L (ref 15–41)
Albumin: 2.5 g/dL — ABNORMAL LOW (ref 3.5–5.0)
Alkaline Phosphatase: 77 U/L (ref 38–126)
Anion gap: 13 (ref 5–15)
BUN: 13 mg/dL (ref 6–20)
CO2: 18 mmol/L — ABNORMAL LOW (ref 22–32)
Calcium: 9.3 mg/dL (ref 8.9–10.3)
Chloride: 104 mmol/L (ref 98–111)
Creatinine, Ser: 0.48 mg/dL (ref 0.44–1.00)
GFR, Estimated: >60 mL/min (ref >60)
Glucose, Bld: 106 mg/dL — ABNORMAL HIGH (ref 70–99)
Potassium: 3.5 mmol/L (ref 3.5–5.1)
Sodium: 135 mmol/L (ref 135–145)
Total Bilirubin: 0.5 mg/dL (ref 0.0–1.2)
Total Protein: 6.1 g/dL — ABNORMAL LOW (ref 6.5–8.1)

## 2023-03-26 LAB — PROTEIN / CREATININE RATIO, URINE
Creatinine, Urine: 141 mg/dL
Protein Creatinine Ratio: 1.48 mg/mg{creat} — ABNORMAL HIGH (ref 0.00–0.15)
Total Protein, Urine: 209 mg/dL

## 2023-03-26 MED ORDER — NIFEDIPINE 10 MG PO CAPS: 20.0000 mg | ORAL_CAPSULE | ORAL | Status: DC | PRN

## 2023-03-26 MED ORDER — ACCU-CHEK GUIDE W/DEVICE KIT: 1.0000 | PACK | 0 refills | Status: DC | PRN

## 2023-03-26 MED ORDER — METFORMIN HCL 500 MG PO TABS: 500.0000 mg | ORAL_TABLET | Freq: Every day | ORAL | 1 refills | Status: DC

## 2023-03-26 MED ORDER — NIFEDIPINE 10 MG PO CAPS: 10.0000 mg | ORAL_CAPSULE | ORAL | Status: DC | PRN

## 2023-03-26 MED ORDER — LABETALOL HCL 5 MG/ML IV SOLN: 40.0000 mg | INTRAVENOUS | Status: DC | PRN

## 2023-03-26 NOTE — Patient Instructions (Signed)
 Things to Try After 37 weeks to Encourage Labor/Get Ready for Labor:    Try the [employer] at [email or web address].[email or web address] daily to improve baby's position and encourage the onset of labor.  Walk a little and rest a little every day.  Change positions often.  Cervical Ripening: May try one or both Red Raspberry Leaf capsules or tea:  two 300mg  or 400mg  tablets with each meal, 2-3 times a day, or 1-3 cups of tea daily  Potential Side Effects Of Raspberry Leaf:  Most women do not experience any side effects from drinking raspberry leaf tea. However, nausea and loose stools are possible   Evening Primrose Oil capsules: take 1 capsule by mouth and place one capsule in the vagina every night.    Some of the potential side effects:  Upset stomach  Loose stools or diarrhea  Headaches  Nausea  Sex can also help the cervix ripen and encourage labor onset.

## 2023-03-26 NOTE — MAU Note (Signed)
 Kim Crosby is a 23 y.o. at [redacted]w[redacted]d here in MAU reporting: pt sent over for further monitoring.  GDM.  BPP 6/10,  BP elevated- dx today with GHTN.  Pt denies pain, no bleeding or LOF.  States baby is moving same as usual. Denies HA, visual changes, epigastric pain,has had swelling in feet.  Not changing.   Onset of complaint: today Pain score: none Vitals:   03/26/23 1822  BP: (!) 159/93  Pulse: 100  Resp: 17  Temp: 98.5 F (36.9 C)  SpO2: 99%     OZH:YQMVH on- 'moving right now' Lab orders placed from triage:

## 2023-03-26 NOTE — MAU Provider Note (Signed)
 Chief Complaint:  Hypertension   HPI   Kim Crosby is a 23 y.o. [phone] at [redacted]w[redacted]d who presents to maternity unit from [facility] office for BPP 6/10 ( -2 for fetal breathing, -2 for Fetal movements, -2 for non reactive NST) Patient is a A1-GDM who was unable to check her CBG's due to a broken meter therefore decision today was to start her on Metformin. She is also followed for gHTN with elevated BP's today. Denies HA's, visual changes, RUQ pain, N/V/D and reports that after her testing today she was told to eat prior to arrival. Patient reports she consumed fruit roll ups, granola bars, juice, pretzels and crackers at approximately  1800.   She reports no VB, LOF, CTX and feels fetal movements gHTN - MRBP's  upon review of flow sheets from 02/09/23., 2/7, 2/13, and today meeting criteria    Pregnancy Course: [facility]  Past Medical History:  Diagnosis Date   Anemia    Anxiety    Bipolar 2 disorder (HCC)    Depression    Gestational diabetes    Retinitis    OB History  Gravida Para Term Preterm AB Living  5 1 1  3 1   SAB IAB Ectopic Multiple Live Births  1 2  0 1    # Outcome Date GA Lbr Len/2nd Weight Sex Type Anes PTL Lv  5 Current           4 IAB 11/08/21          3 SAB 04/13/21          2 Term 10/31/19 [redacted]w[redacted]d 23:28 / 01:32 3824 g M Vag-Spont EPI  LIV  1 IAB            Past Surgical History:  Procedure Laterality Date   INDUCED ABORTION     REFRACTIVE SURGERY Bilateral    Family History  Problem Relation Age of Onset   Diabetes Mother    Gestational diabetes Mother        At younger age   Asthma Father    Diabetes Maternal Grandmother    Kidney disease Maternal Grandmother    Gestational diabetes Maternal Grandmother        When younger   Hypertension Paternal Grandfather    Cancer Neg Hx    Social History   Tobacco Use   Smoking status: Never   Smokeless tobacco: Never  Vaping Use   Vaping status: Never Used  Substance Use Topics   Alcohol use: No   Drug use:  Not Currently    Types: Marijuana    Comment: last use aug 2024   Allergies  Allergen Reactions   Pork-Derived Products Other (See Comments)    Life style preference Other reaction(s): Other (See Comments) Life style preference   Medications Prior to Admission  Medication Sig Dispense Refill Last Dose/Taking   acetaminophen (TYLENOL) 325 MG tablet Take 2 tablets (650 mg total) by mouth every 4 (four) hours as needed (for pain scale < 4).   Past Week   Prenatal 27-1 MG TABS Take 1 tablet by mouth daily. 30 tablet 11 Past Week   sertraline (ZOLOFT) 25 MG tablet Take 1 tablet (25 mg total) by mouth daily. 90 tablet 3 03/25/2023   Accu-Chek Softclix Lancets lancets Use as instructed (Patient not taking: Reported on 03/26/2023) 100 each 12    Blood Glucose Monitoring Suppl (ACCU-CHEK GUIDE) w/Device KIT 1 Device by Does not apply route as needed. 1 kit 0  Blood Pressure Monitoring (BLOOD PRESSURE KIT) DEVI 1 Device by Does not apply route 3 times/day as needed-between meals & bedtime. (Patient not taking: Reported on 03/26/2023) 1 each 0    cyclobenzaprine (FLEXERIL) 10 MG tablet Take 1 tablet (10 mg total) by mouth 3 (three) times daily as needed for muscle spasms. (Patient not taking: Reported on 03/26/2023) 30 tablet 0    glucose blood (ACCU-CHEK GUIDE TEST) test strip Use as instructed (Patient not taking: Reported on 03/26/2023) 100 each 12    metFORMIN (GLUCOPHAGE) 500 MG tablet Take 1 tablet (500 mg total) by mouth at bedtime. 30 tablet 1    pantoprazole (PROTONIX) 20 MG tablet Take 2 tablets (40 mg total) by mouth daily. (Patient not taking: Reported on 02/27/2023) 60 tablet 3     I have reviewed patient's Past Medical Hx, Surgical Hx, Family Hx, Social Hx, medications and allergies.   ROS  Pertinent items noted in HPI and remainder of comprehensive ROS otherwise negative.   PHYSICAL EXAM  Patient Vitals for the past 24 hrs:  BP Temp Temp src Pulse Resp SpO2 Height Weight  03/26/23  2001 129/66 -- -- (!) 103 -- -- -- --  03/26/23 1915 (!) 139/92 -- -- 99 -- 99 % -- --  03/26/23 1900 (!) 144/89 -- -- (!) 111 -- 99 % -- --  03/26/23 1849 (!) 150/89 -- -- (!) 104 15 100 % -- --  03/26/23 1822 (!) 159/93 98.5 F (36.9 C) Oral 100 17 99 % 5\' 3"  (1.6 m) 124.3 kg    Constitutional: Well-developed, obese female in no acute distress.  Cardiovascular: normal rate & rhythm, warm and well-perfused Respiratory: normal effort, no problems with respiration noted, lungs BCTA GI: Abd soft, non-tender, gravid, no RUQ pain (limited exam d/t increased body habitus) MS: Extremities nontender, no edema, normal ROM Neurologic: Alert and oriented x 4.  GU: no CVA tenderness Pelvic: Deferred     Fetal Tracing: Cat 2 @ 1944  Baseline: 145-150 Variability: moderate  Accelerations: present Decelerations: variables noted Toco: irregular ctx, unappreciated by patient  BPP 8/8 via verbal report @ 2043   Labs: Results for orders placed or performed during the hospital encounter of 03/26/23 (from the past 24 hours)  CBC     Status: Abnormal   Collection Time: 03/26/23  7:25 PM  Result Value Ref Range   WBC 8.6 4.0 - 10.5 K/uL   RBC 4.29 3.87 - 5.11 MIL/uL   Hemoglobin 11.5 (L) 12.0 - 15.0 g/dL   HCT 16.1 (L) 09.6 - 04.5 %   MCV 81.8 80.0 - 100.0 fL   MCH 26.8 26.0 - 34.0 pg   MCHC 32.8 30.0 - 36.0 g/dL   RDW 40.9 (H) 81.1 - 91.4 %   Platelets 278 150 - 400 K/uL   nRBC 0.2 0.0 - 0.2 %  Comprehensive metabolic panel     Status: Abnormal   Collection Time: 03/26/23  7:25 PM  Result Value Ref Range   Sodium 135 135 - 145 mmol/L   Potassium 3.5 3.5 - 5.1 mmol/L   Chloride 104 98 - 111 mmol/L   CO2 18 (L) 22 - 32 mmol/L   Glucose, Bld 106 (H) 70 - 99 mg/dL   BUN 13 6 - 20 mg/dL   Creatinine, Ser 7.82 0.44 - 1.00 mg/dL   Calcium 9.3 8.9 - 95.6 mg/dL   Total Protein 6.1 (L) 6.5 - 8.1 g/dL   Albumin 2.5 (L) 3.5 - 5.0 g/dL  AST 17 15 - 41 U/L   ALT 13 0 - 44 U/L   Alkaline  Phosphatase 77 38 - 126 U/L   Total Bilirubin 0.5 0.0 - 1.2 mg/dL   GFR, Estimated >17 >61 mL/min   Anion gap 13 5 - 15  Glucose, capillary     Status: None   Collection Time: 03/26/23  8:08 PM  Result Value Ref Range   Glucose-Capillary 78 70 - 99 mg/dL    Imaging:  No results found.  MDM & MAU COURSE  MDM:  HIGH  - Labs  - BP monitoring - BPP  - NST - Consider OBS admission for prolonged observation - D/W Dr Berton Lan @ [phone]   I have reviewed the patient chart and performed the physical exam . I have ordered & interpreted the lab results and reviewed and interpreted the NST Medications ordered as stated below.  A/P as described below.  Counseling and education provided and patient agreeable  with plan as described below. Verbalized understanding.    MAU Course: Orders Placed This Encounter  Procedures   [place] MFM FETAL BPP WO NON STRESS   CBC   Comprehensive metabolic panel   Protein / creatinine ratio, urine   Glucose, capillary   Notify physician (specify) Confirmatory reading of BP> 160/110 15 minutes later   Apply Hypertensive Disorders of Pregnancy Care Plan   Vital signs   Measure blood pressure   Meds ordered this encounter  Medications   AND Linked Order Group    NIFEdipine (PROCARDIA) capsule 10 mg    NIFEdipine (PROCARDIA) capsule 20 mg    NIFEdipine (PROCARDIA) capsule 20 mg    labetalol (NORMODYNE) injection 40 mg    ASSESSMENT   1. Supervision of other normal pregnancy, antepartum     PLAN   Planning for admission vs IOL   Patient signed out to Dr Berton Lan at 2100 who will discuss further management plan with patient    Marcell Barlow, MSN, [facility], Center for [facility]

## 2023-03-26 NOTE — H&P (Signed)
 OBSTETRIC ADMISSION HISTORY AND PHYSICAL  Kim Crosby is a 23 y.o. female [phone] with IUP at [redacted]w[redacted]d by 9wk [place] being admitted for IOL for abnormal antenatal testing, preeclampsia without SF, and uncontrolled GDM.  Seen in office today and NST was nonreactive with BPP 6/8. Mild range BP ruled pt in for gHTN in office. She was instructed to come to [facility]U for further evaluation. Here her testing has been more reassuring with a reactive NST with rare variables and BPP 8/8. She has had mild range BP and now has PCR 1.48 c/w preeclampsia.   Denies ctx, VB, LOF. +FM Denies HA, visual changes, CP/SOB, RUQ/epigastric pain  She plans on breast & formula feeding. She is undecided birth control. She received her prenatal care at [facility]   Dating: By 9wk [place] --->  Estimated Date of Delivery: 04/18/23  Sono:   @31 /6 1916g (49%), AC 56%, 20.93, cephalic, anterior @36 /5 cephalic, AFI 23.3, BPP 8/8, anterior  Prenatal History/Complications:  - GDM - meter broke so has not been checking & no current meds. AGA on growth at 31 weeks. Previously reported fasting levels in the 100s - Depression - on zoloft; notes significant symptoms recently that have affected her ability to engage with her prenatal care. Plan for SW eval for support PP  Past Medical History: Past Medical History:  Diagnosis Date   Anemia    Anxiety    Bipolar 2 disorder (HCC)    Depression    Gestational diabetes    Retinitis    Past Surgical History: Past Surgical History:  Procedure Laterality Date   INDUCED ABORTION     REFRACTIVE SURGERY Bilateral    Obstetrical History: OB History     Gravida  5   Para  1   Term  1   Preterm      AB  3   Living  1      SAB  1   IAB  2   Ectopic      Multiple  0   Live Births  1           Social History Social History   Socioeconomic History   Marital status: Single    Spouse name: Not on file   Number of children: Not on file   Years of education: current JR  year college   Highest education level: Some college, no degree  Occupational History   Not on file  Tobacco Use   Smoking status: Never   Smokeless tobacco: Never  Vaping Use   Vaping status: Never Used  Substance and Sexual Activity   Alcohol use: No   Drug use: Not Currently    Types: Marijuana    Comment: last use aug 2024   Sexual activity: Yes  Other Topics Concern   Not on file  Social History Narrative   Not on file   Social Drivers of Health   Financial Resource Strain: Not on file  Food Insecurity: No Food Insecurity (03/26/2023)   Hunger Vital Sign    Worried About Running Out of Food in the Last Year: Never true    Ran Out of Food in the Last Year: Never true  Transportation Needs: Unmet Transportation Needs (03/26/2023)   PRAPARE - Administrator, Civil Service (Medical): Yes    Lack of Transportation (Non-Medical): No  Physical Activity: Not on file  Stress: Not on file  Social Connections: Not on file    Family History: Family History  Problem Relation Age of Onset   Diabetes Mother    Gestational diabetes Mother        At younger age   Asthma Father    Diabetes Maternal Grandmother    Kidney disease Maternal Grandmother    Gestational diabetes Maternal Grandmother        When younger   Hypertension Paternal Grandfather    Cancer Neg Hx     Allergies: Allergies  Allergen Reactions   Pork-Derived Products Other (See Comments)    Life style preference Other reaction(s): Other (See Comments) Life style preference    Medications Prior to Admission  Medication Sig Dispense Refill Last Dose/Taking   acetaminophen (TYLENOL) 325 MG tablet Take 2 tablets (650 mg total) by mouth every 4 (four) hours as needed (for pain scale < 4).   Past Week   Prenatal 27-1 MG TABS Take 1 tablet by mouth daily. 30 tablet 11 Past Week   sertraline (ZOLOFT) 25 MG tablet Take 1 tablet (25 mg total) by mouth daily. 90 tablet 3 03/25/2023   Accu-Chek Softclix  Lancets lancets Use as instructed (Patient not taking: Reported on 03/26/2023) 100 each 12    Blood Glucose Monitoring Suppl (ACCU-CHEK GUIDE) w/Device KIT 1 Device by Does not apply route as needed. 1 kit 0    Blood Pressure Monitoring (BLOOD PRESSURE KIT) DEVI 1 Device by Does not apply route 3 times/day as needed-between meals & bedtime. (Patient not taking: Reported on 03/26/2023) 1 each 0    cyclobenzaprine (FLEXERIL) 10 MG tablet Take 1 tablet (10 mg total) by mouth 3 (three) times daily as needed for muscle spasms. (Patient not taking: Reported on 03/26/2023) 30 tablet 0    glucose blood (ACCU-CHEK GUIDE TEST) test strip Use as instructed (Patient not taking: Reported on 03/26/2023) 100 each 12    metFORMIN (GLUCOPHAGE) 500 MG tablet Take 1 tablet (500 mg total) by mouth at bedtime. 30 tablet 1    pantoprazole (PROTONIX) 20 MG tablet Take 2 tablets (40 mg total) by mouth daily. (Patient not taking: Reported on 02/27/2023) 60 tablet 3      Review of Systems   All systems reviewed and negative except as stated in HPI  Blood pressure 129/66, pulse (!) 103, temperature 98.5 F (36.9 C), temperature source Oral, resp. rate 15, height 5\' 3"  (1.6 m), weight 124.3 kg, SpO2 99%, unknown if currently breastfeeding. General appearance: alert, cooperative, and no distress, intermittently tearful during initial discussion about IOL Lungs: normal effort Heart: mild tachycardia Pelvic: defer until on L&D Presentation: cephalic Fetal monitoring: 150bpm, moderate variability, +accels, occasional variables Uterine activity None   Prenatal labs: ABO, Rh: B/Positive/-- (09/26 1541) Antibody: Negative (09/26 1541) Rubella: 6.16 (09/26 1541) RPR: Non Reactive (01/30 0830)  HBsAg: Negative (09/26 1541)  HIV: Non Reactive (01/30 0830)  GBS:  Unknown Known GDM Genetic screening  LR NIPS, neg AFP Anatomy US Polyhydramnios but otherwise normal  Prenatal Transfer Tool  Maternal Diabetes: Yes - GDM, no  medications but not controlled Genetic Screening: Normal Maternal Ultrasounds/Referrals: Other: Polyhydramnios Fetal Ultrasounds or other Referrals:  None Maternal Substance Abuse:  No Significant Maternal Medications:  Meds include: Zoloft Significant Maternal Lab Results:  None Number of Prenatal Visits:greater than 3 verified prenatal visits Other Comments:  None  Results for orders placed or performed during the hospital encounter of 03/26/23 (from the past 24 hours)  Protein / creatinine ratio, urine   Collection Time: 03/26/23  6:43 PM  Result Value Ref Range  Creatinine, Urine 141 mg/dL   Total Protein, Urine 209 mg/dL   Protein Creatinine Ratio 1.48 (H) 0.00 - 0.15 mg/mg[Cre]  CBC   Collection Time: 03/26/23  7:25 PM  Result Value Ref Range   WBC 8.6 4.0 - 10.5 K/uL   RBC 4.29 3.87 - 5.11 MIL/uL   Hemoglobin 11.5 (L) 12.0 - 15.0 g/dL   HCT 16.1 (L) 09.6 - 04.5 %   MCV 81.8 80.0 - 100.0 fL   MCH 26.8 26.0 - 34.0 pg   MCHC 32.8 30.0 - 36.0 g/dL   RDW 40.9 (H) 81.1 - 91.4 %   Platelets 278 150 - 400 K/uL   nRBC 0.2 0.0 - 0.2 %  Comprehensive metabolic panel   Collection Time: 03/26/23  7:25 PM  Result Value Ref Range   Sodium 135 135 - 145 mmol/L   Potassium 3.5 3.5 - 5.1 mmol/L   Chloride 104 98 - 111 mmol/L   CO2 18 (L) 22 - 32 mmol/L   Glucose, Bld 106 (H) 70 - 99 mg/dL   BUN 13 6 - 20 mg/dL   Creatinine, Ser 7.82 0.44 - 1.00 mg/dL   Calcium 9.3 8.9 - 95.6 mg/dL   Total Protein 6.1 (L) 6.5 - 8.1 g/dL   Albumin 2.5 (L) 3.5 - 5.0 g/dL   AST 17 15 - 41 U/L   ALT 13 0 - 44 U/L   Alkaline Phosphatase 77 38 - 126 U/L   Total Bilirubin 0.5 0.0 - 1.2 mg/dL   GFR, Estimated >21 >30 mL/min   Anion gap 13 5 - 15  Glucose, capillary   Collection Time: 03/26/23  8:08 PM  Result Value Ref Range   Glucose-Capillary 78 70 - 99 mg/dL  Results for orders placed or performed in visit on 03/26/23 (from the past 24 hours)  Glucose, capillary   Collection Time: 03/26/23   3:07 PM  Result Value Ref Range   Glucose-Capillary 92 70 - 99 mg/dL    Patient Active Problem List   Diagnosis Date Noted   Gestational hypertension, third trimester 03/26/2023   Elevated blood pressure affecting pregnancy in third trimester, antepartum 02/05/2023   Obesity affecting pregnancy, antepartum 11/10/2022   History of gestational diabetes 10/09/2022   Supervision of other normal pregnancy, antepartum 10/02/2022   MDD (major depressive disorder), severe (HCC) 02/23/2022   Polyhydramnios affecting pregnancy in third trimester 10/19/2019   Gestational diabetes mellitus in pregnancy, controlled by oral hypoglycemic drugs 08/13/2019    Assessment/Plan:  Kim Crosby is a 23 y.o. [id number] at [redacted]w[redacted]d here for IOL for abnormal antenatal testing, preeclampsia, and GDM   #Labor: SCE closed in office today, will start with dual cytotec when bed is available. Discussed FB, AROM, pit pending clinical course #Pain: Considering epidural, unsure #FWB: cEFM #ID:  GBS PCR pending, ppx ordered for GA < 37w #MOF: breast & formula #MOC:undecided #Circ:  Desired  #Preeclampsia Normotensive to mild range Serum labs WNL, will only trend if clinical status changes Antihypertensives prn No current SF, mag not indicated  #GDM CBG q4h w/ SSI prn  Lennart Pall, MD  03/26/2023, 10:04 PM

## 2023-03-27 ENCOUNTER — Encounter (HOSPITAL_COMMUNITY): Payer: Self-pay | Admitting: Obstetrics and Gynecology

## 2023-03-27 ENCOUNTER — Other Ambulatory Visit: Payer: Self-pay

## 2023-03-27 LAB — COMPREHENSIVE METABOLIC PANEL
ALT: 12 IU/L (ref 0–32)
AST: 13 IU/L (ref 0–40)
Albumin: 3.5 g/dL — ABNORMAL LOW (ref 4.0–5.0)
Alkaline Phosphatase: 103 IU/L (ref 44–121)
BUN/Creatinine Ratio: 25 — ABNORMAL HIGH (ref 9–23)
BUN: 13 mg/dL (ref 6–20)
Bilirubin Total: <0.2 mg/dL (ref 0.0–1.2)
CO2: 17 mmol/L — ABNORMAL LOW (ref 20–29)
Calcium: 9.9 mg/dL (ref 8.7–10.2)
Chloride: 104 mmol/L (ref 96–106)
Creatinine, Ser: 0.52 mg/dL — ABNORMAL LOW (ref 0.57–1.00)
Globulin, Total: 2.7 g/dL (ref 1.5–4.5)
Glucose: 82 mg/dL (ref 70–99)
Potassium: 4.4 mmol/L (ref 3.5–5.2)
Sodium: 136 mmol/L (ref 134–144)
Total Protein: 6.2 g/dL (ref 6.0–8.5)
eGFR: 133 mL/min/{1.73_m2} (ref >59)

## 2023-03-27 LAB — GROUP B STREP BY PCR: Group B strep by PCR: POSITIVE — AB

## 2023-03-27 LAB — GLUCOSE, CAPILLARY
Glucose-Capillary: 86 mg/dL (ref 70–99)
Glucose-Capillary: 100 mg/dL — ABNORMAL HIGH (ref 70–99)
Glucose-Capillary: 136 mg/dL — ABNORMAL HIGH (ref 70–99)
Glucose-Capillary: 121 mg/dL — ABNORMAL HIGH (ref 70–99)
Glucose-Capillary: 78 mg/dL (ref 70–99)
Glucose-Capillary: 169 mg/dL — ABNORMAL HIGH (ref 70–99)
Glucose-Capillary: 70 mg/dL (ref 70–99)

## 2023-03-27 LAB — CERVICOVAGINAL ANCILLARY ONLY
Chlamydia: NEGATIVE
Comment: NEGATIVE
Comment: NORMAL
Neisseria Gonorrhea: NEGATIVE

## 2023-03-27 LAB — TYPE AND SCREEN
ABO/RH(D): B POS
Antibody Screen: NEGATIVE

## 2023-03-27 LAB — CBC
Hematocrit: 37.6 % (ref 34.0–46.6)
Hemoglobin: 12.4 g/dL (ref 11.1–15.9)
MCH: 27.3 pg (ref 26.6–33.0)
MCHC: 33 g/dL (ref 31.5–35.7)
MCV: 83 fL (ref 79–97)
Platelets: 329 10*3/uL (ref 150–450)
RBC: 4.55 x10E6/uL (ref 3.77–5.28)
RDW: 15.8 % — ABNORMAL HIGH (ref 11.7–15.4)
WBC: 7.7 10*3/uL (ref 3.4–10.8)

## 2023-03-27 LAB — PROTEIN / CREATININE RATIO, URINE
Creatinine, Urine: 211.2 mg/dL
Protein, Ur: 274.2 mg/dL
Protein/Creat Ratio: 1298 mg/g{creat} — ABNORMAL HIGH (ref 0–200)

## 2023-03-27 LAB — WET PREP, GENITAL
Sperm: NONE SEEN
Trich, Wet Prep: NONE SEEN
WBC, Wet Prep HPF POC: >=10 — AB (ref <10)

## 2023-03-27 LAB — RPR: RPR Ser Ql: NONREACTIVE

## 2023-03-27 MED ORDER — OXYTOCIN-SODIUM CHLORIDE 30-0.9 UT/500ML-% IV SOLN
2.5000 [IU]/h | INTRAVENOUS | Status: DC
  Filled 2023-03-27: qty 500

## 2023-03-27 MED ORDER — SODIUM CHLORIDE 0.9% FLUSH: 3.0000 mL | INTRAVENOUS | Status: DC | PRN

## 2023-03-27 MED ORDER — SOD CITRATE-CITRIC ACID 500-334 MG/5ML PO SOLN
30.0000 mL | ORAL | Status: DC | PRN
  Filled 2023-03-27: qty 30

## 2023-03-27 MED ORDER — SODIUM CHLORIDE 0.9% FLUSH: 3.0000 mL | Freq: Two times a day (BID) | INTRAVENOUS | Status: DC

## 2023-03-27 MED ORDER — OXYTOCIN BOLUS FROM INFUSION
333.0000 mL | Freq: Once | INTRAVENOUS | Status: AC
  Administered 2023-03-28: 333 mL via INTRAVENOUS

## 2023-03-27 MED ORDER — LACTATED RINGERS IV SOLN: INTRAVENOUS | Status: DC

## 2023-03-27 MED ORDER — MISOPROSTOL 25 MCG QUARTER TABLET
25.0000 ug | ORAL_TABLET | Freq: Once | ORAL | Status: AC
  Administered 2023-03-27: 25 ug via VAGINAL
  Filled 2023-03-27: qty 1

## 2023-03-27 MED ORDER — SERTRALINE HCL 25 MG PO TABS
25.0000 mg | ORAL_TABLET | Freq: Every day | ORAL | Status: DC
  Administered 2023-03-27: 25 mg via ORAL
  Filled 2023-03-27 (×2): qty 1

## 2023-03-27 MED ORDER — FENTANYL CITRATE (PF) 100 MCG/2ML IJ SOLN
50.0000 ug | INTRAMUSCULAR | Status: DC | PRN
  Administered 2023-03-27: 100 ug via INTRAVENOUS
  Filled 2023-03-27: qty 2

## 2023-03-27 MED ORDER — PHENYLEPHRINE 80 MCG/ML (10ML) SYRINGE FOR IV PUSH (FOR BLOOD PRESSURE SUPPORT): 80.0000 ug | PREFILLED_SYRINGE | INTRAVENOUS | Status: DC | PRN

## 2023-03-27 MED ORDER — TERBUTALINE SULFATE 1 MG/ML IJ SOLN
0.2500 mg | Freq: Once | INTRAMUSCULAR | Status: AC | PRN
  Administered 2023-03-28: 0.25 mg via SUBCUTANEOUS
  Filled 2023-03-27: qty 1

## 2023-03-27 MED ORDER — HYDROXYZINE HCL 50 MG PO TABS: 50.0000 mg | ORAL_TABLET | Freq: Four times a day (QID) | ORAL | Status: DC | PRN

## 2023-03-27 MED ORDER — ONDANSETRON HCL 4 MG/2ML IJ SOLN
4.0000 mg | Freq: Four times a day (QID) | INTRAMUSCULAR | Status: DC | PRN
  Administered 2023-03-27: 4 mg via INTRAVENOUS
  Filled 2023-03-27: qty 2

## 2023-03-27 MED ORDER — SODIUM CHLORIDE 0.9 % IV SOLN
5.0000 10*6.[IU] | Freq: Once | INTRAVENOUS | Status: AC
  Administered 2023-03-27: 5 10*6.[IU] via INTRAVENOUS
  Filled 2023-03-27: qty 5

## 2023-03-27 MED ORDER — INSULIN ASPART 100 UNIT/ML IJ SOLN
0.0000 [IU] | INTRAMUSCULAR | Status: DC
  Administered 2023-03-27: 3 [IU] via SUBCUTANEOUS
  Administered 2023-03-27: 2 [IU] via SUBCUTANEOUS
  Administered 2023-03-28: 1 [IU] via SUBCUTANEOUS

## 2023-03-27 MED ORDER — DIPHENHYDRAMINE HCL 50 MG/ML IJ SOLN: 12.5000 mg | INTRAMUSCULAR | Status: DC | PRN

## 2023-03-27 MED ORDER — FENTANYL-BUPIVACAINE-NACL 0.5-0.125-0.9 MG/250ML-% EP SOLN
12.0000 mL/h | EPIDURAL | Status: DC | PRN
  Administered 2023-03-28: 12 mL/h via EPIDURAL
  Filled 2023-03-27: qty 250

## 2023-03-27 MED ORDER — MISOPROSTOL 50MCG HALF TABLET
50.0000 ug | ORAL_TABLET | Freq: Once | ORAL | Status: AC
  Administered 2023-03-27: 50 ug via ORAL
  Filled 2023-03-27: qty 1

## 2023-03-27 MED ORDER — LACTATED RINGERS IV SOLN
500.0000 mL | Freq: Once | INTRAVENOUS | Status: DC
Start: 2023-03-28 — End: 2023-03-28

## 2023-03-27 MED ORDER — NIFEDIPINE ER OSMOTIC RELEASE 30 MG PO TB24
30.0000 mg | ORAL_TABLET | Freq: Once | ORAL | Status: AC
  Administered 2023-03-27: 30 mg via ORAL
  Filled 2023-03-27: qty 1

## 2023-03-27 MED ORDER — PENICILLIN G POT IN DEXTROSE 60000 UNIT/ML IV SOLN
3.0000 10*6.[IU] | INTRAVENOUS | Status: DC
  Administered 2023-03-27 – 2023-03-28 (×6): 3 10*6.[IU] via INTRAVENOUS
  Filled 2023-03-27 (×6): qty 50

## 2023-03-27 MED ORDER — SODIUM CHLORIDE 0.9 % IV SOLN: 250.0000 mL | INTRAVENOUS | Status: DC | PRN

## 2023-03-27 MED ORDER — LACTATED RINGERS IV SOLN
500.0000 mL | INTRAVENOUS | Status: DC | PRN
  Administered 2023-03-27 – 2023-03-28 (×4): 500 mL via INTRAVENOUS

## 2023-03-27 MED ORDER — LIDOCAINE HCL (PF) 1 % IJ SOLN: 30.0000 mL | INTRAMUSCULAR | Status: DC | PRN

## 2023-03-27 MED ORDER — MISOPROSTOL 50MCG HALF TABLET
50.0000 ug | ORAL_TABLET | Freq: Once | ORAL | Status: AC
  Administered 2023-03-27: 50 ug via BUCCAL
  Filled 2023-03-27: qty 1

## 2023-03-27 MED ORDER — EPHEDRINE 5 MG/ML INJ: 10.0000 mg | INTRAVENOUS | Status: DC | PRN

## 2023-03-27 MED ORDER — ACETAMINOPHEN 325 MG PO TABS: 650.0000 mg | ORAL_TABLET | ORAL | Status: DC | PRN

## 2023-03-27 NOTE — Progress Notes (Signed)
 Kim Crosby is a 23 y.o. [phone] at [redacted]w[redacted]d by ultrasound admitted for induction of labor due to Non-reactive NST, Gestational diabetes, and Pre-eclamptic toxemia of pregnancy..  Subjective: Patient doing well. Excited to meet baby boy "Savion". Introductions exchanged.    Objective: BP (!) 149/93   Pulse (!) 104   Temp 97.8 F (36.6 C) (Oral)   Resp 18   Ht 5\' 3"  (1.6 m)   Wt 124.3 kg   LMP  (LMP Unknown)   SpO2 99%   BMI 48.54 kg/m  No intake/output data recorded. No intake/output data recorded.  FHT:  FHR: 150 bpm, variability: moderate,  accelerations:  Present,  decelerations:  Absent UC:   irregular, every 10 minutes SVE:   Dilation: Closed Effacement (%): Thick Station: Ballotable Exam by:: Dorathy Daft, CNM,  Labs: Lab Results  Component Value Date   WBC 8.6 03/26/2023   HGB 11.5 (L) 03/26/2023   HCT 35.1 (L) 03/26/2023   MCV 81.8 03/26/2023   PLT 278 03/26/2023   CNM called to patient bedside to confirm presentation concern for oblique. Dr. Donavan Foil also called to patient bedside and confirmed vertex.  CBG (last 3)  Recent Labs    03/27/23 0419 03/27/23 0759 03/27/23 1131  GLUCAP 86 100* 136*    Assessment / Plan: Induction of labor due to preeclampsia, non-reassuring fetal testing, and gestational diabetes,  progressing well on pitocin  Labor:  Dual Cyotec given.   50 buccal 25 vag.  Preeclampsia:  no signs or symptoms of toxicity labs stable.  Fetal Wellbeing:  Category I Pain Control:  Labor support without medications and Epidural I/D:   GBS positive < PCN  Anticipated MOD:  NSVD  Claudette Head, CNM 03/27/2023, 11:52 AM

## 2023-03-27 NOTE — Progress Notes (Signed)
 Kim Crosby is a 23 y.o. [id number] at [redacted]w[redacted]d by ultrasound admitted for induction of labor due to Non-reactive NST GDM and PreE.  Subjective: Patient doing well. She is starting to feel cramping but intermittently.   Objective: BP 132/85   Pulse 91   Temp 97.9 F (36.6 C) (Oral)   Resp 17   Ht 5\' 3"  (1.6 m)   Wt 124.3 kg   LMP  (LMP Unknown)   SpO2 99%   BMI 48.54 kg/m  No intake/output data recorded. No intake/output data recorded.  FHT:  FHR: 150 bpm, variability: moderate,  accelerations:  Present,  decelerations:  Absent UC:   irregular, every 5-7 minutes SVE:   Dilation: 2.5 Effacement (%): 70 Station: Ballotable Exam by:: Rhunette Croft CNM   Labs: Lab Results  Component Value Date   WBC 8.6 03/26/2023   HGB 11.5 (L) 03/26/2023   HCT 35.1 (L) 03/26/2023   MCV 81.8 03/26/2023   PLT 278 03/26/2023   CBG (last 3)  Recent Labs    03/27/23 1321 03/27/23 1627 03/27/23 1845  GLUCAP 121* 78 169*   CNM to patient bedside to discuss FB placement. Reviewed risks and benefits with patient and FOB. Patient desires to proceed with FB. FHT Cat 1 prior to AROM. SVE 2/70/Ballottable  S. Suzie Portela CNM. Fetal head well applied to cervix, FB placed with minimal difficulty and filled with 60cc of fluid. Patient and fetus tolerated well and FHT remained Cat 1 following placement   Assessment / Plan: Induction of labor due to NRNST GDM and PreE,  progressing well s/p cytotec.   Labor:  FB placed, and plan to AROM following expulsion.  Preeclampsia:  no signs or symptoms of toxicity CBG: sliding scale. Patient reports that she ate 3 pieces of toast and peaches. Light laboring diet discontinued.  Fetal Wellbeing:  Category I Pain Control:  Labor support without medications and IV pain meds I/D:   GBS positive < PCN  Anticipated MOD:  NSVD  Claudette Head, CNM 03/27/2023, 8:13 PM

## 2023-03-28 ENCOUNTER — Encounter (HOSPITAL_COMMUNITY): Payer: Self-pay | Admitting: Obstetrics and Gynecology

## 2023-03-28 ENCOUNTER — Encounter (HOSPITAL_COMMUNITY)
Admission: AD | Disposition: A | Discharge status: Home / Self Care | Payer: Self-pay | Source: Home / Self Care | Attending: Obstetrics and Gynecology

## 2023-03-28 ENCOUNTER — Inpatient Hospital Stay (HOSPITAL_COMMUNITY): Admitting: Anesthesiology

## 2023-03-28 DIAGNOSIS — O99344 Other mental disorders complicating childbirth: Secondary | ICD-10-CM

## 2023-03-28 DIAGNOSIS — O403XX0 Polyhydramnios, third trimester, not applicable or unspecified: Secondary | ICD-10-CM

## 2023-03-28 DIAGNOSIS — O368330 Maternal care for abnormalities of the fetal heart rate or rhythm, third trimester, not applicable or unspecified: Secondary | ICD-10-CM

## 2023-03-28 DIAGNOSIS — Z3A36 36 weeks gestation of pregnancy: Secondary | ICD-10-CM

## 2023-03-28 DIAGNOSIS — O1404 Mild to moderate pre-eclampsia, complicating childbirth: Secondary | ICD-10-CM

## 2023-03-28 DIAGNOSIS — O24424 Gestational diabetes mellitus in childbirth, insulin controlled: Secondary | ICD-10-CM

## 2023-03-28 LAB — GLUCOSE, CAPILLARY
Glucose-Capillary: 93 mg/dL (ref 70–99)
Glucose-Capillary: 85 mg/dL (ref 70–99)
Glucose-Capillary: 100 mg/dL — ABNORMAL HIGH (ref 70–99)

## 2023-03-28 LAB — CBC
HCT: 33.6 % — ABNORMAL LOW (ref 36.0–46.0)
HCT: 35.5 % — ABNORMAL LOW (ref 36.0–46.0)
Hemoglobin: 11 g/dL — ABNORMAL LOW (ref 12.0–15.0)
Hemoglobin: 11.8 g/dL — ABNORMAL LOW (ref 12.0–15.0)
MCH: 26.8 pg (ref 26.0–34.0)
MCH: 27.3 pg (ref 26.0–34.0)
MCHC: 32.7 g/dL (ref 30.0–36.0)
MCHC: 33.2 g/dL (ref 30.0–36.0)
MCV: 82 fL (ref 80.0–100.0)
MCV: 82 fL (ref 80.0–100.0)
Platelets: 276 10*3/uL (ref 150–400)
Platelets: 296 10*3/uL (ref 150–400)
RBC: 4.1 MIL/uL (ref 3.87–5.11)
RBC: 4.33 MIL/uL (ref 3.87–5.11)
RDW: 15.8 % — ABNORMAL HIGH (ref 11.5–15.5)
RDW: 15.9 % — ABNORMAL HIGH (ref 11.5–15.5)
WBC: 7.7 10*3/uL (ref 4.0–10.5)
WBC: 8.9 10*3/uL (ref 4.0–10.5)
nRBC: 0 % (ref 0.0–0.2)
nRBC: 0.2 % (ref 0.0–0.2)

## 2023-03-28 SURGERY — Surgical Case
Anesthesia: Regional

## 2023-03-28 MED ORDER — COCONUT OIL OIL: 1.0000 | TOPICAL_OIL | Status: DC | PRN

## 2023-03-28 MED ORDER — OXYCODONE HCL 5 MG PO TABS
5.0000 mg | ORAL_TABLET | ORAL | Status: DC | PRN
  Administered 2023-03-28: 5 mg via ORAL
  Filled 2023-03-28: qty 1

## 2023-03-28 MED ORDER — SENNOSIDES-DOCUSATE SODIUM 8.6-50 MG PO TABS
2.0000 | ORAL_TABLET | Freq: Every day | ORAL | Status: DC
  Administered 2023-03-29 – 2023-03-30 (×2): 2 via ORAL
  Filled 2023-03-28 (×2): qty 2

## 2023-03-28 MED ORDER — DEXTROSE INFANT ORAL GEL 40%: 0.5000 mL/kg | ORAL | Status: DC | PRN

## 2023-03-28 MED ORDER — FENTANYL-BUPIVACAINE-NACL 0.5-0.125-0.9 MG/250ML-% EP SOLN: 12.0000 mL/h | EPIDURAL | Status: DC | PRN

## 2023-03-28 MED ORDER — PHENYLEPHRINE 80 MCG/ML (10ML) SYRINGE FOR IV PUSH (FOR BLOOD PRESSURE SUPPORT): 80.0000 ug | PREFILLED_SYRINGE | INTRAVENOUS | Status: DC | PRN

## 2023-03-28 MED ORDER — CEFAZOLIN SODIUM-DEXTROSE 3-4 GM/150ML-% IV SOLN
3.0000 g | Freq: Once | INTRAVENOUS | Status: DC
  Administered 2023-03-28: 3 g via INTRAVENOUS
  Filled 2023-03-28: qty 150

## 2023-03-28 MED ORDER — ONDANSETRON HCL 4 MG PO TABS: 4.0000 mg | ORAL_TABLET | ORAL | Status: DC | PRN

## 2023-03-28 MED ORDER — ACETAMINOPHEN 325 MG PO TABS
650.0000 mg | ORAL_TABLET | ORAL | Status: DC | PRN
  Administered 2023-03-28 – 2023-03-29 (×2): 650 mg via ORAL
  Filled 2023-03-28 (×2): qty 2

## 2023-03-28 MED ORDER — ZOLPIDEM TARTRATE 5 MG PO TABS: 5.0000 mg | ORAL_TABLET | Freq: Every evening | ORAL | Status: DC | PRN

## 2023-03-28 MED ORDER — LIDOCAINE HCL (PF) 1 % IJ SOLN
INTRAMUSCULAR | Status: DC | PRN
  Administered 2023-03-28: 8 mL via EPIDURAL

## 2023-03-28 MED ORDER — DIPHENHYDRAMINE HCL 50 MG/ML IJ SOLN: 12.5000 mg | INTRAMUSCULAR | Status: DC | PRN

## 2023-03-28 MED ORDER — DIBUCAINE (PERIANAL) 1 % EX OINT: 1.0000 | TOPICAL_OINTMENT | CUTANEOUS | Status: DC | PRN

## 2023-03-28 MED ORDER — IBUPROFEN 600 MG PO TABS
600.0000 mg | ORAL_TABLET | Freq: Four times a day (QID) | ORAL | Status: DC
  Administered 2023-03-28 – 2023-03-30 (×9): 600 mg via ORAL
  Filled 2023-03-28 (×9): qty 1

## 2023-03-28 MED ORDER — TETANUS-DIPHTH-ACELL PERTUSSIS 5-2.5-18.5 LF-MCG/0.5 IM SUSY: 0.5000 mL | PREFILLED_SYRINGE | Freq: Once | INTRAMUSCULAR | Status: DC

## 2023-03-28 MED ORDER — EPHEDRINE 5 MG/ML INJ: 10.0000 mg | INTRAVENOUS | Status: DC | PRN

## 2023-03-28 MED ORDER — ONDANSETRON HCL 4 MG/2ML IJ SOLN: 4.0000 mg | INTRAMUSCULAR | Status: DC | PRN

## 2023-03-28 MED ORDER — LACTATED RINGERS IV SOLN
500.0000 mL | Freq: Once | INTRAVENOUS | Status: DC
Start: 2023-03-28 — End: 2023-03-28

## 2023-03-28 MED ORDER — DIPHENHYDRAMINE HCL 25 MG PO CAPS: 25.0000 mg | ORAL_CAPSULE | Freq: Four times a day (QID) | ORAL | Status: DC | PRN

## 2023-03-28 MED ORDER — TERBUTALINE SULFATE 1 MG/ML IJ SOLN: 0.2500 mg | Freq: Once | INTRAMUSCULAR | Status: DC | PRN

## 2023-03-28 MED ORDER — WITCH HAZEL-GLYCERIN EX PADS: 1.0000 | MEDICATED_PAD | CUTANEOUS | Status: DC | PRN

## 2023-03-28 MED ORDER — BENZOCAINE-MENTHOL 20-0.5 % EX AERO
1.0000 | INHALATION_SPRAY | CUTANEOUS | Status: DC | PRN
  Filled 2023-03-28: qty 56

## 2023-03-28 MED ORDER — SERTRALINE HCL 25 MG PO TABS
25.0000 mg | ORAL_TABLET | Freq: Every day | ORAL | Status: DC
  Administered 2023-03-28 – 2023-03-30 (×3): 25 mg via ORAL
  Filled 2023-03-28 (×3): qty 1

## 2023-03-28 MED ORDER — SIMETHICONE 80 MG PO CHEW: 80.0000 mg | CHEWABLE_TABLET | ORAL | Status: DC | PRN

## 2023-03-28 MED ORDER — LACTATED RINGERS AMNIOINFUSION: INTRAVENOUS | Status: DC

## 2023-03-28 MED ORDER — PRENATAL MULTIVITAMIN CH
1.0000 | ORAL_TABLET | Freq: Every day | ORAL | Status: DC
  Administered 2023-03-29 – 2023-03-30 (×2): 1 via ORAL
  Filled 2023-03-28 (×2): qty 1

## 2023-03-28 MED ORDER — OXYTOCIN-SODIUM CHLORIDE 30-0.9 UT/500ML-% IV SOLN: 1.0000 m[IU]/min | INTRAVENOUS | Status: DC

## 2023-03-28 NOTE — Discharge Summary (Signed)
 Postpartum Discharge Summary  Date of Service updated***     Patient Name: Kim Crosby DOB: 03-14-2000 MRN: [id number]  Date of admission: 03/26/2023 Delivery date:03/28/2023 Delivering provider: Conan Bowens Date of discharge: 03/28/2023  Admitting diagnosis: Encounter for induction of labor [Z34.90] Intrauterine pregnancy: [redacted]w[redacted]d     Secondary diagnosis:  Active Problems:   Gestational diabetes mellitus in pregnancy, controlled by oral hypoglycemic drugs   MDD (major depressive disorder), severe (HCC)   Preeclampsia   Encounter for induction of labor  Additional problems:     Discharge diagnosis: Term Pregnancy Delivered, Preeclampsia (mild), and uncontrolled gDM                                               Post partum procedures:{Postpartum procedures:23558} Augmentation: AROM, Pitocin, and Cytotec, foley Complications: None  Hospital course: Induction of Labor With Vaginal Delivery   23 y.o. yo [phone] at [redacted]w[redacted]d was admitted to the hospital 03/26/2023 for induction of labor.  Indication for induction:  non-reassuring fetal heart tracing, poor controlled gestational diabetes .  Patient had an labor course complicated by recurrent decels Membrane Rupture Time/Date: 1:37 AM,03/28/2023  Delivery Method:Vaginal, Vacuum (Extractor) Operative Delivery:Device used:Kiwi Indication: Fetal indications Episiotomy: None Lacerations:  None Details of delivery can be found in separate delivery note.  Patient had a postpartum course complicated by***. Patient is discharged home 03/28/23.  Newborn Data: Birth date:03/28/2023 Birth time:11:52 AM Gender:Female Living status:Living Apgars:8 ,9  Weight:2740 g  Magnesium Sulfate received: {Mag received:30440022} BMZ received: {BMZ received:30440023} Rhophylac:{Rhophylac received:30440032} WUX:{LKG:40102725} T-DaP:{Tdap:23962} Flu: {DGU:44034} RSV Vaccine received: {RSV:31013} Transfusion:{Transfusion  received:30440034}  Immunizations received: Immunization History  Administered Date(s) Administered   DTaP 09/05/1998, 01/01/1999, 03/20/1999, 10/18/1999, 09/01/2003   Dtap, Unspecified 09/01/2003   HIB (PRP-OMP) 09/05/1998, 01/01/1999, 03/20/1999, 10/18/1999   Hepatitis A 04/28/2006   Hepatitis A, Ped/Adol-2 Dose 03/29/2009   Hepatitis B 17-Sep-1998, 09/05/1998, 01/01/1999, 10/18/1999   Hepatitis B, PED/ADOLESCENT 09-25-1998, 09/05/1998, 01/01/1999, 10/18/1999   IPV 09/05/1998, 01/01/1999, 10/18/1999, 09/01/2003, 09/01/2003   MMR 10/18/1999, 09/01/2003   Meningococcal B, OMV 06/24/2016, 08/11/2016   Meningococcal Conjugate 07/31/2009, 07/31/2009, 06/24/2016   Pneumococcal Conjugate,unspecified 03/20/1999, 04/30/1999, 10/18/1999   Rsv, Bivalent, Protein Subunit Rsvpref,pf Verdis Frederickson) 02/27/2023   Tdap 07/31/2009, 02/09/2023   Varicella 10/18/1999, 04/28/2006    Physical exam  Vitals:   03/28/23 1239 03/28/23 1246 03/28/23 1302 03/28/23 1316  BP:  (!) 152/94 (!) 143/84 (!) 146/96  Pulse:  100 (!) 106 (!) 102  Resp: 18  18 20   Temp:      TempSrc:      SpO2:      Weight:      Height:       General: {Exam; general:21111117} Lochia: {Desc; appropriate/inappropriate:30686::"appropriate"} Uterine Fundus: {Desc; firm/soft:30687} Incision: {Exam; incision:21111123} DVT Evaluation: {Exam; dvt:2111122} Labs: Lab Results  Component Value Date   WBC 8.9 03/28/2023   HGB 11.8 (L) 03/28/2023   HCT 35.5 (L) 03/28/2023   MCV 82.0 03/28/2023   PLT 296 03/28/2023      Latest Ref Rng & Units 03/26/2023    7:25 PM  CMP  Glucose 70 - 99 mg/dL 742   BUN 6 - 20 mg/dL 13   Creatinine 5.95 - 1.00 mg/dL 6.38   Sodium 756 - 433 mmol/L 135   Potassium 3.5 - 5.1 mmol/L 3.5   Chloride 98 - 111 mmol/L 104  CO2 22 - 32 mmol/L 18   Calcium 8.9 - 10.3 mg/dL 9.3   Total Protein 6.5 - 8.1 g/dL 6.1   Total Bilirubin 0.0 - 1.2 mg/dL 0.5   Alkaline Phos 38 - 126 U/L 77   AST 15 - 41 U/L 17   ALT  0 - 44 U/L 13    Edinburgh Score:    01/18/2020    9:13 AM  Edinburgh Postnatal Depression Scale Screening Tool  I have been able to laugh and see the funny side of things. 1  I have looked forward with enjoyment to things. 1  I have blamed myself unnecessarily when things went wrong. 2  I have been anxious or worried for no good reason. 2  I have felt scared or panicky for no good reason. 1  Things have been getting on top of me. 2  I have been so unhappy that I have had difficulty sleeping. 2  I have felt sad or miserable. 1  I have been so unhappy that I have been crying. 1  The thought of harming myself has occurred to me. 0  Edinburgh Postnatal Depression Scale Total 13   No data recorded  After visit meds:  Allergies as of 03/28/2023       Reactions   Pork-derived Products Other (See Comments)   Life style preference Other reaction(s): Other (See Comments) Life style preference     Med Rec must be completed prior to using this [facility]***        Discharge home in stable condition Infant Feeding: Bottle and Breast Infant Disposition:{CHL IP OB HOME WITH ZOXWRU:04540} Discharge instruction: per After Visit Summary and Postpartum booklet. Activity: Advance as tolerated. Pelvic rest for 6 weeks.  Diet: {OB JWJX:91478295} Future Appointments: Future Appointments  Date Time Provider Department Center  04/02/2023  1:15 PM Adam Phenix, MD [facility]   Follow up Visit:   Please schedule this patient for a {Visit type:23955} postpartum visit in {Postpartum visit:23953} with the following provider: {Provider type:23954}. Additional Postpartum F/U:{PP Procedure:23957}  {Risk level:23960} pregnancy complicated by: {complication:23959} Delivery mode:  Vaginal, Vacuum (Extractor) Anticipated Birth Control:  {Birth Control:23956}   03/28/2023 Conan Bowens, MD

## 2023-03-28 NOTE — Progress Notes (Signed)
 Faculty Note  S: In to meet patient, she is comfortable with epidural.   O: BP (!) 144/84   Pulse (!) 121   Temp (!) 97.3 F (36.3 C) (Oral)   Resp 20   Ht 5\' 3"  (1.6 m)   Wt 124.3 kg   LMP  (LMP Unknown)   SpO2 99%   BMI 48.54 kg/m   Gen: alert, oriented SVE: last check 6.5  FHT: 130 bpm, moderate variability, accels none present, variable and early decels Toco: ctx every 4-5 min   A/P: Pt is 23 y.o. [id number] @ [redacted]w[redacted]d who is admitted for induction of labor for non-reassuring fetal tracing, mild pre-eclampsia and poorly controlled gestational diabetes. Labor complicated by variable decels intermittently, had prolonged decel about an hour ago improved with terbutaline, unclear how much was baby vs tracing mom, FSE/amnioinfusion placed successfully and now tracing baby well. Made slow cervical change from 5 > 6.5 cm overnight.  Reviewed course with patient, concern for fetal well being although with moderate variability and occasional variable decel now, feel baby is stable to continue induction. Reviewed that if baby has another prolonged decel, or worsening FHT, may recommend proceeding with c-section. Recommend we start pitocin to improve contraction pattern, reviewed that if this worsened fetal well being, we may need to d/c pitocin. Reviewed that if she does not make cervical change, would recommend proceeding with c-section. She verbalizes understanding of all the above and is agreeable with starting pitocin.     Baldemar Lenis, MD, [facility] Attending Center for [employer] [facility])

## 2023-03-28 NOTE — Progress Notes (Signed)
 LABOR PROGRESS NOTE  Patient Name: Kim Crosby, female   DOB: 25-Mar-2000, 23 y.o.  MRN: [id number]  [id number] at [redacted]w[redacted]d admitted for IOL for GDM and preeclampsia  S: Coping well. Discussed role, risks, benefit of AROM; pt agreeable  O:  BP (!) 147/94   Pulse 94   Temp 98.1 F (36.7 C) (Oral)   Resp 16   Ht 5\' 3"  (1.6 m)   Wt 124.3 kg   LMP  (LMP Unknown)   SpO2 99%   BMI 48.54 kg/m  EFM:145 bpm/Moderate variability/ 15x15 accels/ None decels CAT: 1 Toco: irregular, every 1.5-5 minutes   CVE: Dilation: 6 Effacement (%): 80 Station: -3, -2 Presentation: Vertex Exam by:: Dr Leanora Cover   A&P:   #Labor: Progressing well. AROM clear copious, fetal head slowly guided to -2 #Pain: Epidural #FWB: CAT 2 #GBS positive; PCN #Anticipate vaginal delivery   Wyn Forster, MD FMOB Fellow, Faculty practice [facility], Center for [facility] 03/28/23  3:58 AM

## 2023-03-28 NOTE — Anesthesia Procedure Notes (Signed)
 Epidural Patient location during procedure: OB Start time: 03/28/2023 12:51 AM End time: 03/28/2023 12:58 AM  Staffing Anesthesiologist: Lannie Fields, DO  Preanesthetic Checklist Completed: patient identified, IV checked, site marked, risks and benefits discussed, surgical consent, monitors and equipment checked, pre-op evaluation and timeout performed  Epidural Patient position: sitting Prep: DuraPrep and site prepped and draped Patient monitoring: continuous pulse ox and blood pressure Approach: midline Location: L3-L4 Injection technique: LOR air  Needle:  Needle type: Tuohy  Needle gauge: 17 G Needle length: 9 cm and 9 Needle insertion depth: 9 cm Catheter type: closed end flexible Catheter size: 19 Gauge Catheter at skin depth: 15 cm Test dose: negative  Assessment Events: blood not aspirated, no cerebrospinal fluid, injection not painful, no injection resistance, no paresthesia and negative IV test

## 2023-03-28 NOTE — Progress Notes (Signed)
 Labor Progress Note Kim Crosby is a 23 y.o. [phone] at [redacted]w[redacted]d presented for IOL for preeclampsia and GDM S: Comfortable with epidural. Called to the room for deep recurrent variables. Discussed risks, benefits, indications for IUPC and amnioinfusion; pt agreeable.   O:  BP (!) 147/94   Pulse 94   Temp 98.1 F (36.7 C) (Oral)   Resp 16   Ht 5\' 3"  (1.6 m)   Wt 124.3 kg   LMP  (LMP Unknown)   SpO2 99%   BMI 48.54 kg/m   EFM: baseline 145, accels, recurrent variable decels 70s-100s, moderate variability TOCO: no contractions  CVE: Dilation: 5.5 Effacement (%): 60 Station: Ballotable Presentation: Vertex (acyclidic) Exam by:: Dr Leanora Cover   A&P: 23 y.o. [id number] [redacted]w[redacted]d admitted for IOL #Labor: Progressing well. IUPC placed. Amnioinfusion started; bolus then maintenance #Pain: Epidural #FWB: Cat II #GBS positive; PCN  #GDM:  CBG (last 3)  Recent Labs    03/27/23 1845 03/27/23 2246 03/28/23 0323  GLUCAP 169* 70 93   #Preeclampsia: mild range BP; Plt 296, AST 17, ALT 13, Cr 0.48, Urine pro/cr 1.48  Wyn Forster, MD 3:49 AM

## 2023-03-28 NOTE — Anesthesia Postprocedure Evaluation (Signed)
 Anesthesia Post Note  Patient: Maila Dukes Sheperd  Procedure(s) Performed: AN AD HOC LABOR EPIDURAL     Patient location during evaluation: Mother Baby Anesthesia Type: Epidural Level of consciousness: awake and alert Pain management: pain level controlled Vital Signs Assessment: post-procedure vital signs reviewed and stable Respiratory status: spontaneous breathing, nonlabored ventilation and respiratory function stable Cardiovascular status: stable Postop Assessment: no headache, no backache and epidural receding Anesthetic complications: no   No notable events documented.  Last Vitals:  Vitals:   03/28/23 1430 03/28/23 1530  BP: 127/79 112/75  Pulse: 95 86  Resp: 18 18  Temp: 36.7 C 36.7 C  SpO2:  99%    Last Pain:  Vitals:   03/28/23 1354  TempSrc:   PainSc: 0-No pain   Pain Goal:                   Jarvin Ogren

## 2023-03-28 NOTE — Anesthesia Preprocedure Evaluation (Signed)
 Anesthesia Evaluation  Patient identified by MRN, date of birth, ID band Patient awake    Reviewed: Allergy & Precautions, H&P , NPO status , Patient's Chart, lab work & pertinent test results, reviewed documented beta blocker date and time   Airway Mallampati: II  TM Distance: >3 FB Neck ROM: full    Dental no notable dental hx. (+) Teeth Intact, Dental Advisory Given   Pulmonary neg pulmonary ROS   Pulmonary exam normal breath sounds clear to auscultation       Cardiovascular hypertension, Pt. on medications negative cardio ROS Normal cardiovascular exam Rhythm:regular Rate:Normal     Neuro/Psych  PSYCHIATRIC DISORDERS Anxiety Depression Bipolar Disorder   negative neurological ROS  negative psych ROS   GI/Hepatic negative GI ROS, Neg liver ROS,,,  Endo/Other  diabetes, Gestational  Class 3 obesity  Renal/GU negative Renal ROS  negative genitourinary   Musculoskeletal   Abdominal   Peds  Hematology negative hematology ROS (+) Blood dyscrasia, anemia   Anesthesia Other Findings   Reproductive/Obstetrics (+) Pregnancy                             Anesthesia Physical Anesthesia Plan  ASA: 3  Anesthesia Plan: Epidural   Post-op Pain Management: Minimal or no pain anticipated   Induction: Intravenous  PONV Risk Score and Plan:   Airway Management Planned:   Additional Equipment:   Intra-op Plan:   Post-operative Plan:   Informed Consent: I have reviewed the patients History and Physical, chart, labs and discussed the procedure including the risks, benefits and alternatives for the proposed anesthesia with the patient or authorized representative who has indicated his/her understanding and acceptance.       Plan Discussed with: Anesthesiologist and CRNA  Anesthesia Plan Comments:        Anesthesia Quick Evaluation

## 2023-03-28 NOTE — Lactation Note (Signed)
 This note was copied from a baby's chart. Lactation Consultation Note  Patient Name: Kim Crosby Today's Date: 03/28/2023  Reason for consult: Initial assessment;Early term 37-38.6wks;Mother's request  P2, 37 wks, @ 3 hrs of life. Mom request help with her hand pump- new pump provided- first one not working properly. Offered to put baby to breast- mom receptive. Large breasts-  fed off both breasts in football positioning. Demonstrated starting with hand expression. Demonstrated steps of latching- infant responded well. Fed for 15+ minutes on each breast- 30+ overall. Lab drew blood sugar near feed completion. Mom slept through much of feed. Discussed expectations @ breast- Day 1- sleepy/ feed every 3 hrs/ even 10 minutes is okay, Day 2 more awake/ feeding cues/longer feeds, and cluster feeding overnights brings milk in. Highlighted breast stimulation is tied directly to milk production. Discussed hands on breast and baby, keeping baby awake @ breast. Starting with hand expression & breast compression to get baby working @ breast, and gentle stimulation to keep baby working @ breast. Encouraged EBM for nipple care post feed. LC services and milk storage shared. Encouraged mom to call for assist anytime desired.   Maternal Data Has patient been taught Hand Expression?: Yes  Feeding Mother's Current Feeding Choice: Breast Milk  LATCH Score Latch: Grasps breast easily, tongue down, lips flanged, rhythmical sucking.  Audible Swallowing: Spontaneous and intermittent  Type of Nipple: Everted at rest and after stimulation  Comfort (Breast/Nipple): Soft / non-tender  Hold (Positioning): Full assist, staff holds infant at breast  LATCH Score: 8   Lactation Tools Discussed/Used    Interventions Interventions: Breast feeding basics reviewed;Assisted with latch;Hand express;Breast compression;Support pillows;Hand pump;Education;LC Services brochure;CDC milk storage  guidelines  Discharge Pump: Manual;Personal (Hand pump provided, no other pump @ home per mom)  Consult Status Consult Status: Follow-up Date: 03/29/23 Follow-up type: In-patient    [facility] 03/28/2023, 4:26 PM

## 2023-03-29 LAB — CULTURE, BETA STREP (GROUP B ONLY): Strep Gp B Culture: POSITIVE — AB

## 2023-03-29 LAB — CBC
HCT: 29.4 % — ABNORMAL LOW (ref 36.0–46.0)
Hemoglobin: 9.7 g/dL — ABNORMAL LOW (ref 12.0–15.0)
MCH: 27 pg (ref 26.0–34.0)
MCHC: 33 g/dL (ref 30.0–36.0)
MCV: 81.9 fL (ref 80.0–100.0)
Platelets: 231 10*3/uL (ref 150–400)
RBC: 3.59 MIL/uL — ABNORMAL LOW (ref 3.87–5.11)
RDW: 16.1 % — ABNORMAL HIGH (ref 11.5–15.5)
WBC: 11.2 10*3/uL — ABNORMAL HIGH (ref 4.0–10.5)
nRBC: 0.2 % (ref 0.0–0.2)

## 2023-03-29 LAB — GLUCOSE, CAPILLARY: Glucose-Capillary: 118 mg/dL — ABNORMAL HIGH (ref 70–99)

## 2023-03-29 MED ORDER — FLUCONAZOLE 150 MG PO TABS
150.0000 mg | ORAL_TABLET | Freq: Once | ORAL | Status: AC
  Administered 2023-03-29: 150 mg via ORAL
  Filled 2023-03-29: qty 1

## 2023-03-29 MED ORDER — METRONIDAZOLE 500 MG PO TABS
500.0000 mg | ORAL_TABLET | Freq: Two times a day (BID) | ORAL | Status: DC
  Administered 2023-03-29 – 2023-03-30 (×2): 500 mg via ORAL
  Filled 2023-03-29 (×3): qty 1

## 2023-03-29 MED ORDER — FERROUS SULFATE 325 (65 FE) MG PO TABS
325.0000 mg | ORAL_TABLET | ORAL | Status: DC
  Administered 2023-03-29: 325 mg via ORAL
  Filled 2023-03-29: qty 1

## 2023-03-29 MED ORDER — FUROSEMIDE 20 MG PO TABS
20.0000 mg | ORAL_TABLET | Freq: Every day | ORAL | Status: DC
  Administered 2023-03-29 – 2023-03-30 (×2): 20 mg via ORAL
  Filled 2023-03-29 (×2): qty 1

## 2023-03-29 MED ORDER — POTASSIUM CHLORIDE CRYS ER 20 MEQ PO TBCR
20.0000 meq | EXTENDED_RELEASE_TABLET | Freq: Every day | ORAL | Status: DC
  Administered 2023-03-29 – 2023-03-30 (×2): 20 meq via ORAL
  Filled 2023-03-29 (×2): qty 1

## 2023-03-29 NOTE — Progress Notes (Signed)
 POSTPARTUM PROGRESS NOTE  Post Partum Day 1  Subjective:  Kim Crosby is a 23 y.o. [id number] s/p SVD at [redacted]w[redacted]d.  She reports she is doing well. No acute events overnight. She denies any problems with ambulating, voiding or po intake. Denies nausea or vomiting.  Pain is moderately controlled.  Lochia is appropriate.  Objective: Blood pressure 134/75, pulse 100, temperature (!) 97.5 F (36.4 C), temperature source Oral, resp. rate 18, height 5\' 3"  (1.6 m), weight 124.3 kg, SpO2 97%, unknown if currently breastfeeding.  Physical Exam:  General: alert, cooperative and no distress Chest: no respiratory distress Heart:regular rate, distal pulses intact Uterine Fundus: firm, appropriately tender DVT Evaluation: No calf swelling or tenderness Extremities: trace edema Skin: warm, dry  Recent Labs    03/28/23 1252 03/29/23 0537  HGB 11.0* 9.7*  HCT 33.6* 29.4*    Assessment/Plan: Kim Crosby is a 23 y.o. [id number] s/p SVD at [redacted]w[redacted]d   PPD#1 - Doing well  Routine postpartum care Mild PreE - normotensive this morning, added lasix + K  Acute blood loss anemia, clinically significant - Hgb 9.7, start oral iron Contraception: unsure Feeding: both  Dispo: Plan for discharge 3/17 if meeting all goals.   LOS: 3 days   Hessie Dibble, MD OB Fellow  03/29/2023, 10:53 AM

## 2023-03-29 NOTE — Clinical Social Work Maternal (Signed)
 CLINICAL SOCIAL WORK MATERNAL/CHILD NOTE  Patient Details  Name: Kim Crosby MRN: [id number] Date of Birth: 05-19-2000  Date:  03/29/2023  Clinical Social Worker Initiating Note:  Kim Crosby Date/Time: Initiated:  03/29/23/1547     Child's Name:  Kim Crosby (DOB: 03/28/2025)   Biological Parents:  Mother, Father (FOB: Kim Crosby, DOB: 09/12/1997)   Need for Interpreter:  None   Reason for Referral:  Behavioral Health Concerns   Address:  [address] [place] [address] (Mailing address)  [address] [place], [place] [address]   Phone number:  [phone] (home)     Additional phone number:   Household Members/Support Persons (HM/SP):   Household Member/Support Person 1, Household Member/Support Person 2   HM/SP Name Relationship DOB or Age  HM/SP -1 Kim Crosby FOB/Significant Other 09/12/1997  HM/SP -2 Kim Crosby Son 10/31/2019  HM/SP -3        HM/SP -4        HM/SP -5        HM/SP -6        HM/SP -7        HM/SP -8          Natural Supports (not living in the home):  Immediate Family   Professional Supports: Other (Comment), Systems analyst)   Employment: Consulting civil engineer   Type of Work:     Education:  Attending college   Homebound arranged:    Surveyor, quantity Resources:  Medicaid   Other Resources:  Sales executive   (WIC referral placed during consult)   Cultural/Religious Considerations Which May Impact Care:  Per chart review, MOB identifies an non-denominational.  Strengths:  Ability to meet basic needs  , Home prepared for child  , Psychotropic Medications, Pediatrician chosen   Psychotropic Medications:  Zoloft      Pediatrician:    Armed forces operational officer area  Pediatrician List:   [facility]  [place]    [place]    [place] [facility]      Pediatrician Fax Number:    Risk Factors/Current Problems:  Mental Health Concerns  , Transportation     Cognitive State:  Linear  Thinking  , Able to Concentrate  , Alert  , Goal Oriented     Mood/Affect:  Calm  , Comfortable  , Relaxed  , Interested     CSW Assessment: CSW was consulted due to diagnoses of Bipolar Disorder II, anxiety, and depression. CSW met with MOB at bedside to complete assessment. When CSW entered room, MOB was observed sitting in hospital bed. Infant was observed sleeping on his back in bassinet. CSW introduced self and explained reason for consult. MOB presented as calm, was agreeable to consult and remained engaged throughout encounter.   CSW inquired about demographic information listed in chart. MOB reports she currently lives at [address] [place], [place] [address] with FOB/her boyfriend and her son, Kim Crosby. MOB reports she uses her mother's address ([address] [place], [place] [address]) as her permanent/mailing address. MOB shares she is enrolled full time at [employer]. MOB shared that she plans to contact [employer] of Students at [employer] and her professors to inquire about being granted an extension on her coursework while she recovers postpartum.   CSW inquired how MOB is feeling since infant's arrival. MOB shared about recent situational stressors, marked being induced early and FOB not being able to be present for infant's birth due to her  quick and unanticipated progression at the end of her labor. MOB shared how her son is staying with his grandmother and expressed how she misses him. CSW inquired about MOB's mental health history. MOB acknowledged a previous diagnosis of Bipolar disorder II, stating she was diagnosed in 2018. MOB shared that she has more so endorsed symtpoms of depression but "can be a little manic at times." When asked about endorsing symptoms of hypermania, MOB reports that before she experiences a depressive episode, she can "be hopeful about things and become really interested in something to distract (herself) from being sad." MOB reports she cannot recall when she last experienced a  hypermanic episode and feels it last occurred "a long time ago." CSW inquired about diagnoses of depression and anxiety. MOB reports she was diagnosed with depression during a 4 day inpatient psychiatric hospitalization February 2024 at [facility] after an alleged suicide attempt by overdosing on medication. MOB reports that she did ingest medications but did not feel that she was attempting to end her life at the time. MOB reports she feels her symptoms of depression have improved since how she felt when admitted for inpatient hospitalization. CSW inquired about a history of postpartum depression/anxiety after the birth of her other son. MOB recalls endorsing symptoms of postpartum depression after the birth of her son in 2021 and attributed symptoms to having limited support at the time and living in a different city from her family. MOB recalled that her symptoms lasted "a long time" and was unable to recall when her symptoms resolved. MOB recalls meeting with a therapist virtually but did not find it helpful at the time.  CSW inquired about mental health symptoms during pregnancy. MOB reports she last experienced a depressive episode about 2 weeks ago due to the stress of working full time, going to school full time, and caring for her 60 year old son while pregnant. MOB shared that her decision was seen by others as spontaneous and she feels somewhat regretful for quitting abruptly. MOB shared that she had planned to take time off from work after giving birth so also feels that her decision to quit her job was somewhat justified. MOB reports that she started taking Zoloft through her OBGYN about 1 month ago and feels it is helping manage symptoms of depression and anxiety. MOB reports she was supposed to begin virtual this past Thursday with a virtual platform, [employer] but missed her appointment due to being induced. MOB reports she has rescheduled her therapy appointment for this coming  Thursday, 04/02/23. CSW inquired about supports. MOB identified her mother and FOB as supports. MOB also shared her Beulah Gandy will be doing a postpartum visit. CSW assessed for safety. MOB denied current SI/HI/DV. MOB did not display any acute mental health signs/symptoms during consult.  CSW provided education regarding the baby blues period vs. perinatal mood disorders, discussed treatment and gave resources for mental health follow up if concerns arise.  CSW recommends self-evaluation during the postpartum time period using the New Mom Checklist from Postpartum Progress and encouraged MOB to contact a medical professional if symptoms are noted at any time.    CSW inquired if MOB has all needed baby items. MOB shared her baby shower was supposed to be today and is still working on getting a car seat due to baby being born earlier than anticipated. MOB shares that her father is supposed to be getting MOB a car seat but shared that he has been sick.  MOB reports she has clothing, a crib, and bottles for infant. CSW explained hospital car seat program if MOB has exhausted all other options and instructed MOB to notify her RN if she is still in need of a car seat at discharge. MOB expressed appreciation and understanding. CSW inquired about a pediatrician for infant. MOB shared that she will likely choose [facility] for infant but is still undecided. CSW provided a pediatrician list per MOB's request. CSW inquired about transportation barriers. MOB reports that her car is not working and she plans to use Medicaid transportation, ask her mother for assistance, or use an Pharmacist, community. MOB shares that she has also used the bus in the past but prefers not to while infant is so young.  MOB reports she receives food stamps and is interested in [facility]. CSW placed a [facility] referral per MOB's request and provided MOB with WIC contact information. CSW inquired about additional resource needs, MOB denied additional needs at this  time.  CSW provided review of Sudden Infant Death Syndrome (SIDS) precautions.    CSW identifies no further need for intervention and no barriers to discharge at this time.   CSW Plan/Description:  No Further Intervention Required/No Barriers to Discharge, Sudden Infant Death Syndrome (SIDS) Education, Perinatal Mood and Anxiety Disorder (PMADs) Education, Other Information/Referral to [employer] K Victor, LCSWA 03/29/2023, 3:53 PM

## 2023-03-29 NOTE — Lactation Note (Signed)
 This note was copied from a baby's chart. Lactation Consultation Note  Patient Name: Kim Crosby Today's Date: 03/29/2023 Age:25 hours, ETI and MOB with hx of GDM, see results review infant with low blood sugar of : 39, 38 and recent blood sugar of 54 g/ dl, lab was doing repeat blood sugar while LC in the room.   LC unable assist with latching infant at the breast  due to infant recently receiving 16 mls of 20 kcal formula. MOB is currently breast and formula feeding infant. MOB concern infant is not sustaining his latch would like further latch assistance. MOB would like to be set up with DEBP to help stimulate her milk supply as she work towards infant latching well at the breast. MOB will continue to latch infant first every feeding, every 2-3 hours, skin to skin. If infant does not latch she will continue to supplement infant with formula. LC services with follow up with family in the morning.     Maternal Data    Feeding Nipple Type: Slow - flow  LATCH Score                    Lactation Tools Discussed/Used    Interventions    Discharge    Consult Status      [name] 03/29/2023, 12:31 AM

## 2023-03-29 NOTE — Lactation Note (Signed)
 This note was copied from a baby's chart. Lactation Consultation Note  Patient Name: Kim Crosby [id number] Date: 03/29/2023  Reason for consult: Follow-up assessment;Early term 37-38.6wks  P2, 37 wks, @ 27 hrs of life. @ LC arrival- mom feeding in football positioning on left breast. Demonstrated breaking latch and re-latching as needed. Infant completes 30 minutes of breastfeeding. DEBP set up for mom @ her request. Demonstrated set-up, running pump cycle for mom- Mom completed cycle on pump -while LC feeds Donor milk . Encouraged mom pumping will help bring in her milk supply and should be completed every time baby supplements to protect her milk supply coming in. Highlighted cluster feeding expected overnight.  Maternal Data    Feeding Mother's Current Feeding Choice: Breast Milk and Formula Nipple Type: Slow - flow  LATCH Score Latch: Grasps breast easily, tongue down, lips flanged, rhythmical sucking.  Audible Swallowing: Spontaneous and intermittent  Type of Nipple: Everted at rest and after stimulation  Comfort (Breast/Nipple): Soft / non-tender  Hold (Positioning): Assistance needed to correctly position infant at breast and maintain latch.  LATCH Score: 9   Lactation Tools Discussed/Used Tools: Pump;Hands-free pumping top Breast pump type: Double-Electric Breast Pump Pump Education: Setup, frequency, and cleaning;Milk Storage Reason for Pumping: Breast stimulation/ bring in milk supply Pumping frequency: Every 3 hrs, if baby supplements  Interventions Interventions: Breast feeding basics reviewed;Assisted with latch;Hand express;Breast compression;DEBP;Education  Discharge Pump: Personal;Manual  Consult Status Consult Status: Follow-up Date: 03/30/23 Follow-up type: In-patient    [facility] 03/29/2023, 5:19 PM

## 2023-03-29 NOTE — Lactation Note (Signed)
 This note was copied from a baby's chart. Lactation Consultation Note  Patient Name: Kim Crosby [id number] Date: 03/29/2023 Age:25 hours Reason for consult: Follow-up assessment;Mother's request;Difficult latch, ETI and MOB with hx of GDM,  MO latched infant on her left breast with pillow support using the cross cradle hold, infant sustained his latch and BF for 8 minutes, then became sleepy, infant was supplemented with 15 mls of donor breast milk. MOB declined again using DEBP until in the morning, she is tired. MOB will continue to latch infant first every feeding, by cues , on demand, every 2-3 hours, skin to skin. MOB will ask for further latch assistance and MOB will continue to supplement infant with donor breast milk until blood glucose levels are within normal limits. Recently blood glucose level was 42 mg/ dl and infant is jittery. RN will inform Pediatrician. LC services will follow up with family in the morning.   Maternal Data    Feeding Mother's Current Feeding Choice: Breast Milk and Donor Milk Nipple Type: Slow - flow  LATCH Score Latch: Grasps breast easily, tongue down, lips flanged, rhythmical sucking.  Audible Swallowing: A few with stimulation  Type of Nipple: Everted at rest and after stimulation (short shafted)  Comfort (Breast/Nipple): Soft / non-tender  Hold (Positioning): Assistance needed to correctly position infant at breast and maintain latch.  LATCH Score: 8   Lactation Tools Discussed/Used    Interventions    Discharge    Consult Status      [name] 03/29/2023, 2:19 AM

## 2023-03-30 ENCOUNTER — Other Ambulatory Visit (HOSPITAL_COMMUNITY): Payer: Self-pay

## 2023-03-30 MED ORDER — FUROSEMIDE 20 MG PO TABS
20.0000 mg | ORAL_TABLET | Freq: Every day | ORAL | 0 refills | Status: AC
  Filled 2023-03-30: qty 4, 4d supply, fill #0

## 2023-03-30 MED ORDER — POTASSIUM CHLORIDE CRYS ER 20 MEQ PO TBCR
20.0000 meq | EXTENDED_RELEASE_TABLET | Freq: Every day | ORAL | 0 refills | Status: AC
Start: 2023-03-31 — End: ?
  Filled 2023-03-30: qty 4, 4d supply, fill #0

## 2023-03-30 MED ORDER — FERROUS SULFATE 325 (65 FE) MG PO TABS
325.0000 mg | ORAL_TABLET | ORAL | 0 refills | Status: AC
  Filled 2023-03-30: qty 15, 30d supply, fill #0

## 2023-03-30 MED ORDER — IBUPROFEN 600 MG PO TABS
600.0000 mg | ORAL_TABLET | Freq: Four times a day (QID) | ORAL | 0 refills | Status: AC
Start: 2023-03-30 — End: ?
  Filled 2023-03-30: qty 30, 8d supply, fill #0

## 2023-03-30 NOTE — Lactation Note (Signed)
 This note was copied from a baby's chart. Lactation Consultation Note  Patient Name: Kim Crosby [id number] Date: 03/30/2023 Age:25 hours  Reason for consult: Follow-up assessment;Early term 37-38.6wks <6 lbs  Returned to room for follow up. Mother more alert. She attempted to breastfeed baby and baby is now sleeping on mother's chest. He took 2 ml of DBM. Instructed mother to feed her baby the remaining volume -total 25 ml. Baby eagerly bottle fed and tolerated well.   Mother receptive to [facility] assisting her with hand expression. Glistening of colostrum expressed. Mother states she did not have milk production with her other child (now 13 years old). Discussed pumping. Mother has not submitted for a breast pump with her insurance, not stork pump eligible, and she has not registered with WIC. Mother does have a hand pump for home use.    Discussed the process of milk production, "supply and demand" and the importance of breast stimulation and milk removal in order to make an optimal milk supply.   Discussed mother to breastfeed or attempt, 8-12 times in 24 hours, skin to skin and breast feed before formula feeding. If baby is not latching or she is not able to express milk, limit feeding and go supplementing with formula   If missed feedings at breast or substituting feeding with formula, advised to hand express and/or pump to remove milk from the breast.   Referral has been made to [facility] for OP Lactation Consultation appointment with mother's consent.   Feeding Mother's Current Feeding Choice: Breast Milk and Formula Nipple Type: Slow - flow  Interventions Interventions: Education  Discharge Pump: Personal;Manual  Consult Status Consult Status: Follow-up Date: 03/30/23 Follow-up type: In-patient    Christella Hartigan M 03/30/2023, 11:33 AM

## 2023-03-30 NOTE — Lactation Note (Addendum)
 This note was copied from a baby's chart. Lactation Consultation Note  Patient Name: Kim Crosby [id number] Date: 03/30/2023 Age:23 years  Reason for consult: Follow-up assessment;Early term 37-38.6wks  P2, [redacted]w[redacted]d, 3.8% weight loss  Follow LC visit to see mom of ETI. Infant has been bottle feeding well, taking 35 ml of donor breast milk. Mother says baby can latch but gets sleepy. She said she was told to give her baby 30-40 ml from the bottle first then latch baby. She reports she hasn't pumped and hasn't latched baby during the night. She said she plans on "working on that today." Requested mother call for [facility] to return to assist with breastfeeding and/ or pumping. [facility] name written on white board and informed to call the desk to ask for me.   If baby is discharged today, mother will need to transition baby form donor breast milk to formula if she is not expressing her milk for supplementation. Will develop a feeding plan with mother when she calls and suggest an OP Lactation appointment for evaluation of breastfeeding progression if mother is agreeable.   Feeding Mother's Current Feeding Choice: Breast Milk and Formula Nipple Type: Slow - flow  Interventions Interventions: Education  Discharge Pump: Personal;Manual  Consult Status Consult Status: Follow-up Date: 03/30/23 Follow-up type: In-patient    Christella Hartigan M 03/30/2023, 9:58 AM

## 2023-03-30 NOTE — Progress Notes (Signed)
   PRENATAL VISIT NOTE  Subjective:  Kim Crosby is a 23 y.o. [phone] at [redacted]w[redacted]d being seen today for ongoing prenatal care.  She is currently monitored for the following issues for this high-risk pregnancy and has Gestational diabetes mellitus in pregnancy, controlled by oral hypoglycemic drugs; Polyhydramnios affecting pregnancy in third trimester; MDD (major depressive disorder), severe (HCC); Supervision of other normal pregnancy, antepartum; History of gestational diabetes; Obesity affecting pregnancy, antepartum; Preeclampsia; and Encounter for induction of labor on their problem list.  Patient reports no complaints. Hasn't bee able to check CBGS for several weeks due to broken meter. Lapse in care. Transportation problems.  Denies HA, vision changes or epigastric pain. Contractions: Not present. Vag. Bleeding: None.  Movement: Present. Denies leaking of fluid.   The following portions of the patient's history were reviewed and updated as appropriate: allergies, current medications, past family history, past medical history, past social history, past surgical history and problem list.   Objective:   Vitals:   03/26/23 1403 03/26/23 1514  BP: (!) 146/95 (!) 128/90  Pulse: (!) 113 85  Weight: 272 lb (123.4 kg)     Fetal Status: Fetal Heart Rate (bpm): 151   Movement: Present     General:  Alert, oriented and cooperative. Patient is in no acute distress.  Skin: Skin is warm and dry. No rash noted.   Cardiovascular: Normal heart rate noted  Respiratory: Normal respiratory effort, no problems with respiration noted  Abdomen: Soft, gravid, appropriate for gestational age.  Pain/Pressure: Present     Pelvic: Cervical exam deferred        Extremities: Normal range of motion.  Edema: Mild pitting, slight indentation  Mental Status: Normal mood and affect. Normal behavior. Normal judgment and thought content.   > half of fasting CBGS elevated  Few elevated postprandial CBGS  BPP 6/8 -2  for no fetal breathing mvmt->NST recommended. Consulted w/ Dr. Donavan Foil.   I reviewed the NST and agree with the nursing assessment.  EFM: Baseline: 145 bpm, Variability: Fair (1-6 bpm), Accelerations: non-reactive. 10x10's only, and Decelerations: Absent Toco: irregular, mild  BPP now 6/10   Assessment and Plan:  Pregnancy: [id number] at [redacted]w[redacted]d 1. Supervision of other normal pregnancy, antepartum (Primary)  - Culture, beta strep (group b only) - Cervicovaginal ancillary only( [place]) - Comprehensive metabolic panel - Protein / creatinine ratio, urine - CBC - Fetal nonstress test; Future  2. Gestational diabetes mellitus (GDM), antepartum, gestational diabetes method of control unspecified - Uncontrolled but only mildly elevated. No data in last few weeks. Planned to start meds but pt is being sent to [facility] for BPP 6/10 - US Fetal BPP W/O Non Stress; Future - US Fetal BPP W/O Non Stress  3. Gestational diabetes mellitus in pregnancy, controlled by oral hypoglycemic drugs  - Fetal nonstress test; Future  4. Abnormal fetal ultrasound - MA[facility] for BPP 6/10 for prolonged monitoring.  5. [redacted] weeks gestation of pregnancy  6. Non-stress test nonreactive MA[facility] for BPP 6/10  7. GHTN - Pre-e labs, MAU  Term labor symptoms and general obstetric precautions including but not limited to vaginal bleeding, contractions, leaking of fluid and fetal movement were reviewed in detail with the patient. Please refer to After Visit Summary for other counseling recommendations.   No follow-ups on file.  Future Appointments  Date Time Provider Department Center  04/02/2023  1:15 PM Adam Phenix, MD [facility]    Dorathy Kinsman, [place]

## 2023-03-30 NOTE — Lactation Note (Signed)
 This note was copied from a baby's chart. Lactation Consultation Note  Patient Name: Kim Crosby Today's Date: 03/30/2023  Dr. Bernarda Caffey informed mother she can feed her baby Simila 20 kcal and 4 bottles taken to room.   Consult Status  complete    Omar Person 03/30/2023, 12:44 PM

## 2023-03-31 ENCOUNTER — Other Ambulatory Visit: Payer: Self-pay

## 2023-03-31 ENCOUNTER — Encounter: Payer: Medicaid Other | Admitting: Obstetrics and Gynecology

## 2023-03-31 DIAGNOSIS — O24419 Gestational diabetes mellitus in pregnancy, unspecified control: Secondary | ICD-10-CM

## 2023-04-01 ENCOUNTER — Ambulatory Visit (HOSPITAL_COMMUNITY): Payer: Self-pay

## 2023-04-01 NOTE — Lactation Note (Signed)
 This note was copied from a baby's chart. Lactation Consultation Note  Patient Name: Kim Crosby [id number] Date: 04/01/2023 Age:25 days  Reason for consult: Follow-up assessment;MD order;Early term 37-38.6wks  Referral by pediatric provider to see mother regarding breastfeeding. Mother has been formula feeding by choice and baby is taking 35-60 ml.   Mother states that breastfeeding and pumping "has been really hard for her to do because baby wants to eat a lot, she cramps when she pumps or breastfeeds, and she doesn't know how much he is getting at the breast'.   Mother has pumped 2-4 times, reports she is latching baby and giving large volumes after a brief latch, and has not contacted her insurance to get an electric pump. Mother states she wants to continue working on breastfeeding. Offered assist in room, declined and father fed baby formula. A referral has already been sent for an OP Lactation appointment referral to [facility].  Discussed engorgement and prevention and establishing and maintaining milk supply.    Discussed the process of milk production, "supply and demand" and the importance of breast stimulation and milk removal in order to make an optimal milk supply.   Discussed mother to breastfeed 8-12 times in 24 hours, skin to skin and breast feed before formula feeding.    If missed feedings at breast or substituting feeding with formula, advised to hand express and/or pump to remove milk from the breast.   Maternal Data Does the patient have breastfeeding experience prior to this delivery?: Yes How long did the patient breastfeed?: "tried" with her first  Feeding Mother's Current Feeding Choice: Breast Milk and Formula Nipple Type: Slow - flow   Interventions Interventions: Education  Discharge Discharge Education: Engorgement and breast care;Warning signs for feeding baby Pump: Manual  Consult Status Consult Status: Complete Date:  04/01/23    Omar Person 04/01/2023, 12:08 PM

## 2023-04-02 ENCOUNTER — Encounter: Payer: Medicaid Other | Admitting: Obstetrics & Gynecology

## 2023-04-02 ENCOUNTER — Encounter: Payer: Self-pay | Admitting: Lactation Services

## 2023-04-06 ENCOUNTER — Other Ambulatory Visit: Payer: Self-pay

## 2023-04-06 ENCOUNTER — Other Ambulatory Visit

## 2023-04-06 ENCOUNTER — Ambulatory Visit: Payer: Self-pay

## 2023-04-06 DIAGNOSIS — O24419 Gestational diabetes mellitus in pregnancy, unspecified control: Secondary | ICD-10-CM

## 2023-04-07 ENCOUNTER — Telehealth (HOSPITAL_COMMUNITY): Payer: Self-pay | Admitting: *Deleted

## 2023-04-07 NOTE — Telephone Encounter (Signed)
 04/07/2023  Name: Kim Crosby MRN: [id number] DOB: 04/26/2000  Reason for Call:  Transition of Care Hospital Discharge Call  Contact Status: Patient Contact Status: Complete  Language assistant needed:          Follow-Up Questions: Do You Have Any Concerns About Your Health As You Heal From Delivery?: No Do You Have Any Concerns About Your Infants Health?: No  Edinburgh Postnatal Depression Scale:  In the Past 7 Days: I have been able to laugh and see the funny side of things.: As much as I always could I have looked forward with enjoyment to things.: As much as I ever did I have blamed myself unnecessarily when things went wrong.: Yes, some of the time I have been anxious or worried for no good reason.: Yes, sometimes I have felt scared or panicky for no good reason.: No, not at all Things have been getting on top of me.: No, most of the time I have coped quite well I have been so unhappy that I have had difficulty sleeping.: Not at all I have felt sad or miserable.: Not very often I have been so unhappy that I have been crying.: Only occasionally The thought of harming myself has occurred to me.: Never Edinburgh Postnatal Depression Scale Total: 7  PHQ2-9 Depression Scale:     Discharge Follow-up: Edinburgh score requires follow up?: No Patient was advised of the following resources:: Breastfeeding Support Group, Support Group  Post-discharge interventions: Reviewed Newborn Safe Sleep Practices  Signature Deforest Hoyles, RN, 04/07/23, 1726

## 2023-04-10 ENCOUNTER — Telehealth: Payer: Self-pay | Admitting: *Deleted

## 2023-04-10 NOTE — Telephone Encounter (Signed)
 Received a voice message from 09/09/23 stating she is Kim Belton, RN with [facility] and she saw [name] home visit. States Shriley thinks she missed a blood pressure check and so thought she would call in her BP. States BP was 124/84 and she is not having any symptoms of High blood pressure and seems to be doing well.  [name]

## 2023-04-27 ENCOUNTER — Encounter: Payer: Self-pay | Admitting: Advanced Practice Midwife

## 2023-04-28 ENCOUNTER — Ambulatory Visit: Admitting: Advanced Practice Midwife

## 2023-04-28 DIAGNOSIS — O24415 Gestational diabetes mellitus in pregnancy, controlled by oral hypoglycemic drugs: Secondary | ICD-10-CM

## 2023-04-28 DIAGNOSIS — O1493 Unspecified pre-eclampsia, third trimester: Secondary | ICD-10-CM

## 2023-04-28 DIAGNOSIS — R87610 Atypical squamous cells of undetermined significance on cytologic smear of cervix (ASC-US): Secondary | ICD-10-CM

## 2023-04-28 DIAGNOSIS — F322 Major depressive disorder, single episode, severe without psychotic features: Secondary | ICD-10-CM

## 2023-04-28 DIAGNOSIS — Z8632 Personal history of gestational diabetes: Secondary | ICD-10-CM

## 2023-06-29 LAB — GLUCOSE, POCT (MANUAL RESULT ENTRY): Glucose Fasting, POC: 92 mg/dL (ref 70–99)

## 2023-06-29 NOTE — Progress Notes (Signed)
 Patient came to mobile screening at [facility]. Blood pressure within range 128/81. Fasting glucose 92. Patient declined SDOH but given PCP list.

## 2023-07-06 ENCOUNTER — Ambulatory Visit: Admitting: Obstetrics and Gynecology

## 2023-07-06 ENCOUNTER — Other Ambulatory Visit: Payer: Self-pay

## 2023-07-06 ENCOUNTER — Other Ambulatory Visit (HOSPITAL_COMMUNITY)
Admission: RE | Admit: 2023-07-06 | Discharge: 2023-07-06 | Disposition: A | Discharge status: Home / Self Care | Source: Ambulatory Visit | Attending: Obstetrics and Gynecology | Admitting: Obstetrics and Gynecology

## 2023-07-06 ENCOUNTER — Encounter: Payer: Self-pay | Admitting: Obstetrics and Gynecology

## 2023-07-06 DIAGNOSIS — F322 Major depressive disorder, single episode, severe without psychotic features: Secondary | ICD-10-CM

## 2023-07-06 DIAGNOSIS — Z113 Encounter for screening for infections with a predominantly sexual mode of transmission: Secondary | ICD-10-CM | POA: Diagnosis not present

## 2023-07-06 DIAGNOSIS — Z30430 Encounter for insertion of intrauterine contraceptive device: Secondary | ICD-10-CM

## 2023-07-06 DIAGNOSIS — Z3202 Encounter for pregnancy test, result negative: Secondary | ICD-10-CM | POA: Diagnosis not present

## 2023-07-06 DIAGNOSIS — Z124 Encounter for screening for malignant neoplasm of cervix: Secondary | ICD-10-CM | POA: Insufficient documentation

## 2023-07-06 DIAGNOSIS — Z8632 Personal history of gestational diabetes: Secondary | ICD-10-CM | POA: Diagnosis not present

## 2023-07-06 LAB — POCT PREGNANCY, URINE: Preg Test, Ur: NEGATIVE

## 2023-07-06 MED ORDER — LEVONORGESTREL 20 MCG/DAY IU IUD
1.0000 | INTRAUTERINE_SYSTEM | Freq: Once | INTRAUTERINE | Status: AC
  Administered 2023-07-06: 1 via INTRAUTERINE

## 2023-07-06 NOTE — Progress Notes (Signed)
   GYNECOLOGY PROGRESS NOTE  History:  23 y.o. [name] presents to [facility] for postpartum follow up. She delivered 03/28/23 at 37 weeks. She had a vacuum assisted vaginal delivery with a minor abrasion with no repair.  She had preeclampsia during pregnancy.  She had unprotected sex Saturday and she started her period yesterday.  She reports her mood is okay, she takes zoloft  sporadic when she remembers  The following portions of the patient's history were reviewed and updated as appropriate: allergies, current medications, past family history, past medical history, past social history, past surgical history and problem list. Last pap smear on 07/28/22 was normal, neg HRHPV with prior ASCUS and HPV pos  Health Maintenance Due  Topic Date Due   HPV VACCINES (1 - Risk 3-dose series) Never done   COVID-19 Vaccine (1 - 2024-25 season) Never done     Review of Systems:  Pertinent items are noted in HPI.   Objective:  Physical Exam Blood pressure 114/76, pulse 85, weight 298 lb (135.2 kg), last menstrual period 07/05/2023, not currently breastfeeding. VS reviewed, nursing note reviewed,  Constitutional: well developed, well nourished, no distress HEENT: normocephalic Pulm/chest wall: normal effort Abdomen: soft Neuro: alert and oriented  Skin: warm, dry Psych: affect normal Pelvic exam: Pelvic: normal appearing vulva with no masses, tenderness or lesions  VAGINA: normal appearing vagina with normal color and discharge, no lesions  CERVIX: normal appearing cervix without discharge or lesions, no CMT  Thin prep pap is done   Extremities:  No swelling or varicosities noted   IUD Insertion Procedure Note Patient identified, informed consent performed, consent signed.   Discussed risks of irregular bleeding, cramping, infection, malpositioning or misplacement of the IUD outside the uterus which may require further procedure such as laparoscopy. Time out was performed.  Urine pregnancy test  negative.  Speculum placed in the vagina.  Cervix visualized.  Cleaned with Betadine x 2.  Grasped anteriorly with a single tooth tenaculum.  Uterus sounded to 7 cm.  Mirena  IUD placed per manufacturer's recommendations.  Strings trimmed to 3 cm. Tenaculum was removed, good hemostasis noted.  Patient tolerated procedure well.   Patient was given post-procedure instructions.  She was advised to have backup contraception for one week.  Patient was also asked to check IUD strings periodically and follow up in 4 weeks for IUD check.   Assessment & Plan:  1. Postpartum examination following vaginal delivery (Primary)   2. Cervical cancer screening  - Cytology - PAP( [place])  3. Screen for STD (sexually transmitted disease)  - RPR+HBsAg+HCVAb+...  4. History of gestational diabetes  - HgB A1c  5. Encounter for insertion of Mirena  IUD See insertion note above, follow up precautions provided  - levonorgestrel  (MIRENA ) 20 MCG/DAY IUD 1 each  6. MDD (major depressive disorder), severe (HCC) DIscussed zoloft  works best when taken daily, encouraged daily use and follow up as needed   Return in 4 weeks for IUD string check   Nidia Daring, FNP

## 2023-07-06 NOTE — Patient Instructions (Signed)

## 2023-07-07 ENCOUNTER — Ambulatory Visit: Payer: Self-pay | Admitting: Obstetrics and Gynecology

## 2023-07-07 LAB — RPR+HBSAG+HCVAB+...
HIV Screen 4th Generation wRfx: NONREACTIVE
Hep C Virus Ab: NONREACTIVE
Hepatitis B Surface Ag: NEGATIVE
RPR Ser Ql: NONREACTIVE

## 2023-07-07 LAB — HEMOGLOBIN A1C
Est. average glucose Bld gHb Est-mCnc: 120 mg/dL
Hgb A1c MFr Bld: 5.8 % — ABNORMAL HIGH (ref 4.8–5.6)

## 2023-07-08 LAB — CYTOLOGY - PAP
Chlamydia: NEGATIVE
Comment: NEGATIVE
Comment: NEGATIVE
Comment: NORMAL
Diagnosis: NEGATIVE
Diagnosis: REACTIVE
Neisseria Gonorrhea: NEGATIVE
Trichomonas: NEGATIVE

## 2023-07-16 ENCOUNTER — Encounter: Payer: Self-pay | Admitting: *Deleted

## 2023-08-11 ENCOUNTER — Ambulatory Visit
Admission: RE | Admit: 2023-08-11 | Discharge: 2023-08-11 | Disposition: A | Discharge status: Home / Self Care | Source: Ambulatory Visit | Attending: Family Medicine | Admitting: Family Medicine

## 2023-08-11 VITALS — BP 108/71 | HR 88 | Temp 97.9°F | Resp 16

## 2023-08-11 DIAGNOSIS — R110 Nausea: Secondary | ICD-10-CM

## 2023-08-11 DIAGNOSIS — R42 Dizziness and giddiness: Secondary | ICD-10-CM | POA: Diagnosis not present

## 2023-08-11 LAB — POCT URINE PREGNANCY: Preg Test, Ur: NEGATIVE

## 2023-08-11 MED ORDER — ONDANSETRON 4 MG PO TBDP: 4.0000 mg | ORAL_TABLET | Freq: Three times a day (TID) | ORAL | 1 refills | Status: AC | PRN

## 2023-08-11 MED ORDER — ONDANSETRON 4 MG PO TBDP
4.0000 mg | ORAL_TABLET | Freq: Once | ORAL | Status: AC
  Administered 2023-08-11: 4 mg via ORAL

## 2023-08-11 MED ORDER — MECLIZINE HCL 25 MG PO TABS: 25.0000 mg | ORAL_TABLET | Freq: Three times a day (TID) | ORAL | 0 refills | Status: AC | PRN

## 2023-08-11 MED ORDER — MECLIZINE HCL 25 MG PO TABS
25.0000 mg | ORAL_TABLET | Freq: Once | ORAL | Status: AC
  Administered 2023-08-11: 25 mg via ORAL

## 2023-08-11 NOTE — ED Provider Notes (Signed)
 [facility]  Note:  This document was prepared using Dragon voice recognition software and may include unintentional dictation errors.  MRN: [id number] DOB: 2000/10/06  Subjective:   Kim Crosby is a 23 y.o. female presenting for nausea, dizziness, stomach ache, diarrhea, bloating, and hot flashes x 5 to 6 days.  Patient reports that she has not taken any over-the-counter medication to treat symptoms.  Patient recently had Mirena  IUD placed on June 23 and states she began having mild symptoms then but has only increased over the last month.  Patient reports that at first symptoms were mild and intermittent but now have become more severe and more frequent.  Patient denies any known exposure anyone sick with COVID, flu, gastroenteritis.  No fever, cough, sore throat, body aches, chest pain, shortness of breath, weakness.  No current facility-administered medications for this encounter.  Current Outpatient Medications:    meclizine  (ANTIVERT ) 25 MG tablet, Take 1 tablet (25 mg total) by mouth 3 (three) times daily as needed for dizziness., Disp: 30 tablet, Rfl: 0   ondansetron  (ZOFRAN -ODT) 4 MG disintegrating tablet, Take 1 tablet (4 mg total) by mouth every 8 (eight) hours as needed for nausea or vomiting., Disp: 20 tablet, Rfl: 1   ferrous sulfate  325 (65 FE) MG tablet, Take 1 tablet (325 mg total) by mouth every other day. (Patient not taking: Reported on 07/06/2023), Disp: 15 tablet, Rfl: 0   fluconazole  (DIFLUCAN ) 150 MG tablet, Take 150 mg by mouth once., Disp: , Rfl:    furosemide  (LASIX ) 20 MG tablet, Take 1 tablet (20 mg total) by mouth daily. (Patient not taking: Reported on 07/06/2023), Disp: 4 tablet, Rfl: 0   hydrocortisone cream 1 %, Apply 1 Application topically 2 (two) times daily., Disp: , Rfl:    ibuprofen  (ADVIL ) 600 MG tablet, Take 1 tablet (600 mg total) by mouth every 6 (six) hours. (Patient not taking: Reported on 07/06/2023), Disp: 30 tablet, Rfl: 0    potassium chloride  SA (KLOR-CON  M) 20 MEQ tablet, Take 1 tablet (20 mEq total) by mouth daily. (Patient not taking: Reported on 07/06/2023), Disp: 4 tablet, Rfl: 0   sertraline  (ZOLOFT ) 25 MG tablet, Take 1 tablet (25 mg total) by mouth daily., Disp: 90 tablet, Rfl: 3   Allergies  Allergen Reactions   Pork-Derived Products Other (See Comments)    Life style preference Other reaction(s): Other (See Comments) Life style preference    Past Medical History:  Diagnosis Date   Anemia    Anxiety    ASCUS with positive high risk HPV cervical 03/16/2022   Bipolar 2 disorder (HCC)    Depression    Gestational diabetes    Retinitis      Past Surgical History:  Procedure Laterality Date   INDUCED ABORTION     REFRACTIVE SURGERY Bilateral     Family History  Problem Relation Age of Onset   Diabetes Mother    Gestational diabetes Mother        At younger age   Asthma Father    Diabetes Maternal Grandmother    Kidney disease Maternal Grandmother    Gestational diabetes Maternal Grandmother        When younger   Hypertension Paternal Grandfather    Cancer Neg Hx     Social History   Tobacco Use   Smoking status: Never   Smokeless tobacco: Never  Vaping Use   Vaping status: Never Used  Substance Use Topics   Alcohol use: No  Drug use: Not Currently    Types: Marijuana    Comment: last use aug 2024    ROS Refer to HPI for ROS details.  Objective:    Vitals: BP 108/71 (BP Location: Right Arm)   Pulse 88   Temp 97.9 F (36.6 C) (Oral)   Resp 16   SpO2 94%   Breastfeeding No   Physical Exam Vitals and nursing note reviewed.  Constitutional:      General: She is not in acute distress.    Appearance: Normal appearance. She is well-developed. She is not ill-appearing or toxic-appearing.  HENT:     Head: Normocephalic and atraumatic.     Mouth/Throat:     Mouth: Mucous membranes are moist.     Pharynx: Oropharynx is clear.  Eyes:     General:        Right  eye: No discharge.        Left eye: No discharge.     Extraocular Movements: Extraocular movements intact.     Conjunctiva/sclera: Conjunctivae normal.  Cardiovascular:     Rate and Rhythm: Normal rate.  Pulmonary:     Effort: Pulmonary effort is normal. No respiratory distress.  Skin:    General: Skin is warm and dry.  Neurological:     General: No focal deficit present.     Mental Status: She is alert and oriented to person, place, and time.     Cranial Nerves: No cranial nerve deficit.     Sensory: No sensory deficit.     Motor: No weakness.     Coordination: Coordination normal.     Gait: Gait normal.  Psychiatric:        Mood and Affect: Mood normal.        Behavior: Behavior normal.     Procedures  Results for orders placed or performed during the hospital encounter of 08/11/23 (from the past 24 hours)  POCT urine pregnancy     Status: None   Collection Time: 08/11/23 11:12 AM  Result Value Ref Range   Preg Test, Ur Negative Negative    Assessment and Plan :     Discharge Instructions       1. Nausea without vomiting (Primary) - POCT urine pregnancy performed in UC is negative for pregnancy - ondansetron  (ZOFRAN -ODT) disintegrating tablet 8 mg given in UC for acute nausea - ondansetron  (ZOFRAN -ODT) 4 MG disintegrating tablet; Take 1 tablet (4 mg total) by mouth every 8 (eight) hours as needed for nausea or vomiting.  Dispense: 20 tablet; Refill: 1  2. Vertigo - meclizine  (ANTIVERT ) tablet 25 mg given in UC for acute dizziness - meclizine  (ANTIVERT ) 25 MG tablet; Take 1 tablet (25 mg total) by mouth 3 (three) times daily as needed for dizziness.  Dispense: 30 tablet; Refill: 0 - Follow-up with gynecology for further evaluation and management of possible medication side effect to Mirena . - Continue to monitor symptoms if there is any change in severity of symptoms or development of new symptoms follow-up for further evaluation management.      Esaias Cleavenger B  Kim Crosby   Kim Crosby, [place], [place] 08/11/23 [phone]

## 2023-08-11 NOTE — Discharge Instructions (Signed)
  1. Nausea without vomiting (Primary) - POCT urine pregnancy performed in UC is negative for pregnancy - ondansetron  (ZOFRAN -ODT) disintegrating tablet 8 mg given in UC for acute nausea - ondansetron  (ZOFRAN -ODT) 4 MG disintegrating tablet; Take 1 tablet (4 mg total) by mouth every 8 (eight) hours as needed for nausea or vomiting.  Dispense: 20 tablet; Refill: 1  2. Vertigo - meclizine  (ANTIVERT ) tablet 25 mg given in UC for acute dizziness - meclizine  (ANTIVERT ) 25 MG tablet; Take 1 tablet (25 mg total) by mouth 3 (three) times daily as needed for dizziness.  Dispense: 30 tablet; Refill: 0 - Follow-up with gynecology for further evaluation and management of possible medication side effect to Mirena . - Continue to monitor symptoms if there is any change in severity of symptoms or development of new symptoms follow-up for further evaluation management.

## 2023-08-11 NOTE — ED Triage Notes (Addendum)
 Pt c/o dizziness, nausea, stomach pain, loose stools, bloating and feeling hot.  Pt states she got a mirena   IUD place June 23rd and symptoms began after that and have been intermittent but becoming more frequent.

## 2023-08-20 ENCOUNTER — Telehealth: Payer: Self-pay | Admitting: Obstetrics and Gynecology

## 2023-08-20 NOTE — Telephone Encounter (Signed)
 Patient called to say she needed to reschedule her appointment because she did not have anymore time off. Informed the patient we may not have anything available till the end of September, early October. She stated that was fine, then requested to see anybody. I told her all our providers are booked out that far. She stated she would call back later to schedule.

## 2023-08-21 ENCOUNTER — Ambulatory Visit: Admitting: Obstetrics and Gynecology

## 2023-12-25 ENCOUNTER — Ambulatory Visit: Payer: Self-pay | Admitting: *Deleted

## 2023-12-25 ENCOUNTER — Emergency Department (HOSPITAL_COMMUNITY)
Admission: EM | Admit: 2023-12-25 | Discharge: 2023-12-25 | Disposition: A | Discharge status: Home / Self Care | Attending: Emergency Medicine | Admitting: Emergency Medicine

## 2023-12-25 ENCOUNTER — Emergency Department (HOSPITAL_COMMUNITY)

## 2023-12-25 DIAGNOSIS — J40 Bronchitis, not specified as acute or chronic: Secondary | ICD-10-CM | POA: Insufficient documentation

## 2023-12-25 LAB — D-DIMER, QUANTITATIVE: D-Dimer, Quant: <0.27 ug{FEU}/mL (ref 0.00–0.50)

## 2023-12-25 LAB — BASIC METABOLIC PANEL WITH GFR
Anion gap: 10 (ref 5–15)
BUN: 13 mg/dL (ref 6–20)
CO2: 27 mmol/L (ref 22–32)
Calcium: 9.2 mg/dL (ref 8.9–10.3)
Chloride: 103 mmol/L (ref 98–111)
Creatinine, Ser: 0.66 mg/dL (ref 0.44–1.00)
GFR, Estimated: >60 mL/min (ref >60)
Glucose, Bld: 98 mg/dL (ref 70–99)
Potassium: 4.2 mmol/L (ref 3.5–5.1)
Sodium: 140 mmol/L (ref 135–145)

## 2023-12-25 LAB — CBC
HCT: 40 % (ref 36.0–46.0)
Hemoglobin: 13 g/dL (ref 12.0–15.0)
MCH: 27 pg (ref 26.0–34.0)
MCHC: 32.5 g/dL (ref 30.0–36.0)
MCV: 83.2 fL (ref 80.0–100.0)
Platelets: 389 K/uL (ref 150–400)
RBC: 4.81 MIL/uL (ref 3.87–5.11)
RDW: 15.9 % — ABNORMAL HIGH (ref 11.5–15.5)
WBC: 9.6 K/uL (ref 4.0–10.5)
nRBC: 0 % (ref 0.0–0.2)

## 2023-12-25 LAB — HCG, SERUM, QUALITATIVE: Preg, Serum: NEGATIVE

## 2023-12-25 LAB — TROPONIN T, HIGH SENSITIVITY: Troponin T High Sensitivity: <15 ng/L (ref 0–19)

## 2023-12-25 MED ORDER — ACETAMINOPHEN 500 MG PO TABS
1000.0000 mg | ORAL_TABLET | Freq: Once | ORAL | Status: AC
  Administered 2023-12-25: 1000 mg via ORAL
  Filled 2023-12-25: qty 2

## 2023-12-25 MED ORDER — PROMETHAZINE-DM 6.25-15 MG/5ML PO SYRP: 5.0000 mL | ORAL_SOLUTION | Freq: Four times a day (QID) | ORAL | 0 refills | Status: AC | PRN

## 2023-12-25 MED ORDER — KETOROLAC TROMETHAMINE 15 MG/ML IJ SOLN
15.0000 mg | Freq: Once | INTRAMUSCULAR | Status: AC
  Administered 2023-12-25: 15 mg via INTRAVENOUS
  Filled 2023-12-25: qty 1

## 2023-12-25 MED ORDER — PROMETHAZINE-DM 6.25-15 MG/5ML PO SYRP: 5.0000 mL | ORAL_SOLUTION | Freq: Four times a day (QID) | ORAL | 0 refills | Status: DC | PRN

## 2023-12-25 NOTE — ED Provider Notes (Signed)
 [place] EMERGENCY DEPARTMENT AT [facility] Provider Note   CSN: [id number] Arrival date & time: 12/25/23  1243     Patient presents with: Cough and Hemoptysis   Kim Crosby is a 23 y.o. female.    Cough  Patient is a 23 year old female with past medical history significant for bipolar, anemia, depression, anxiety  Patient presents emergency room today with complaints of a thin streak of bright red blood in sputum today.  She states that she has been coughing for the past few days she has had some sinus congestion she denies any persistent chest pain but states that she does have some chest discomfort when she coughs occasionally.  No nausea or vomiting no abdominal pain fevers lightheadedness or dizziness.  No recent surgeries, hospitalization, long travel, hemoptysis, estrogen containing OCP, cancer history.  No unilateral leg swelling.  No history of PE or VTE.      Prior to Admission medications  Medication Sig Start Date End Date Taking? Authorizing Provider  ferrous sulfate  325 (65 FE) MG tablet Take 1 tablet (325 mg total) by mouth every other day. Patient not taking: Reported on 07/06/2023 03/31/23   Claudene, Virginia , CNM  fluconazole  (DIFLUCAN ) 150 MG tablet Take 150 mg by mouth once. 03/02/23   [provider]  furosemide  (LASIX ) 20 MG tablet Take 1 tablet (20 mg total) by mouth daily. Patient not taking: Reported on 07/06/2023 03/31/23   Claudene, Virginia , CNM  hydrocortisone cream 1 % Apply 1 Application topically 2 (two) times daily.    [provider]  ibuprofen  (ADVIL ) 600 MG tablet Take 1 tablet (600 mg total) by mouth every 6 (six) hours. Patient not taking: Reported on 07/06/2023 03/30/23   Claudene, Virginia , CNM  meclizine  (ANTIVERT ) 25 MG tablet Take 1 tablet (25 mg total) by mouth 3 (three) times daily as needed for dizziness. 08/11/23   Reddick, Johnathan B, NP  ondansetron  (ZOFRAN -ODT) 4 MG disintegrating tablet Take 1 tablet (4 mg  total) by mouth every 8 (eight) hours as needed for nausea or vomiting. 08/11/23   Reddick, Johnathan B, NP  potassium chloride  SA (KLOR-CON  M) 20 MEQ tablet Take 1 tablet (20 mEq total) by mouth daily. Patient not taking: Reported on 07/06/2023 03/31/23   Claudene, Virginia , CNM  promethazine -dextromethorphan (PROMETHAZINE -DM) 6.25-15 MG/5ML syrup Take 5 mLs by mouth 4 (four) times daily as needed for cough. 12/25/23   Neldon Hamp RAMAN, PA  sertraline  (ZOLOFT ) 25 MG tablet Take 1 tablet (25 mg total) by mouth daily. 02/27/23   Cresenzo, John V, MD    Allergies: Porcine (pork) protein-containing drug products    Review of Systems  Respiratory:  Positive for cough.     Updated Vital Signs BP 137/86   Pulse 69   Temp 98.7 F (37.1 C)   Resp 18   LMP 12/22/2023   SpO2 100%   Physical Exam Vitals and nursing note reviewed.  Constitutional:      General: She is not in acute distress. HENT:     Head: Normocephalic and atraumatic.     Nose: Nose normal.     Mouth/Throat:     Mouth: Mucous membranes are moist.  Eyes:     General: No scleral icterus. Cardiovascular:     Rate and Rhythm: Normal rate and regular rhythm.     Pulses: Normal pulses.     Heart sounds: Normal heart sounds.     Comments: Normal rate and regular rhythm Pulmonary:     Effort:  Pulmonary effort is normal. No respiratory distress.     Breath sounds: No wheezing.  Abdominal:     Palpations: Abdomen is soft.     Tenderness: There is no abdominal tenderness.  Musculoskeletal:     Cervical back: Normal range of motion.     Right lower leg: No edema.     Left lower leg: No edema.  Skin:    General: Skin is warm and dry.     Capillary Refill: Capillary refill takes less than 2 seconds.  Neurological:     Mental Status: She is alert. Mental status is at baseline.  Psychiatric:        Mood and Affect: Mood normal.        Behavior: Behavior normal.     (all labs ordered are listed, but only abnormal results are  displayed) Labs Reviewed  CBC - Abnormal; Notable for the following components:      Result Value   RDW 15.9 (*)    All other components within normal limits  BASIC METABOLIC PANEL WITH GFR  HCG, SERUM, QUALITATIVE  D-DIMER, QUANTITATIVE  TROPONIN T, HIGH SENSITIVITY    EKG: None  Radiology: DG Chest 2 View Result Date: 12/25/2023 CLINICAL DATA:  Blood in sputum.  Coughing. EXAM: CHEST - 2 VIEW COMPARISON:  01/11/2023 FINDINGS: The lungs are clear without focal pneumonia, edema, pneumothorax or pleural effusion. The cardiopericardial silhouette is within normal limits for size. No acute bony abnormality. IMPRESSION: No active cardiopulmonary disease. Electronically Signed   By: Camellia Candle M.D.   On: 12/25/2023 13:39     Procedures   Medications Ordered in the ED  acetaminophen  (TYLENOL ) tablet 1,000 mg (1,000 mg Oral Given 12/25/23 1827)  ketorolac (TORADOL) 15 MG/ML injection 15 mg (15 mg Intravenous Given 12/25/23 2132)                                    Medical Decision Making Amount and/or Complexity of Data Reviewed Labs: ordered. Radiology: ordered.  Risk OTC drugs. Prescription drug management.   This patient presents to the ED for concern of CP, this involves a number of treatment options, and is a complaint that carries with it a moderate risk of complications and morbidity. A differential diagnosis was considered for the patient's symptoms which is discussed below:   The emergent causes of chest pain include: Acute coronary syndrome, tamponade, pericarditis/myocarditis, aortic dissection, pulmonary embolism, tension pneumothorax, pneumonia, and esophageal rupture.   I do not believe the patient has an emergent cause of chest pain, other urgent/non-acute considerations include, but are not limited to: chronic angina, aortic stenosis, cardiomyopathy, mitral valve prolapse, pulmonary hypertension, aortic insufficiency, right ventricular hypertrophy, pleuritis,  bronchitis, pneumothorax, tumor, gastroesophageal reflux disease (GERD), esophageal spasm, Mallory-Weiss syndrome, peptic ulcer disease, pancreatitis, functional gastrointestinal pain, cervical or thoracic disk disease or arthritis, shoulder arthritis, costochondritis, subacromial bursitis, anxiety or panic attack, herpes zoster, breast disorders, chest wall tumors, thoracic outlet syndrome, mediastinitis.    Co morbidities: Discussed in HPI   Brief History:  Patient is a 23 year old female with past medical history significant for bipolar, anemia, depression, anxiety  Patient presents emergency room today with complaints of a thin streak of bright red blood in sputum today.  She states that she has been coughing for the past few days she has had some sinus congestion she denies any persistent chest pain but states that she does have some chest  discomfort when she coughs occasionally.  No nausea or vomiting no abdominal pain fevers lightheadedness or dizziness.  No recent surgeries, hospitalization, long travel, hemoptysis, estrogen containing OCP, cancer history.  No unilateral leg swelling.  No history of PE or VTE.     EMR reviewed including pt PMHx, past surgical history and past visits to ER.   See HPI for more details   Lab Tests:   I ordered and independently interpreted labs. Labs notable for Troponin undetectable, D-dimer undetectable and labs unremarkable.  Imaging Studies:  Chest x-ray without infiltrate or pneumothorax    Cardiac Monitoring:  The patient was maintained on a cardiac monitor.  I personally viewed and interpreted the cardiac monitored which showed an underlying rhythm of: NSR EKG non-ischemic   Medicines ordered:  I ordered medication including Tylenol , Toradol for pain Reevaluation of the patient after these medicines showed that the patient resolved I have reviewed the patients home medicines and have made adjustments as needed   Critical  Interventions:     Consults/Attending Physician      Reevaluation:  After the interventions noted above I re-evaluated patient and found that they have :resolved   Social Determinants of Health:      Problem List / ED Course:  Patient with cough and some hemoptysis negative D-dimer she is no unilateral or bilateral leg swelling no hypoxia is not tachycardic, exam also triage heart rate was elevated.  I am reassured by workup and physical exam.  I suspect that she has bronchitis will prescribe her Promethazine  DM cough syrup and recommend Tylenol  ibuprofen .  She is symptom-free at this time.   Dispostion:  After consideration of the diagnostic results and the patients response to treatment, I feel that the patent would benefit from outpatient follow-up.        Final diagnoses:  Bronchitis    ED Discharge Orders          Ordered    promethazine -dextromethorphan (PROMETHAZINE -DM) 6.25-15 MG/5ML syrup  4 times daily PRN,   Status:  Discontinued        12/25/23 2031    promethazine -dextromethorphan (PROMETHAZINE -DM) 6.25-15 MG/5ML syrup  4 times daily PRN        12/25/23 2032               Neldon Hamp RAMAN, [place] 12/25/23 2158    Bari Roxie HERO, DO 12/25/23 2248

## 2023-12-25 NOTE — Telephone Encounter (Signed)
 Copied from CRM #[id number]. Topic: Clinical - Red Word Triage >> Dec 25, 2023 10:04 AM Dedra B wrote: [facility] that prompted transfer to Nurse Triage: Pt has been coughing up bloody mucous and experiencing through irritation and nausea. She would like to establish with Dr. Coralie at [facility]. Warm transfer to NT. Reason for Disposition  Patient sounds very sick or weak to the triager  Answer Assessment - Initial Assessment Questions No PCP. Recommended ED due to sx of chest heaviness and coughing up mucus with bright red blood in center x 2 . No difficulty breathing. Nausea and dizziness persist for a while. Unsure patient will go to ED. New patient appt scheduled per request at [facility] 02/08/24.       1. ONSET: When did the cough begin?      Last week 2. SEVERITY: How bad is the cough today?      Not bad.  3. SPUTUM: Describe the color of your sputum (e.g., none, dry cough; clear, white, yellow, green)     Yellow  4. HEMOPTYSIS: Are you coughing up any blood? If Yes, ask: How much? (e.g., flecks, streaks, tablespoons, etc.)     Yes , streaks of blood and in middle of mucus bright red.  5. DIFFICULTY BREATHING: Are you having difficulty breathing? If Yes, ask: How bad is it? (e.g., mild, moderate, severe)      Denies  6. FEVER: Do you have a fever? If Yes, ask: What is your temperature, how was it measured, and when did it start?     no 7. CARDIAC HISTORY: Do you have any history of heart disease? (e.g., heart attack, congestive heart failure)      na 8. LUNG HISTORY: Do you have any history of lung disease?  (e.g., pulmonary embolus, asthma, emphysema)     Na  9. PE RISK FACTORS: Do you have a history of blood clots? (or: recent major surgery, recent prolonged travel, bedridden)     na 10. OTHER SYMPTOMS: Do you have any other symptoms? (e.g., runny nose, wheezing, chest pain)       Chest feels weighted heavy. No difficulty  breathing. Sore throat better, irritation, nausea better than last week. No fever, dizziness on and off. Weakness/ feels tired at times. Hard to cough up mucus. Noted yellow mucus with bright red blood in center of mucus couple of times.  11. PREGNANCY: Is there any chance you are pregnant? When was your last menstrual period?       On birth control. Not pregnant. Just started period  50. TRAVEL: Have you traveled out of the country in the last month? (e.g., travel history, exposures)       na  Protocols used: Cough - Acute Productive-A-AH

## 2023-12-25 NOTE — Discharge Instructions (Signed)
 Viral Illness TREATMENT  Treatment is directed at relieving symptoms. There is no cure. Antibiotics are not effective, because the infection is caused by a virus, not by bacteria. Treatment may include:  Increased fluid intake. Sports drinks offer valuable electrolytes, sugars, and fluids.  Breathing heated mist or steam (vaporizer or shower).  Eating chicken soup or other clear broths, and maintaining good nutrition.  Getting plenty of rest.  Using gargles or lozenges for comfort.  Increasing usage of your inhaler if you have asthma.  Return to work when your temperature has returned to normal.  Gargle warm salt water and spit it out for sore throat. Take benadryl to decrease sinus secretions. Continue to alternate between Tylenol and ibuprofen for pain and fever control.  Follow Up: Follow up with your primary care doctor in 5-7 days for recheck of ongoing symptoms.  Return to emergency department for emergent changing or worsening of symptoms.

## 2023-12-25 NOTE — ED Triage Notes (Signed)
 Patient in today reporting blood in sputum when coughing today.

## 2024-02-08 ENCOUNTER — Ambulatory Visit: Admitting: Family Medicine

## 2024-03-16 ENCOUNTER — Ambulatory Visit: Admitting: Family Medicine
